# Patient Record
Sex: Female | Born: 1990
Health system: Southern US, Community
[De-identification: ages and names within clinical notes are randomized; demographics above are authoritative.]

## PROBLEM LIST (undated history)

## (undated) DIAGNOSIS — I499 Cardiac arrhythmia, unspecified: Secondary | ICD-10-CM

## (undated) DIAGNOSIS — F32A Depression, unspecified: Secondary | ICD-10-CM

## (undated) DIAGNOSIS — E282 Polycystic ovarian syndrome: Secondary | ICD-10-CM

## (undated) DIAGNOSIS — E119 Type 2 diabetes mellitus without complications: Secondary | ICD-10-CM

## (undated) DIAGNOSIS — D649 Anemia, unspecified: Secondary | ICD-10-CM

## (undated) DIAGNOSIS — E079 Disorder of thyroid, unspecified: Secondary | ICD-10-CM

## (undated) DIAGNOSIS — F419 Anxiety disorder, unspecified: Secondary | ICD-10-CM

## (undated) DIAGNOSIS — K219 Gastro-esophageal reflux disease without esophagitis: Secondary | ICD-10-CM

## (undated) DIAGNOSIS — R002 Palpitations: Secondary | ICD-10-CM

## (undated) DIAGNOSIS — E039 Hypothyroidism, unspecified: Secondary | ICD-10-CM

## (undated) DIAGNOSIS — R12 Heartburn: Secondary | ICD-10-CM

## (undated) DIAGNOSIS — S83519A Sprain of anterior cruciate ligament of unspecified knee, initial encounter: Secondary | ICD-10-CM

## (undated) DIAGNOSIS — R51 Headache: Secondary | ICD-10-CM

## (undated) HISTORY — DX: Palpitations: R00.2

## (undated) HISTORY — DX: Hypothyroidism, unspecified: E03.9

## (undated) HISTORY — DX: Depression, unspecified: F32.A

## (undated) HISTORY — DX: Type 2 diabetes mellitus without complications: E11.9

## (undated) HISTORY — DX: Disorder of thyroid, unspecified: E07.9

## (undated) HISTORY — PX: WISDOM TOOTH EXTRACTION: SHX21

## (undated) HISTORY — DX: Anxiety disorder, unspecified: F41.9

## (undated) HISTORY — DX: Heartburn: R12

---
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
## 2012-01-04 ENCOUNTER — Encounter: Payer: Self-pay | Admitting: *Deleted

## 2012-01-04 ENCOUNTER — Ambulatory Visit (INDEPENDENT_AMBULATORY_CARE_PROVIDER_SITE_OTHER): Payer: Medicaid Other | Admitting: Internal Medicine

## 2012-01-04 ENCOUNTER — Telehealth: Payer: Self-pay | Admitting: Internal Medicine

## 2012-01-04 ENCOUNTER — Encounter: Payer: Self-pay | Admitting: Internal Medicine

## 2012-01-04 DIAGNOSIS — G809 Cerebral palsy, unspecified: Secondary | ICD-10-CM

## 2012-01-04 DIAGNOSIS — Z8249 Family history of ischemic heart disease and other diseases of the circulatory system: Secondary | ICD-10-CM

## 2012-01-04 DIAGNOSIS — R079 Chest pain, unspecified: Secondary | ICD-10-CM | POA: Insufficient documentation

## 2012-01-04 DIAGNOSIS — R002 Palpitations: Secondary | ICD-10-CM

## 2012-01-04 DIAGNOSIS — I2510 Atherosclerotic heart disease of native coronary artery without angina pectoris: Secondary | ICD-10-CM

## 2012-01-04 NOTE — Assessment & Plan Note (Addendum)
Chst pain appears to be musculoskeletal.  I am not convinced of cardiac component. With mothers history I would recomm an echo to rule out familial problem.  Not related to pain Mother's name:  Dimas Chyle

## 2012-01-04 NOTE — Progress Notes (Signed)
HPI Patient is a 21 year old who was referred from the ER for evaluation of CP. The patient says over the past month she has had SSCP, breast pain.Pain is rel constant, aching.  Worse with deep breath at times.  Not positional.  Not exacerbatice by exertion ER note did document bruising across chest. Impression that pain was musculoskel in origin  Rec:  Ibuprofen. Since seen she continues to have.  No signif change. No SOB with activity. Notes some palpitations.  Isolated skips.  She has cut back on caffeine.  No dizziness. No SOB with events. She denies injury to chest.  Mother with history of cardiomyopathy.  Has ICD. No Known Allergies   Current Outpatient Prescriptions  Medication Sig Dispense Refill  . ibuprofen (ADVIL,MOTRIN) 800 MG tablet Take 1 tablet (800 mg total) by mouth 3 (three) times daily.  30 tablet  0    Past Medical History  Diagnosis Date  . Migraine     No past surgical history on file.  Family History  Problem Relation Age of Onset  . Hypertension Other   . Diabetes Other   . Cardiomyopathy Other     History   Social History  . Marital Status: Single    Spouse Name: N/A    Number of Children: N/A  . Years of Education: N/A   Occupational History  . Not on file.   Social History Main Topics  . Smoking status: Never Smoker   . Smokeless tobacco: Not on file  . Alcohol Use: No  . Drug Use: No  . Sexually Active:    Other Topics Concern  . Not on file   Social History Narrative  . No narrative on file    Review of Systems:  All systems reviewed.  They are negative to the above problem except as previously stated.  Vital Signs: BP 120/74  Pulse 60  Ht 5\' 6"  (1.676 m)  Wt 216 lb (97.977 kg)  BMI 34.86 kg/m2  LMP 12/20/2011  Physical Exam Patient in NAD  HEENT:  Normocephalic, atraumatic. EOMI, PERRLA.  Neck: JVP is normal. No thyromegaly. No bruits.  Lungs: clear to auscultation. No rales no wheezes.  Heart: Regular rate and  rhythm. Normal S1, S2. No S3.   No significant murmurs. PMI not displaced. Chest:  Old bruises on breasts.  Tender to palpation of breast and substernal area.  Abdomen:  Supple, nontender. Normal bowel sounds. No masses. No hepatomegaly.  Extremities:   Good distal pulses throughout. No lower extremity edema.  Musculoskeletal :moving all extremities.  Neuro:   alert and oriented x3.  CN II-XII grossly intact.  EKG:  SB.  57.   (12/28/11)  Assessment and Plan:

## 2012-01-04 NOTE — Patient Instructions (Addendum)
Schedule ECHO Heart Ultrasound in 1 month.  Your physician has requested that you have an echocardiogram. Echocardiography is a painless test that uses sound waves to create images of your heart. It provides your doctor with information about the size and shape of your heart and how well your heart's chambers and valves are working. This procedure takes approximately one hour. There are no restrictions for this procedure.

## 2012-01-04 NOTE — Assessment & Plan Note (Signed)
Sound like isolated skips.  Agree with avoiding stimulants.

## 2012-01-04 NOTE — Telephone Encounter (Signed)
Note faxed to her school.

## 2012-01-04 NOTE — Telephone Encounter (Signed)
New msg Patient was here today and she needs a school note faxed to her school at [phone].

## 2012-01-31 ENCOUNTER — Other Ambulatory Visit (HOSPITAL_COMMUNITY): Payer: Medicaid Other

## 2012-01-31 ENCOUNTER — Ambulatory Visit (HOSPITAL_COMMUNITY): Payer: Medicaid Other | Attending: Cardiology

## 2012-01-31 ENCOUNTER — Other Ambulatory Visit: Payer: BC Managed Care – PPO

## 2012-01-31 DIAGNOSIS — R002 Palpitations: Secondary | ICD-10-CM | POA: Insufficient documentation

## 2012-01-31 DIAGNOSIS — R079 Chest pain, unspecified: Secondary | ICD-10-CM | POA: Insufficient documentation

## 2012-01-31 DIAGNOSIS — Z8249 Family history of ischemic heart disease and other diseases of the circulatory system: Secondary | ICD-10-CM

## 2012-01-31 DIAGNOSIS — I2510 Atherosclerotic heart disease of native coronary artery without angina pectoris: Secondary | ICD-10-CM

## 2012-01-31 DIAGNOSIS — R072 Precordial pain: Secondary | ICD-10-CM

## 2014-05-12 ENCOUNTER — Emergency Department (HOSPITAL_COMMUNITY): Payer: BC Managed Care – PPO

## 2014-05-12 ENCOUNTER — Emergency Department (HOSPITAL_COMMUNITY)
Admission: EM | Admit: 2014-05-12 | Discharge: 2014-05-12 | Disposition: A | Discharge status: Home / Self Care | Payer: BC Managed Care – PPO | Attending: Emergency Medicine | Admitting: Emergency Medicine

## 2014-05-12 DIAGNOSIS — M25562 Pain in left knee: Secondary | ICD-10-CM

## 2014-05-12 DIAGNOSIS — S8990XA Unspecified injury of unspecified lower leg, initial encounter: Secondary | ICD-10-CM | POA: Insufficient documentation

## 2014-05-12 DIAGNOSIS — S83519A Sprain of anterior cruciate ligament of unspecified knee, initial encounter: Secondary | ICD-10-CM

## 2014-05-12 DIAGNOSIS — S99919A Unspecified injury of unspecified ankle, initial encounter: Secondary | ICD-10-CM | POA: Insufficient documentation

## 2014-05-12 DIAGNOSIS — S99929A Unspecified injury of unspecified foot, initial encounter: Principal | ICD-10-CM

## 2014-05-12 DIAGNOSIS — Y9344 Activity, trampolining: Secondary | ICD-10-CM | POA: Insufficient documentation

## 2014-05-12 DIAGNOSIS — Z8679 Personal history of other diseases of the circulatory system: Secondary | ICD-10-CM | POA: Insufficient documentation

## 2014-05-12 DIAGNOSIS — X500XXA Overexertion from strenuous movement or load, initial encounter: Secondary | ICD-10-CM | POA: Insufficient documentation

## 2014-05-12 DIAGNOSIS — Y92838 Other recreation area as the place of occurrence of the external cause: Secondary | ICD-10-CM

## 2014-05-12 DIAGNOSIS — Y9239 Other specified sports and athletic area as the place of occurrence of the external cause: Secondary | ICD-10-CM | POA: Insufficient documentation

## 2014-05-12 DIAGNOSIS — Z8739 Personal history of other diseases of the musculoskeletal system and connective tissue: Secondary | ICD-10-CM | POA: Insufficient documentation

## 2014-05-12 HISTORY — DX: Sprain of anterior cruciate ligament of unspecified knee, initial encounter: S83.519A

## 2014-05-12 MED ORDER — HYDROCODONE-ACETAMINOPHEN 5-325 MG PO TABS: 1.0000 | ORAL_TABLET | Freq: Four times a day (QID) | ORAL | Status: DC | PRN

## 2014-05-12 MED ORDER — ONDANSETRON HCL 4 MG PO TABS: 4.0000 mg | ORAL_TABLET | Freq: Four times a day (QID) | ORAL | Status: DC

## 2014-05-12 MED ORDER — ONDANSETRON 8 MG PO TBDP
8.0000 mg | ORAL_TABLET | Freq: Once | ORAL | Status: AC
  Administered 2014-05-12: 8 mg via ORAL
  Filled 2014-05-12: qty 1

## 2014-05-12 MED ORDER — HYDROCODONE-ACETAMINOPHEN 5-325 MG PO TABS
2.0000 | ORAL_TABLET | Freq: Once | ORAL | Status: AC
  Administered 2014-05-12: 2 via ORAL
  Filled 2014-05-12: qty 2

## 2014-05-12 NOTE — Discharge Instructions (Signed)
Knee Pain °Knee pain can be a result of an injury or other medical conditions. Treatment will depend on the cause of your pain. °HOME CARE °· Only take medicine as told by your doctor. °· Keep a healthy weight. Being overweight can make the knee hurt more. °· Stretch before exercising or playing sports. °· If there is constant knee pain, change the way you exercise. Ask your doctor for advice. °· Make sure shoes fit well. Choose the right shoe for the sport or activity. °· Protect your knees. Wear kneepads if needed. °· Rest when you are tired. °GET HELP RIGHT AWAY IF:  °· Your knee pain does not stop. °· Your knee pain does not get better. °· Your knee joint feels hot to the touch. °· You have a fever. °MAKE SURE YOU:  °· Understand these instructions. °· Will watch this condition. °· Will get help right away if you are not doing well or get worse. °Document Released: 02/09/2009 Document Revised: 02/05/2012 Document Reviewed: 02/09/2009 °[employer]® Patient Information ©2015 [employer]. This information is not intended to replace advice given to you by your health care provider. Make sure you discuss any questions you have with your health care provider. ° °

## 2014-05-12 NOTE — ED Notes (Signed)
Bed: [id number] Expected date:  Expected time:  Means of arrival:  Comments: EMS knee injury

## 2014-05-12 NOTE — ED Notes (Signed)
Patient returned from radiology. Patient remains in NAD

## 2014-05-12 NOTE — ED Notes (Signed)
Patient transported to radiology via stretcher Patient in NAD upon leaving room for testing

## 2014-05-12 NOTE — ED Provider Notes (Signed)
CSN: [id number]     Arrival date & time 05/12/14  2153 History   First MD Initiated Contact with Patient 05/12/14 2155     Chief Complaint  Patient presents with  . Knee Pain     (Consider location/radiation/quality/duration/timing/severity/associated sxs/prior Treatment) HPI Comments: The patient is an otherwise healthy 23 year old female who presents today with left knee pain. She states that the pain began just prior to arrival when she was jumping on a trampoline. She jumped up and felt pain. She states she felt a pop in her left knee. She has been unable to bend her left knee since this occurred. She has been unable to ambulate since this occurred as this worsens her pain. She does have history of tendinitis in this knee. She has never had any prior surgeries on that knee. She has some tingling in the knee. She has not tried anything for pain. No loss of consciousness or any other injuries during this accident.   The history is provided by the patient. No language interpreter was used.    Past Medical History  Diagnosis Date  . Migraine    No past surgical history on file. Family History  Problem Relation Age of Onset  . Hypertension Other   . Diabetes Other   . Cardiomyopathy Other    History  Substance Use Topics  . Smoking status: Never Smoker   . Smokeless tobacco: Not on file  . Alcohol Use: No   OB History   Grav Para Term Preterm Abortions TAB SAB Ect Mult Living                 Review of Systems  Musculoskeletal: Positive for arthralgias, gait problem, joint swelling and myalgias.  Neurological: Negative for syncope.  All other systems reviewed and are negative.     Allergies  Review of patient's allergies indicates no known allergies.  Home Medications   Prior to Admission medications   Medication Sig Start Date End Date Taking? Authorizing Provider  acetaminophen (TYLENOL) 500 MG tablet Take 1,500 mg by mouth every 6 (six) hours as needed (for  pain.).   Yes Historical Provider, MD  ibuprofen (ADVIL,MOTRIN) 200 MG tablet Take 400-600 mg by mouth every 6 (six) hours as needed (for pain.).   Yes Historical Provider, MD   BP 127/63  Pulse 90  Temp(Src) 98.4 F (36.9 C) (Oral)  Resp 18  SpO2 98% Physical Exam  Nursing note and vitals reviewed. Constitutional: She is oriented to person, place, and time. She appears well-developed and well-nourished. No distress.  HENT:  Head: Normocephalic and atraumatic.  Right Ear: External ear normal.  Left Ear: External ear normal.  Nose: Nose normal.  Mouth/Throat: Oropharynx is clear and moist.  Eyes: Conjunctivae are normal.  Neck: Normal range of motion.  Cardiovascular: Normal rate, regular rhythm, normal heart sounds, intact distal pulses and normal pulses.   Pulses:      Dorsalis pedis pulses are 2+ on the right side.       Posterior tibial pulses are 2+ on the right side.  Capillary refill less than 3 seconds in all toes.  Pulmonary/Chest: Effort normal and breath sounds normal. No stridor. No respiratory distress. She has no wheezes. She has no rales.  Abdominal: Soft. She exhibits no distension.  Musculoskeletal: Normal range of motion.  Tenderness to palpation over medial, anterior, lateral aspect of the left knee. Patient keeps the knee in flexion. Patient resists any extension. Joint stable. I do not appreciate any  joint effusion. There is no bruising or abrasions. Compartment soft. Neurovascularly intact.  Neurological: She is alert and oriented to person, place, and time. She has normal strength.  Skin: Skin is warm and dry. She is not diaphoretic. No erythema.  Psychiatric: She has a normal mood and affect. Her behavior is normal.    ED Course  Procedures (including critical care time) Labs Review Labs Reviewed - No data to display  Imaging Review Dg Knee Complete 4 Views Left  05/12/2014   CLINICAL DATA:  Trampoline injury.  EXAM: LEFT KNEE - COMPLETE 4+ VIEW   COMPARISON:  None.  FINDINGS: There is no evidence of fracture, dislocation, or joint effusion. There is no evidence of arthropathy or other focal bone abnormality. Soft tissues are unremarkable.  IMPRESSION: Negative.   Electronically Signed   By: Awilda Metroourtnay  Bloomer   On: 05/12/2014 22:20     EKG Interpretation None      MDM   Final diagnoses:  Left knee pain    Patient presents to the emergency department with left knee pain after jumping on a trampoline. There is no deformity. Joints stable, compartment soft, neurovascularly intact. X-rays negative for acute findings. At this time I do not believe there is ligamentous injury. No concern for patellar subluxation. Patient was given pain medicine and instructed to rest, ice, elevate. She was given crutches and a knee immobilizer. She was given orthopedic followup as needed. A work note was provided for the patient. Discussed reasons to return to the emergency department immediately. Vital signs stable for discharge. Patient / Family / Caregiver informed of clinical course, understand medical decision-making process, and agree with plan.     Mora BellmanHannah S Veanna Dower, PA-C 05/12/14 2329

## 2014-05-12 NOTE — ED Notes (Signed)
Per PTAR: Pt was jumping on trampoline, felt a pop in L knee, pt unable to ambulate since the injury. No deformity noted. Good pedal pulses. No bleeding or bruising.

## 2014-05-12 NOTE — ED Notes (Signed)
Ortho tech at bedside 

## 2014-05-16 NOTE — ED Provider Notes (Signed)
Medical screening examination/treatment/procedure(s) were performed by non-physician practitioner and as supervising physician I was immediately available for consultation/collaboration.   EKG Interpretation None        Rolland PorterMark Monque Haggar, MD 05/16/14 0730

## 2014-06-04 ENCOUNTER — Encounter (HOSPITAL_BASED_OUTPATIENT_CLINIC_OR_DEPARTMENT_OTHER): Payer: Self-pay | Admitting: *Deleted

## 2014-06-04 ENCOUNTER — Other Ambulatory Visit: Payer: Self-pay | Admitting: Orthopedic Surgery

## 2014-06-05 ENCOUNTER — Encounter (HOSPITAL_BASED_OUTPATIENT_CLINIC_OR_DEPARTMENT_OTHER): Payer: Self-pay | Admitting: Anesthesiology

## 2014-06-05 ENCOUNTER — Ambulatory Visit (HOSPITAL_BASED_OUTPATIENT_CLINIC_OR_DEPARTMENT_OTHER)
Admission: RE | Admit: 2014-06-05 | Discharge: 2014-06-05 | Disposition: A | Discharge status: Home / Self Care | Payer: BC Managed Care – PPO | Source: Ambulatory Visit | Attending: Orthopedic Surgery | Admitting: Orthopedic Surgery

## 2014-06-05 ENCOUNTER — Encounter (HOSPITAL_BASED_OUTPATIENT_CLINIC_OR_DEPARTMENT_OTHER): Payer: BC Managed Care – PPO | Admitting: Anesthesiology

## 2014-06-05 ENCOUNTER — Ambulatory Visit (HOSPITAL_BASED_OUTPATIENT_CLINIC_OR_DEPARTMENT_OTHER): Payer: BC Managed Care – PPO | Admitting: Anesthesiology

## 2014-06-05 ENCOUNTER — Encounter (HOSPITAL_BASED_OUTPATIENT_CLINIC_OR_DEPARTMENT_OTHER)
Admission: RE | Disposition: A | Discharge status: Home / Self Care | Payer: Self-pay | Source: Ambulatory Visit | Attending: Orthopedic Surgery

## 2014-06-05 DIAGNOSIS — X58XXXA Exposure to other specified factors, initial encounter: Secondary | ICD-10-CM

## 2014-06-05 DIAGNOSIS — R51 Headache: Secondary | ICD-10-CM | POA: Insufficient documentation

## 2014-06-05 DIAGNOSIS — Y9344 Activity, trampolining: Secondary | ICD-10-CM | POA: Insufficient documentation

## 2014-06-05 DIAGNOSIS — M2240 Chondromalacia patellae, unspecified knee: Secondary | ICD-10-CM | POA: Insufficient documentation

## 2014-06-05 DIAGNOSIS — E282 Polycystic ovarian syndrome: Secondary | ICD-10-CM | POA: Insufficient documentation

## 2014-06-05 DIAGNOSIS — S83509A Sprain of unspecified cruciate ligament of unspecified knee, initial encounter: Secondary | ICD-10-CM | POA: Insufficient documentation

## 2014-06-05 DIAGNOSIS — X503XXA Overexertion from repetitive movements, initial encounter: Secondary | ICD-10-CM | POA: Insufficient documentation

## 2014-06-05 HISTORY — DX: Anemia, unspecified: D64.9

## 2014-06-05 HISTORY — DX: Sprain of anterior cruciate ligament of unspecified knee, initial encounter: S83.519A

## 2014-06-05 HISTORY — DX: Polycystic ovarian syndrome: E28.2

## 2014-06-05 HISTORY — DX: Headache: R51

## 2014-06-05 HISTORY — PX: KNEE ARTHROSCOPY WITH ANTERIOR CRUCIATE LIGAMENT (ACL) REPAIR: SHX5644

## 2014-06-05 LAB — POCT HEMOGLOBIN-HEMACUE: Hemoglobin: 13.8 g/dL (ref 12.0–15.0)

## 2014-06-05 SURGERY — KNEE ARTHROSCOPY WITH ANTERIOR CRUCIATE LIGAMENT (ACL) REPAIR
Anesthesia: General | Site: Knee | Laterality: Left

## 2014-06-05 MED ORDER — PROPOFOL 10 MG/ML IV BOLUS
INTRAVENOUS | Status: DC | PRN
  Administered 2014-06-05: 200 mg via INTRAVENOUS

## 2014-06-05 MED ORDER — CEFAZOLIN SODIUM-DEXTROSE 2-3 GM-% IV SOLR
INTRAVENOUS | Status: AC
  Filled 2014-06-05: qty 50

## 2014-06-05 MED ORDER — MIDAZOLAM HCL 2 MG/2ML IJ SOLN
INTRAMUSCULAR | Status: AC
  Filled 2014-06-05: qty 2

## 2014-06-05 MED ORDER — GLYCOPYRROLATE 0.2 MG/ML IJ SOLN
INTRAMUSCULAR | Status: DC | PRN
  Administered 2014-06-05: 0.2 mg via INTRAVENOUS

## 2014-06-05 MED ORDER — MIDAZOLAM HCL 2 MG/2ML IJ SOLN
1.0000 mg | INTRAMUSCULAR | Status: DC | PRN
  Administered 2014-06-05: 2 mg via INTRAVENOUS

## 2014-06-05 MED ORDER — MIDAZOLAM HCL 2 MG/ML PO SYRP: 12.0000 mg | ORAL_SOLUTION | Freq: Once | ORAL | Status: DC | PRN

## 2014-06-05 MED ORDER — ONDANSETRON HCL 4 MG/2ML IJ SOLN: 4.0000 mg | Freq: Once | INTRAMUSCULAR | Status: DC | PRN

## 2014-06-05 MED ORDER — EPINEPHRINE HCL 1 MG/ML IJ SOLN
INTRAMUSCULAR | Status: DC | PRN
  Administered 2014-06-05: 2 mg

## 2014-06-05 MED ORDER — FENTANYL CITRATE 0.05 MG/ML IJ SOLN
INTRAMUSCULAR | Status: DC | PRN
  Administered 2014-06-05 (×6): 25 ug via INTRAVENOUS

## 2014-06-05 MED ORDER — OXYCODONE HCL 5 MG/5ML PO SOLN: 5.0000 mg | Freq: Once | ORAL | Status: AC | PRN

## 2014-06-05 MED ORDER — FENTANYL CITRATE 0.05 MG/ML IJ SOLN
50.0000 ug | INTRAMUSCULAR | Status: DC | PRN
  Administered 2014-06-05: 100 ug via INTRAVENOUS

## 2014-06-05 MED ORDER — SODIUM CHLORIDE 0.9 % IR SOLN
Status: DC | PRN
  Administered 2014-06-05: 15000 mL

## 2014-06-05 MED ORDER — DEXAMETHASONE SODIUM PHOSPHATE 4 MG/ML IJ SOLN
INTRAMUSCULAR | Status: DC | PRN
  Administered 2014-06-05: 10 mg via INTRAVENOUS

## 2014-06-05 MED ORDER — HYDROMORPHONE HCL PF 1 MG/ML IJ SOLN
INTRAMUSCULAR | Status: AC
  Filled 2014-06-05: qty 1

## 2014-06-05 MED ORDER — FENTANYL CITRATE 0.05 MG/ML IJ SOLN
INTRAMUSCULAR | Status: AC
  Filled 2014-06-05: qty 2

## 2014-06-05 MED ORDER — OXYCODONE HCL 5 MG PO TABS
ORAL_TABLET | ORAL | Status: AC
  Filled 2014-06-05: qty 1

## 2014-06-05 MED ORDER — BUPIVACAINE HCL (PF) 0.5 % IJ SOLN
INTRAMUSCULAR | Status: AC
  Filled 2014-06-05: qty 30

## 2014-06-05 MED ORDER — LIDOCAINE HCL (CARDIAC) 20 MG/ML IV SOLN
INTRAVENOUS | Status: DC | PRN
  Administered 2014-06-05: 80 mg via INTRAVENOUS

## 2014-06-05 MED ORDER — OXYCODONE-ACETAMINOPHEN 5-325 MG PO TABS: 1.0000 | ORAL_TABLET | Freq: Four times a day (QID) | ORAL | Status: DC | PRN

## 2014-06-05 MED ORDER — BUPIVACAINE-EPINEPHRINE (PF) 0.5% -1:200000 IJ SOLN
INTRAMUSCULAR | Status: AC
  Filled 2014-06-05: qty 30

## 2014-06-05 MED ORDER — HYDROMORPHONE HCL PF 1 MG/ML IJ SOLN
0.2500 mg | INTRAMUSCULAR | Status: DC | PRN
  Administered 2014-06-05: 0.5 mg via INTRAVENOUS

## 2014-06-05 MED ORDER — CHLORHEXIDINE GLUCONATE 4 % EX LIQD: 60.0000 mL | Freq: Once | CUTANEOUS | Status: DC

## 2014-06-05 MED ORDER — CEFAZOLIN SODIUM-DEXTROSE 2-3 GM-% IV SOLR
2.0000 g | INTRAVENOUS | Status: AC
  Administered 2014-06-05: 2 g via INTRAVENOUS

## 2014-06-05 MED ORDER — OXYCODONE HCL 5 MG PO TABS
5.0000 mg | ORAL_TABLET | Freq: Once | ORAL | Status: AC | PRN
  Administered 2014-06-05: 5 mg via ORAL

## 2014-06-05 MED ORDER — FENTANYL CITRATE 0.05 MG/ML IJ SOLN
INTRAMUSCULAR | Status: AC
  Filled 2014-06-05: qty 6

## 2014-06-05 MED ORDER — ONDANSETRON HCL 4 MG/2ML IJ SOLN
INTRAMUSCULAR | Status: DC | PRN
  Administered 2014-06-05: 4 mg via INTRAVENOUS

## 2014-06-05 MED ORDER — 0.9 % SODIUM CHLORIDE (POUR BTL) OPTIME
TOPICAL | Status: DC | PRN
  Administered 2014-06-05: 1000 mL

## 2014-06-05 MED ORDER — EPINEPHRINE HCL 1 MG/ML IJ SOLN
INTRAMUSCULAR | Status: AC
  Filled 2014-06-05: qty 1

## 2014-06-05 MED ORDER — LACTATED RINGERS IV SOLN
INTRAVENOUS | Status: DC
  Administered 2014-06-05 (×2): via INTRAVENOUS

## 2014-06-05 SURGICAL SUPPLY — 77 items
APL SKNCLS STERI-STRIP NONHPOA (GAUZE/BANDAGES/DRESSINGS) ×1
APPLICATOR COTTON TIP 6IN STRL (MISCELLANEOUS) ×1 IMPLANT
BANDAGE ESMARK 6X9 LF (GAUZE/BANDAGES/DRESSINGS) IMPLANT
BENZOIN TINCTURE PRP APPL 2/3 (GAUZE/BANDAGES/DRESSINGS) ×2 IMPLANT
BLADE 4.2CUDA (BLADE) IMPLANT
BLADE AVERAGE 25X9 (BLADE) ×2 IMPLANT
BLADE CUDA 5.5 (BLADE) IMPLANT
BLADE CUTTER GATOR 3.5 (BLADE) IMPLANT
BLADE GREAT WHITE 4.2 (BLADE) ×2 IMPLANT
BLADE OSCIL/SAGITTAL W/10 ST (BLADE) IMPLANT
BLADE SURG 15 STRL LF DISP TIS (BLADE) ×1 IMPLANT
BLADE SURG 15 STRL SS (BLADE) ×2
BNDG CMPR 9X6 STRL LF SNTH (GAUZE/BANDAGES/DRESSINGS)
BNDG ESMARK 6X9 LF (GAUZE/BANDAGES/DRESSINGS)
BUR OVAL 4.0 (BURR) ×2 IMPLANT
CANISTER SUCT 3000ML (MISCELLANEOUS) IMPLANT
COVER TABLE BACK 60X90 (DRAPES) ×2 IMPLANT
DRAPE ARTHROSCOPY W/POUCH 114 (DRAPES) ×2 IMPLANT
DRSG EMULSION OIL 3X3 NADH (GAUZE/BANDAGES/DRESSINGS) ×2 IMPLANT
DURAPREP 26ML APPLICATOR (WOUND CARE) ×2 IMPLANT
ELECT MENISCUS 165MM 90D (ELECTRODE) IMPLANT
ELECT REM PT RETURN 9FT ADLT (ELECTROSURGICAL)
ELECTRODE REM PT RTRN 9FT ADLT (ELECTROSURGICAL) IMPLANT
GAUZE SPONGE 4X4 12PLY STRL (GAUZE/BANDAGES/DRESSINGS) ×2 IMPLANT
GLOVE BIO SURGEON STRL SZ7.5 (GLOVE) ×1 IMPLANT
GLOVE BIOGEL PI IND STRL 7.0 (GLOVE) IMPLANT
GLOVE BIOGEL PI IND STRL 8 (GLOVE) ×2 IMPLANT
GLOVE BIOGEL PI INDICATOR 7.0 (GLOVE) ×1
GLOVE BIOGEL PI INDICATOR 8 (GLOVE) ×2
GLOVE ECLIPSE 6.5 STRL STRAW (GLOVE) ×1 IMPLANT
GLOVE ECLIPSE 7.5 STRL STRAW (GLOVE) ×4 IMPLANT
GLOVE EXAM NITRILE PF MED BLUE (GLOVE) ×1 IMPLANT
GLOVE SURG SS PI 7.0 STRL IVOR (GLOVE) ×1 IMPLANT
GOWN STRL REUS W/ TWL LRG LVL3 (GOWN DISPOSABLE) ×1 IMPLANT
GOWN STRL REUS W/ TWL XL LVL3 (GOWN DISPOSABLE) ×1 IMPLANT
GOWN STRL REUS W/TWL LRG LVL3 (GOWN DISPOSABLE) ×2
GOWN STRL REUS W/TWL XL LVL3 (GOWN DISPOSABLE) ×5 IMPLANT
HOLDER KNEE FOAM BLUE (MISCELLANEOUS) ×2 IMPLANT
IMMOBILIZER KNEE 22 UNIV (SOFTGOODS) ×1 IMPLANT
IMMOBILIZER KNEE 24 THIGH 36 (MISCELLANEOUS) IMPLANT
IMMOBILIZER KNEE 24 UNIV (MISCELLANEOUS)
IV NS IRRIG 3000ML ARTHROMATIC (IV SOLUTION) ×2 IMPLANT
KIT TRANSTIBIAL (DISPOSABLE) ×2 IMPLANT
KNEE WRAP E Z 3 GEL PACK (MISCELLANEOUS) ×2 IMPLANT
KNIFE GRAFT ACL 10MM 5952 (MISCELLANEOUS) IMPLANT
KNIFE GRAFT ACL 11MM (MISCELLANEOUS) ×1 IMPLANT
MANIFOLD NEPTUNE II (INSTRUMENTS) ×2 IMPLANT
NDL SAFETY ECLIPSE 18X1.5 (NEEDLE) ×2 IMPLANT
NEEDLE HYPO 18GX1.5 SHARP (NEEDLE) ×4
PACK ARTHROSCOPY DSU (CUSTOM PROCEDURE TRAY) ×2 IMPLANT
PACK BASIN DAY SURGERY FS (CUSTOM PROCEDURE TRAY) ×2 IMPLANT
PAD CAST 4YDX4 CTTN HI CHSV (CAST SUPPLIES) ×2 IMPLANT
PADDING CAST COTTON 4X4 STRL (CAST SUPPLIES) ×4
PASSER SUT SWANSON 36MM LOOP (INSTRUMENTS) IMPLANT
PENCIL BUTTON HOLSTER BLD 10FT (ELECTRODE) IMPLANT
SCREW INTERFERENCE 7X20MM (Screw) ×1 IMPLANT
SCREW SHEATHED INTERF 7X25 (Screw) ×1 IMPLANT
SET ARTHROSCOPY TUBING (MISCELLANEOUS) ×2
SET ARTHROSCOPY TUBING LN (MISCELLANEOUS) ×1 IMPLANT
SHEET MEDIUM DRAPE 40X70 STRL (DRAPES) ×2 IMPLANT
SPONGE LAP 4X18 X RAY DECT (DISPOSABLE) ×2 IMPLANT
STRIP CLOSURE SKIN 1/2X4 (GAUZE/BANDAGES/DRESSINGS) ×2 IMPLANT
SUCTION FRAZIER TIP 10 FR DISP (SUCTIONS) ×2 IMPLANT
SUT ETHILON 4 0 PS 2 18 (SUTURE) IMPLANT
SUT MNCRL AB 3-0 PS2 18 (SUTURE) ×2 IMPLANT
SUT PDS AB 1 CT  36 (SUTURE) ×2
SUT PDS AB 1 CT 36 (SUTURE) ×2 IMPLANT
SUT STEEL 5 (SUTURE) ×2 IMPLANT
SUT TICRON 1 T 12 (SUTURE) ×1 IMPLANT
SUT VIC AB 0 CT1 27 (SUTURE) ×2
SUT VIC AB 0 CT1 27XBRD ANBCTR (SUTURE) IMPLANT
SUT VIC AB 2-0 SH 27 (SUTURE) ×2
SUT VIC AB 2-0 SH 27XBRD (SUTURE) IMPLANT
SYRINGE 6CC (MISCELLANEOUS) ×2 IMPLANT
TOWEL OR 17X24 6PK STRL BLUE (TOWEL DISPOSABLE) ×6 IMPLANT
TOWEL OR NON WOVEN STRL DISP B (DISPOSABLE) ×2 IMPLANT
WATER STERILE IRR 1000ML POUR (IV SOLUTION) ×3 IMPLANT

## 2014-06-05 NOTE — Discharge Instructions (Signed)
POST-OP ACL RECONSTRUCTION  1. Come to office Monday at 9 am for follow up appt  2. Leave the steri-strips in place over your incisions when performing dressing changes and showering. Remove your dressings tomorrow to perform first dressings change. Then change dressing daily or as needed. You may shower and get incision wet on day 5 from surgery. Do not submerge incision in tub or under water.  3. Wear your knee immobilizer or hinged knee brace to sleep and at all times while up. You may put full weight on operative leg with brace on. Crutches are for comfort. Remove brace to perform attached exercises.  4. It is strongly recommended to ice and elevate your leg on a regular basis and at a minimum of 4 times a day for 30 minute sessions. You should ice after your exercise sessions and more if you are having a lot of pain or increase in swelling.  5. The thigh high Ted hose (white stockings) help to decrease swelling and prevent blood clots. It is recommended that you wear them on both legs for the first 3 days. Then you should wear on the operative extremity when upright but can remove to sleep.  6. If you had a block pre-operatively to provide post-op pain relief you may want to go ahead and begin utilizing your pain meds as your arm begins to wake up. Blocks can sometimes last up to 16-18 hours. If you are still pain-free prior to going to bed you may want to strongly consider taking a pain medication to avoid being awakened in the night with the onset of pain. A muscle relaxant is also provided for you should you experience muscle spasms. It is recommended that if you are experiencing pain that your pain medication alone is not controlling, add the muscle relaxant along with the pain medication which can give additional pain relief. The first one to two days is generally the most severe of your pain and then should gradually decrease. As your pain lessens it is recommended that you decrease  your use of the pain medications to an "as needed basis" only and to always comply with the recommended dosages of the pain medications.  7. Pain medications can produce constipation along with their use. If you experience this, the use of an over the counter stool softener or laxative daily is recommended.   8. For additional questions or concerns, please do not hesitate to call the office. If after hours there is an answering service to forward your concerns to the physician on call.   POST-OP EXERCISES  Repeat each exercise 10 times per set. Do 3 sets per session. Do 3 sessions per day.  Ankle/Foot Range of Motion  With leg relaxed, gently flex and extend ankle. Move through full range of motion.     Knee Extension Mobilization: Towel Prop  With a small towel rolled under your ankle, relax your leg to feel a comfortable stretch along the backside of your knee/leg. Hold for 3-5 minutes.     Hip/Knee Strengthening: Quadriceps Set  Tighten your quadriceps (top of thigh) by pulling your patella (kneecap) toward your hip. Keep your buttocks relaxed. Hold for 5-10 seconds.     Hip/Knee Strengthening: Straight Leg Raise  With your brace on, tighten your quadriceps and lift your leg 12-18 inches from surface.     Hip/Knee Stretching: Calf - Towel  Sit with knee straight and towel looped around your foot. Gently pull on towel until  stretch is felt in calf. Hold for 20-30 seconds.     Hip/Knee: Knee Flexion  With a towel around your heel, gently pull knee up with towel until stretch is felt. Hold 20-30 seconds.     Regional Anesthesia Blocks  1. Numbness or the inability to move the "blocked" extremity may last from 3-48 hours after placement. The length of time depends on the medication injected and your individual response to the medication. If the numbness is not going away after 48 hours, call your surgeon.  2. The extremity that is blocked will need to be  protected until the numbness is gone and the  Strength has returned. Because you cannot feel it, you will need to take extra care to avoid injury. Because it may be weak, you may have difficulty moving it or using it. You may not know what position it is in without looking at it while the block is in effect.  3. For blocks in the legs and feet, returning to weight bearing and walking needs to be done carefully. You will need to wait until the numbness is entirely gone and the strength has returned. You should be able to move your leg and foot normally before you try and bear weight or walk. You will need someone to be with you when you first try to ensure you do not fall and possibly risk injury.  4. Bruising and tenderness at the needle site are common side effects and will resolve in a few days.  5. Persistent numbness or new problems with movement should be communicated to the surgeon or the [facility] [phone])/ [facility]  [phone]).  Post Anesthesia Home Care Instructions  Activity: Get plenty of rest for the remainder of the day. A responsible adult should stay with you for 24 hours following the procedure.  For the next 24 hours, DO NOT: -Drive a car -Advertising copywriterperate machinery -Drink alcoholic beverages -Take any medication unless instructed by your physician -Make any legal decisions or sign important papers.  Meals: Start with liquid foods such as gelatin or soup. Progress to regular foods as tolerated. Avoid greasy, spicy, heavy foods. If nausea and/or vomiting occur, drink only clear liquids until the nausea and/or vomiting subsides. Call your physician if vomiting continues.  Special Instructions/Symptoms: Your throat may feel dry or sore from the anesthesia or the breathing tube placed in your throat during surgery. If this causes discomfort, gargle with warm salt water. The discomfort should disappear within 24 hours.

## 2014-06-05 NOTE — Progress Notes (Signed)
Assisted Dr. Crews with left, ultrasound guided, femoral block. Side rails up, monitors on throughout procedure. See vital signs in flow sheet. Tolerated Procedure well. 

## 2014-06-05 NOTE — Brief Op Note (Signed)
06/05/2014  4:41 PM  PATIENT:  Whitney Aguirre  22 y.o. female  PRE-OPERATIVE DIAGNOSIS:  ACL TEAR -LEFT KNEE  POST-OPERATIVE DIAGNOSIS:  ACL TEAR -LEFT KNEE  PROCEDURE:  Procedure(s): LEFT KNEE ARTHROSCOPY WITH ANTERIOR CRUCIATE LIGAMENT (ACL) REPAIR (Left)  SURGEON:  Surgeon(s) and Role:    * Harvie JuniorJohn L Richar Dunklee, MD - Primary  PHYSICIAN ASSISTANT:   ASSISTANTS: bethune   ANESTHESIA:   general  EBL:  Total I/O In: 1000 [I.V.:1000] Out: -   BLOOD ADMINISTERED:none  DRAINS: none   LOCAL MEDICATIONS USED:  NONE  SPECIMEN:  No Specimen  DISPOSITION OF SPECIMEN:  N/A  COUNTS:  YES  TOURNIQUET:   Total Tourniquet Time Documented: Thigh (Left) - 61 minutes Total: Thigh (Left) - 61 minutes   DICTATION: .Other Dictation: Dictation Number [id number]  PLAN OF CARE: Discharge to home after PACU  PATIENT DISPOSITION:  PACU - hemodynamically stable.   Delay start of Pharmacological VTE agent (>24hrs) due to surgical blood loss or risk of bleeding: no

## 2014-06-05 NOTE — Anesthesia Preprocedure Evaluation (Signed)
Anesthesia Evaluation  Patient identified by MRN, date of birth, ID band Patient awake    Reviewed: Allergy & Precautions, H&P , NPO status , Patient's Chart, lab work & pertinent test results  Airway Mallampati: I TM Distance: >3 FB Neck ROM: Full    Dental  (+) Teeth Intact, Dental Advisory Given   Pulmonary  breath sounds clear to auscultation        Cardiovascular Rhythm:Regular Rate:Normal     Neuro/Psych    GI/Hepatic   Endo/Other  Morbid obesity  Renal/GU      Musculoskeletal   Abdominal   Peds  Hematology   Anesthesia Other Findings   Reproductive/Obstetrics                           Anesthesia Physical Anesthesia Plan  ASA: I  Anesthesia Plan: General   Post-op Pain Management:    Induction: Intravenous  Airway Management Planned: LMA  Additional Equipment:   Intra-op Plan:   Post-operative Plan: Extubation in OR  Informed Consent: I have reviewed the patients History and Physical, chart, labs and discussed the procedure including the risks, benefits and alternatives for the proposed anesthesia with the patient or authorized representative who has indicated his/her understanding and acceptance.   Dental advisory given  Plan Discussed with: CRNA, Anesthesiologist and Surgeon  Anesthesia Plan Comments:         Anesthesia Quick Evaluation

## 2014-06-05 NOTE — Transfer of Care (Signed)
Immediate Anesthesia Transfer of Care Note  Patient: Whitney Aguirre  Procedure(s) Performed: Procedure(s): LEFT KNEE ARTHROSCOPY WITH ANTERIOR CRUCIATE LIGAMENT (ACL) REPAIR (Left)  Patient Location: PACU  Anesthesia Type:GA combined with regional for post-op pain  Level of Consciousness: awake, sedated and patient cooperative  Airway & Oxygen Therapy: Patient Spontanous Breathing and Patient connected to face mask oxygen  Post-op Assessment: Report given to PACU RN and Post -op Vital signs reviewed and stable  Post vital signs: Reviewed and stable  Complications: No apparent anesthesia complications

## 2014-06-05 NOTE — H&P (Signed)
PREOPERATIVE H&P  Chief Complaint: l knee instability  HPI: Martie LeeCassie M Lewis is a 23 y.o. female who presents for evaluation of l knee instability. It has been present for 2 weeks and has been worsening. She has failed conservative measures. Pain is rated as moderate.  Past Medical History  Diagnosis Date  . Headache(784.0)     stress  . ACL tear 05/12/2014    injured on trampoline  . Anemia     no current med.  . Polycystic ovarian syndrome     no current med.   Past Surgical History  Procedure Laterality Date  . Wisdom tooth extraction     History   Social History  . Marital Status: Single    Spouse Name: N/A    Number of Children: N/A  . Years of Education: N/A   Social History Main Topics  . Smoking status: Never Smoker   . Smokeless tobacco: Never Used  . Alcohol Use: No  . Drug Use: No  . Sexual Activity: None   Other Topics Concern  . None   Social History Narrative  . None   History reviewed. No pertinent family history. No Known Allergies Prior to Admission medications   Medication Sig Start Date End Date Taking? Authorizing Provider  ibuprofen (ADVIL,MOTRIN) 200 MG tablet Take 400-600 mg by mouth every 6 (six) hours as needed (for pain.).   Yes Historical Provider, MD     Positive ROS: none  All other systems have been reviewed and were otherwise negative with the exception of those mentioned in the HPI and as above.  Physical Exam: Filed Vitals:   06/05/14 1350  BP:   Pulse: 50  Temp:   Resp:     General: Alert, no acute distress Cardiovascular: No pedal edema Respiratory: No cyanosis, no use of accessory musculature GI: No organomegaly, abdomen is soft and non-tender Skin: No lesions in the area of chief complaint Neurologic: Sensation intact distally Psychiatric: Patient is competent for consent with normal mood and affect Lymphatic: No axillary or cervical lymphadenopathy  MUSCULOSKELETAL: l knee: +pivot shift// + lochman  XRAY:   MRI +ACL tear and meniscus ok by bMRI Assessment/Plan: ACL TEAR Plan for Procedure(s): LEFT KNEE ARTHROSCOPY WITH ANTERIOR CRUCIATE LIGAMENT (ACL) REPAIR  The risks benefits and alternatives were discussed with the patient including but not limited to the risks of nonoperative treatment, versus surgical intervention including infection, bleeding, nerve injury, malunion, nonunion, hardware prominence, hardware failure, need for hardware removal, blood clots, cardiopulmonary complications, morbidity, mortality, among others, and they were willing to proceed.  Predicted outcome is good, although there will be at least a six to nine month expected recovery.  Harvie JuniorGRAVES,Deone Leifheit L, MD 06/05/2014 2:18 PM

## 2014-06-05 NOTE — Anesthesia Procedure Notes (Addendum)
Anesthesia Regional Block:  Femoral nerve block  Pre-Anesthetic Checklist: ,, timeout performed, Correct Patient, Correct Site, Correct Laterality, Correct Procedure, Correct Position, site marked, Risks and benefits discussed,  Surgical consent,  Pre-op evaluation,  At surgeon's request and post-op pain management  Laterality: Left and Lower  Prep: chloraprep       Needles:  Injection technique: Single-shot  Needle Type: Echogenic Needle     Needle Length: 9cm 9 cm Needle Gauge: 21 and 21 G    Additional Needles:  Procedures: ultrasound guided (picture in chart) Femoral nerve block Narrative:  Start time: 06/05/2014 1:32 PM End time: 06/05/2014 1:36 PM Injection made incrementally with aspirations every 5 mL.  Performed by: Personally  Anesthesiologist: Sheldon Silvanavid Crews, MD   Anesthesia Regional Block:  Adductor canal block  Pre-Anesthetic Checklist: ,, timeout performed, Correct Patient, Correct Site, Correct Laterality, Correct Procedure, Correct Position, site marked, Risks and benefits discussed,  Surgical consent,  Pre-op evaluation,  At surgeon's request and post-op pain management  Laterality: Left and Lower  Prep: chloraprep       Needles:  Injection technique: Single-shot  Needle Type: Echogenic Needle     Needle Length: 9cm 9 cm Needle Gauge: 21 and 21 G    Additional Needles:  Procedures: ultrasound guided (picture in chart) Adductor canal block Narrative:  Start time: 06/05/2014 1:37 PM End time: 06/05/2014 1:40 PM Injection made incrementally with aspirations every 5 mL.  Performed by: Personally  Anesthesiologist: Sheldon Silvanavid Crews, MD   Procedure Name: LMA Insertion Date/Time: 06/05/2014 2:32 PM Performed by: Gar GibbonKEETON, Ania Levay S Pre-anesthesia Checklist: Patient identified, Emergency Drugs available, Suction available and Patient being monitored Patient Re-evaluated:Patient Re-evaluated prior to inductionOxygen Delivery Method: Circle System  Utilized Preoxygenation: Pre-oxygenation with 100% oxygen Intubation Type: IV induction Ventilation: Mask ventilation without difficulty LMA: LMA inserted LMA Size: 3.0 Number of attempts: 1 Airway Equipment and Method: bite block Placement Confirmation: positive ETCO2 Tube secured with: Tape Dental Injury: Teeth and Oropharynx as per pre-operative assessment

## 2014-06-05 NOTE — Anesthesia Postprocedure Evaluation (Signed)
Anesthesia Post Note  Patient: Whitney Aguirre  Procedure(s) Performed: Procedure(s) (LRB): LEFT KNEE ARTHROSCOPY WITH ANTERIOR CRUCIATE LIGAMENT (ACL) REPAIR (Left)  Anesthesia type: general  Patient location: PACU  Post pain: Pain level controlled  Post assessment: Patient's Cardiovascular Status Stable  Last Vitals:  Filed Vitals:   06/05/14 1715  BP: 119/68  Pulse: 43  Temp:   Resp: 18    Post vital signs: Reviewed and stable  Level of consciousness: sedated  Complications: No apparent anesthesia complications

## 2014-06-08 ENCOUNTER — Encounter (HOSPITAL_BASED_OUTPATIENT_CLINIC_OR_DEPARTMENT_OTHER): Payer: Self-pay | Admitting: Orthopedic Surgery

## 2014-06-08 NOTE — Op Note (Signed)
NAMEARAIYA, TILMON NO.:  [id number]  MEDICAL RECORD NO.:  [id number]  LOCATION:                               FACILITY:  [facility]  PHYSICIAN:  Harvie Junior, M.D.   DATE OF BIRTH:  [birth year]  DATE OF PROCEDURE:  06/05/2014 DATE OF DISCHARGE:  06/05/2014                              OPERATIVE REPORT   PREOPERATIVE DIAGNOSIS:  Anterior cruciate ligament tear, left.  POSTOPERATIVE DIAGNOSES: 1. Anterior cruciate ligament tear, left. 2. Chondromalacia of the medial femoral condyle.  PRINCIPAL PROCEDURES: 1. Anterior cruciate ligament reconstruction, left with central one-     third patellar tendon autograft. 2. Chondroplasty of the medial femoral condyle of a 15 x 15-mm     articular cartilage defect with flaps from injury.  SURGEON:  Harvie Junior, M.D.  ASSISTANT:  Marshia Ly, P.A.  ANESTHESIA:  General.  BRIEF HISTORY:  Ms. Melvyn Neth is a 23 year old female with a history of jumping on a trampoline.  She unfortunately suffered a left knee injury, which by examination was an ACL tear, MRI confirmed.  There was no other meniscal injury by MRI and after discussion of treatment option, she was brought to the operating room for ACL reconstruction.  We talked about graft choices preoperatively.  We felt given her young age and active lifestyle at [employer] would be appropriate, and this was the choice made preoperatively.  DESCRIPTION OF PROCEDURE:  The patient was taken to the operating room, and after adequate anesthesia was obtained with general anesthetic, the patient was placed supine on the operating table.  Left leg was then prepped and draped in usual sterile fashion.  Following this, routine arthroscopic evaluation of the knee revealed that there was unfortunately medial femoral condyle condylar defect about 15 x 15 mm. There were flaps of cartilage, which were raised probably 1.5 mm in depth, this was debrided with a suction shaver carefully not  to get too deep into the area.  Once that was done, attention was then turned to the notch where the ACL was torn off of the femoral wall and the ACL was debrided.  Attention was turned to the lateral with lateral meniscus and lateral femoral condyle were pristine.  The patellofemoral joint was pristine.  At this point, the old ACL stump was debrided.  A notchplasty was performed, and following this, the leg was exsanguinated.  Blood pressure tourniquet was inflated to 300 mmHg and a midline incision was made in the subcutaneous tissue down to the level of the peritenon.  The peritenon was opened and the patellar tendon was then identified, it was only 32 mm.  At that point, we felt that 11 mm would be appropriate size.  So, 11-mm graft was chosen and the central one-third of the tendon with 11 mm was taken, and 10-mm bone plug off the tibia, 9 off the patella were raised and once this was done, this was taken to the back table and fashioned and placed by Marshia Ly, graftologist. Once this was done, attention was turned towards the medial portal where the ACL guide was placed and a bullet guide was used on the medial proximal femur and a  guidewire was advanced into the position of the tibial guide.  It was overreamed with a 10-mm reamer and over-the-top guide was then used in the anatomic position of the ACL and a guidewire was advanced out the distal lateral femur.  The guidewire was then over- reamed with a 9-mm reamer and a notch was made.  Again, we initially made a footprint to make sure we had a back wall.  At this point, the knee was copiously and thoroughly lavaged, and then the graft was advanced into place, locked inside on the femoral side with a 7 x 25 screw with a sheath to protect the graft.  The knee was then cycled 20 times, and once this was completed, the tibial side was locked in with a 7 x 20 screw with no sheath.  Once this was done, the knee was now locked with  pivot negative and the final check was made for loosening of fragment.  Pieces of bone or cartilage none were seen.  The knee was again irrigated, suctioned dry, and once this was done, the wires and sutures were removed from the knee and the defect of the patellar tendon was closed with three interrupted Ethibond sutures.  The peritenon was closed with #1 Vicryl running, the skin was closed with #0 and #2-0 Vicryl and #3-0 Monocryl subcuticular.  Benzoin and Steri-Strips were applied.  Sterile compressive dressing was applied.  The patient was taken to the recovery room and she was noted to be in satisfactory condition.  Estimated tourniquet time was approximately an hour.  The final tourniquet time could be gotten from anesthetic record.  Estimated blood loss for the procedure was none.     Harvie JuniorJohn L. Lillyann Ahart, M.D.     Ranae PlumberJLG/MEDQ  D:  06/05/2014  T:  06/06/2014  Job:  [id number]

## 2014-11-04 ENCOUNTER — Encounter: Payer: Self-pay | Admitting: Diagnostic Neuroimaging

## 2014-11-04 ENCOUNTER — Ambulatory Visit (INDEPENDENT_AMBULATORY_CARE_PROVIDER_SITE_OTHER): Payer: Commercial Managed Care - PPO | Admitting: Diagnostic Neuroimaging

## 2014-11-04 VITALS — BP 111/66 | HR 54 | Temp 97.3°F | Ht 67.0 in | Wt 223.5 lb

## 2014-11-04 DIAGNOSIS — G43109 Migraine with aura, not intractable, without status migrainosus: Secondary | ICD-10-CM

## 2014-11-04 DIAGNOSIS — G44209 Tension-type headache, unspecified, not intractable: Secondary | ICD-10-CM

## 2014-11-04 MED ORDER — TOPIRAMATE 50 MG PO TABS: 50.0000 mg | ORAL_TABLET | Freq: Two times a day (BID) | ORAL | Status: DC

## 2014-11-04 NOTE — Progress Notes (Signed)
[facility]  PATIENT: Whitney Aguirre DOB: 27-Oct-1991  REFERRING CLINICIAN: Bernstorf HISTORY FROM: patient  REASON FOR VISIT: new consult    HISTORICAL  CHIEF COMPLAINT:  Chief Complaint  Patient presents with  . Neurologic Problem    HISTORY OF PRESENT ILLNESS:   23 year old right-handed female here for evaluation of possible pseudotumor cerebri. For past 10 years patient has had monthly migraines, one per month, seeing zigzag lines, spots, throbbing headache with photophobia and phonophobia.  09/14/2014 patient had change in headache type with severe frontal pressure sensation, eye pain, dizziness, blurring, ringing in ears, photophobia. She denies any transient visual obscuration. Patient having daily headaches, more than once per day. Headaches can last hours at a time.  No specific triggering factors. Patient's weight has been ranging between 215 and 230 pounds over past 5 years. Patient currently 223 pounds. Patient has been diagnosed with polycystic ovarian disease and is on birth control tablets.  Patient was evaluated by eye doctor Dr. Abelina BachelorFlaherty, then Dr. Hanley SeamenBernstorf, who was concerned for "small crowded nerves with possibly a small area of disc edema" and a change in the type of headaches.   REVIEW OF SYSTEMS: Full 14 system review of systems performed and notable only for insomnia headache difficulty swallowing dizziness feeling hot feeling cold skin sensitivity ringing in ears spinning sensation chest pain loss of vision eye pain.  ALLERGIES: No Known Allergies  HOME MEDICATIONS: Outpatient Prescriptions Prior to Visit  Medication Sig Dispense Refill  . ibuprofen (ADVIL,MOTRIN) 200 MG tablet Take 400-600 mg by mouth every 6 (six) hours as needed (for pain.).    Marland Kitchen. oxyCODONE-acetaminophen (PERCOCET/ROXICET) 5-325 MG per tablet Take 1-2 tablets by mouth every 6 (six) hours as needed for severe pain. 30 tablet 0   No facility-administered  medications prior to visit.    PAST MEDICAL HISTORY: Past Medical History  Diagnosis Date  . Headache(784.0)     stress  . ACL tear 05/12/2014    injured on trampoline  . Anemia     no current med.  . Polycystic ovarian syndrome     no current med.    PAST SURGICAL HISTORY: Past Surgical History  Procedure Laterality Date  . Wisdom tooth extraction    . Knee arthroscopy with anterior cruciate ligament (acl) repair Left 06/05/2014    Procedure: LEFT KNEE ARTHROSCOPY WITH ANTERIOR CRUCIATE LIGAMENT (ACL) REPAIR;  Surgeon: Harvie JuniorJohn L Graves, MD;  Location: [facility];  Service: Orthopedics;  Laterality: Left;    FAMILY HISTORY: Family History  Problem Relation Age of Onset  . Cardiomyopathy Mother   . High blood pressure Mother   . Emphysema Mother   . Ulcers Father   . Cancer Paternal Grandfather     SOCIAL HISTORY:  History   Social History  . Marital Status: Single    Spouse Name: N/A    Number of Children: 0  . Years of Education: College/RN   Occupational History  .  [place]    ED-RN   Social History Main Topics  . Smoking status: Current Every Day Smoker    Types: Cigarettes  . Smokeless tobacco: Never Used  . Alcohol Use: No  . Drug Use: No  . Sexual Activity: Not on file   Other Topics Concern  . Not on file   Social History Narrative   Patient lives at home with family.   Caffeine Use:1-2 cups     PHYSICAL EXAM  Filed Vitals:   11/04/14 [id number]  BP: 111/66  Pulse: 54  Temp: 97.3 F (36.3 C)  TempSrc: Oral  Height: 5\' 7"  (1.702 m)  Weight: 223 lb 8 oz (101.379 kg)    Body mass index is 35 kg/(m^2).   Visual Acuity Screening   Right eye Left eye Both eyes  Without correction: 20/100 20/70   With correction:       No flowsheet data found.  GENERAL EXAM: Patient is in no distress; well developed, nourished and groomed; neck is supple  CARDIOVASCULAR: Regular rate and rhythm, no murmurs, no carotid  bruits  NEUROLOGIC: MENTAL STATUS: awake, alert, oriented to person, place and time, recent and remote memory intact, normal attention and concentration, language fluent, comprehension intact, naming intact, fund of knowledge appropriate CRANIAL NERVE: no papilledema on fundoscopic exam, pupils equal and reactive to light, visual fields full to confrontation, extraocular muscles intact, no nystagmus, facial sensation and strength symmetric, hearing intact, palate elevates symmetrically, uvula midline, shoulder shrug symmetric, tongue midline. MOTOR: normal bulk and tone, full strength in the BUE, BLE SENSORY: normal and symmetric to light touch, pinprick, temperature, vibration COORDINATION: finger-nose-finger, fine finger movements normal REFLEXES: BUE 2, KNEES 2, ANKLES 1 GAIT/STATION: narrow based gait; able to walk on toes, heels and tandem; romberg is negative    DIAGNOSTIC DATA (LABS, IMAGING, TESTING) - I reviewed patient records, labs, notes, testing and imaging myself where available.  Lab Results  Component Value Date   WBC 6.3 12/29/2011   HGB 13.8 06/05/2014   HCT 34.5* 12/29/2011   MCV 70.8* 12/29/2011   PLT 268 12/29/2011      Component Value Date/Time   NA 136 12/29/2011 0021   K 3.9 12/29/2011 0021   CL 103 12/29/2011 0021   CO2 27 12/29/2011 0021   GLUCOSE 103* 12/29/2011 0021   BUN 12 12/29/2011 0021   CREATININE 0.78 12/29/2011 0021   CALCIUM 9.4 12/29/2011 0021   GFRNONAA >90 12/29/2011 0021   GFRAA >90 12/29/2011 0021   No results found for: CHOL, HDL, LDLCALC, LDLDIRECT, TRIG, CHOLHDL No results found for: ONGE9BHGBA1C No results found for: VITAMINB12 No results found for: TSH    ASSESSMENT AND PLAN  23 y.o. year old female here with history of migraine type headaches for past 10 years, now with change in headaches since October 2015. Also with blurred vision, borderline funduscopic abnormality, ringing in ears, dizziness.   Dx: I agree findings are  suspicious for idiopathic increased intracranial pressure. Will need to rule out other etiologies with MRI and LP.   PLAN:  - I will check MRI of the brain and if structural lesions and hydrocephalus are ruled out, will proceed with lumbar puncture. I have started patient on topiramate empirically until further testing is complete. Patient's questions and concerns were addressed. Side effects of topiramate reviewed.  Orders Placed This Encounter  Procedures  . MR Brain W Wo Contrast   Meds ordered this encounter  Medications  . topiramate (TOPAMAX) 50 MG tablet    Sig: Take 1 tablet (50 mg total) by mouth 2 (two) times daily.    Dispense:  60 tablet    Refill:  12    Start 1 tab at bedtime x 1 week, then take twice a day.   Return in about 1 month (around 12/05/2014).    Suanne MarkerVIKRAM R. Valree Feild, MD 11/04/2014, 10:37 AM Certified in Neurology, Neurophysiology and Neuroimaging  [facility] [address], [address] [place], [place] [address] [phone]

## 2014-11-11 ENCOUNTER — Ambulatory Visit (INDEPENDENT_AMBULATORY_CARE_PROVIDER_SITE_OTHER): Payer: BC Managed Care – PPO

## 2014-11-11 DIAGNOSIS — G43109 Migraine with aura, not intractable, without status migrainosus: Secondary | ICD-10-CM

## 2014-11-11 DIAGNOSIS — G44209 Tension-type headache, unspecified, not intractable: Secondary | ICD-10-CM

## 2014-11-12 MED ORDER — GADOPENTETATE DIMEGLUMINE 469.01 MG/ML IV SOLN: 20.0000 mL | Freq: Once | INTRAVENOUS | Status: AC | PRN

## 2014-11-23 ENCOUNTER — Telehealth: Payer: Self-pay | Admitting: Diagnostic Neuroimaging

## 2014-11-23 DIAGNOSIS — H4710 Unspecified papilledema: Secondary | ICD-10-CM

## 2014-11-23 NOTE — Telephone Encounter (Signed)
Returned patient's call on number provided x2. No answer.

## 2014-11-23 NOTE — Telephone Encounter (Signed)
Spoke to patient. Gave results. Patient would like to proceed with LP. She states her HA's are more frequent and have worsen also.

## 2014-11-23 NOTE — Telephone Encounter (Signed)
Patient requesting MRI results.  Please call and advise. °

## 2014-11-23 NOTE — Telephone Encounter (Signed)
Called patient to give normal MRI results. No answer.

## 2014-11-23 NOTE — Telephone Encounter (Signed)
Patient is returning a call regarding MRI results. Do not leave voice message per patient.

## 2014-11-30 NOTE — Telephone Encounter (Signed)
Spoke to patient and informed.

## 2014-11-30 NOTE — Telephone Encounter (Signed)
LP ordered. Let patient know. -VRP

## 2014-12-03 ENCOUNTER — Ambulatory Visit
Admission: RE | Admit: 2014-12-03 | Discharge: 2014-12-03 | Disposition: A | Discharge status: Home / Self Care | Payer: 59 | Source: Ambulatory Visit | Attending: Diagnostic Neuroimaging | Admitting: Diagnostic Neuroimaging

## 2014-12-03 ENCOUNTER — Other Ambulatory Visit: Payer: Self-pay | Admitting: Diagnostic Neuroimaging

## 2014-12-03 DIAGNOSIS — H4710 Unspecified papilledema: Secondary | ICD-10-CM

## 2014-12-03 LAB — CSF CELL COUNT WITH DIFFERENTIAL
RBC Count, CSF: 0 cu mm
Tube #: 3
WBC, CSF: 0 cu mm (ref 0–5)

## 2014-12-03 LAB — GRAM STAIN: Gram Stain: NONE SEEN

## 2014-12-03 LAB — PROTEIN, CSF: Total Protein, CSF: 28 mg/dL (ref 15–45)

## 2014-12-03 LAB — GLUCOSE, CSF: Glucose, CSF: 52 mg/dL (ref 43–76)

## 2014-12-03 MED ORDER — DIAZEPAM 5 MG PO TABS
5.0000 mg | ORAL_TABLET | Freq: Once | ORAL | Status: AC
  Administered 2014-12-03: 5 mg via ORAL

## 2014-12-03 NOTE — Discharge Instructions (Signed)

## 2014-12-07 ENCOUNTER — Encounter (HOSPITAL_COMMUNITY): Payer: Self-pay | Admitting: Emergency Medicine

## 2014-12-07 ENCOUNTER — Emergency Department (HOSPITAL_COMMUNITY)
Admission: EM | Admit: 2014-12-07 | Discharge: 2014-12-07 | Disposition: A | Discharge status: Home / Self Care | Payer: 59 | Attending: Emergency Medicine | Admitting: Emergency Medicine

## 2014-12-07 ENCOUNTER — Telehealth: Payer: Self-pay | Admitting: Diagnostic Neuroimaging

## 2014-12-07 DIAGNOSIS — G971 Other reaction to spinal and lumbar puncture: Secondary | ICD-10-CM | POA: Diagnosis not present

## 2014-12-07 DIAGNOSIS — R112 Nausea with vomiting, unspecified: Secondary | ICD-10-CM | POA: Diagnosis present

## 2014-12-07 DIAGNOSIS — Z8639 Personal history of other endocrine, nutritional and metabolic disease: Secondary | ICD-10-CM | POA: Diagnosis not present

## 2014-12-07 DIAGNOSIS — Z3202 Encounter for pregnancy test, result negative: Secondary | ICD-10-CM | POA: Diagnosis not present

## 2014-12-07 DIAGNOSIS — Z862 Personal history of diseases of the blood and blood-forming organs and certain disorders involving the immune mechanism: Secondary | ICD-10-CM | POA: Insufficient documentation

## 2014-12-07 DIAGNOSIS — Z720 Tobacco use: Secondary | ICD-10-CM | POA: Diagnosis not present

## 2014-12-07 LAB — CBC WITH DIFFERENTIAL/PLATELET
Basophils Absolute: 0 10*3/uL (ref 0.0–0.1)
Basophils Relative: 0 % (ref 0–1)
Eosinophils Absolute: 0.1 10*3/uL (ref 0.0–0.7)
Eosinophils Relative: 1 % (ref 0–5)
HCT: 42.3 % (ref 36.0–46.0)
Hemoglobin: 14.6 g/dL (ref 12.0–15.0)
Lymphocytes Relative: 15 % (ref 12–46)
Lymphs Abs: 1.3 10*3/uL (ref 0.7–4.0)
MCH: 28.8 pg (ref 26.0–34.0)
MCHC: 34.5 g/dL (ref 30.0–36.0)
MCV: 83.4 fL (ref 78.0–100.0)
Monocytes Absolute: 0.6 10*3/uL (ref 0.1–1.0)
Monocytes Relative: 7 % (ref 3–12)
Neutro Abs: 6.9 10*3/uL (ref 1.7–7.7)
Neutrophils Relative %: 77 % (ref 43–77)
Platelets: 228 10*3/uL (ref 150–400)
RBC: 5.07 MIL/uL (ref 3.87–5.11)
RDW: 13.1 % (ref 11.5–15.5)
WBC: 8.9 10*3/uL (ref 4.0–10.5)

## 2014-12-07 LAB — BASIC METABOLIC PANEL
Anion gap: 9 (ref 5–15)
BUN: 11 mg/dL (ref 6–23)
CO2: 23 mmol/L (ref 19–32)
Calcium: 9.1 mg/dL (ref 8.4–10.5)
Chloride: 106 mEq/L (ref 96–112)
Creatinine, Ser: 0.9 mg/dL (ref 0.50–1.10)
GFR calc Af Amer: >90 mL/min (ref >90)
GFR calc non Af Amer: >90 mL/min (ref >90)
Glucose, Bld: 106 mg/dL — ABNORMAL HIGH (ref 70–99)
Potassium: 4.1 mmol/L (ref 3.5–5.1)
Sodium: 138 mmol/L (ref 135–145)

## 2014-12-07 LAB — POC URINE PREG, ED: Preg Test, Ur: NEGATIVE

## 2014-12-07 MED ORDER — CAFFEINE-SODIUM BENZOATE 125-125 MG/ML IJ SOLN
500.0000 mg | Freq: Once | INTRAMUSCULAR | Status: AC
  Administered 2014-12-07: 500 mg via INTRAVENOUS
  Filled 2014-12-07: qty 2

## 2014-12-07 MED ORDER — ONDANSETRON HCL 8 MG PO TABS: 8.0000 mg | ORAL_TABLET | ORAL | Status: DC | PRN

## 2014-12-07 MED ORDER — METOCLOPRAMIDE HCL 5 MG/ML IJ SOLN
10.0000 mg | Freq: Once | INTRAMUSCULAR | Status: AC
  Administered 2014-12-07: 10 mg via INTRAVENOUS
  Filled 2014-12-07: qty 2

## 2014-12-07 MED ORDER — SODIUM CHLORIDE 0.9 % IV BOLUS (SEPSIS)
1000.0000 mL | Freq: Once | INTRAVENOUS | Status: AC
  Administered 2014-12-07: 1000 mL via INTRAVENOUS

## 2014-12-07 MED ORDER — DIPHENHYDRAMINE HCL 50 MG/ML IJ SOLN
25.0000 mg | Freq: Once | INTRAMUSCULAR | Status: AC
  Administered 2014-12-07: 25 mg via INTRAVENOUS
  Filled 2014-12-07: qty 1

## 2014-12-07 NOTE — Telephone Encounter (Signed)
Tried to call patient no answer. 

## 2014-12-07 NOTE — Telephone Encounter (Signed)
Patient calling for Lumbar Puncture results.  Please call and advise. °

## 2014-12-07 NOTE — ED Notes (Signed)
Pt from work for eval of emesis x2 and HA since Saturday. Pt reports Lumbar puncture for intermittent HA on Thursday and has had a HA since. Denies any diarrhea, syncope, or fevers at this time. NAD. Axox4.

## 2014-12-07 NOTE — ED Notes (Signed)
MD at bedside. 

## 2014-12-07 NOTE — ED Provider Notes (Signed)
CSN: [id number]     Arrival date & time 12/07/14  1656 History   First MD Initiated Contact with Patient 12/07/14 1704     Chief Complaint  Patient presents with  . Headache  . Emesis     (Consider location/radiation/quality/duration/timing/severity/associated sxs/prior Treatment) HPI  24 year old female presents with nausea and vomiting since today. She's been having a headache after getting a lumbar puncture 4 days ago. She received this lumbar puncture due to undifferentiated daily headaches since October. She's not had any fevers or chills. The headache is better when laying flat, worse when sitting up. Has been trying ibuprofen and Tylenol without relief. Has also taken Excedrin Migraine. Patient felt lightheaded today. Has not had any diarrhea. Patient has not had any neck stiffness, photophobia, blurry vision, chest pain, shortness of breath, or abdominal pain. No urinary symptoms.  Past Medical History  Diagnosis Date  . Headache(784.0)     stress  . ACL tear 05/12/2014    injured on trampoline  . Anemia     no current med.  . Polycystic ovarian syndrome     no current med.   Past Surgical History  Procedure Laterality Date  . Wisdom tooth extraction    . Knee arthroscopy with anterior cruciate ligament (acl) repair Left 06/05/2014    Procedure: LEFT KNEE ARTHROSCOPY WITH ANTERIOR CRUCIATE LIGAMENT (ACL) REPAIR;  Surgeon: Harvie JuniorJohn L Graves, MD;  [facility];  Service: Orthopedics;  Laterality: Left;   Family History  Problem Relation Age of Onset  . Cardiomyopathy Mother   . High blood pressure Mother   . Emphysema Mother   . Ulcers Father   . Cancer Paternal Grandfather    History  Substance Use Topics  . Smoking status: Current Every Day Smoker    Types: Cigarettes  . Smokeless tobacco: Never Used  . Alcohol Use: No   OB History    No data available     Review of Systems  Constitutional: Negative for fever.  Eyes: Negative for  photophobia and visual disturbance.  Respiratory: Negative for cough and shortness of breath.   Gastrointestinal: Positive for nausea and vomiting. Negative for abdominal pain.  Genitourinary: Negative for dysuria.  Neurological: Positive for headaches. Negative for weakness.  All other systems reviewed and are negative.     Allergies  Review of patient's allergies indicates no known allergies.  Home Medications   Prior to Admission medications   Medication Sig Start Date End Date Taking? Authorizing Provider  levonorgestrel-ethinyl estradiol (AVIANE,ALESSE,LESSINA) 0.1-20 MG-MCG tablet Take 1 tablet by mouth daily. 11/03/14   Historical Provider, MD  LEVOTHYROXINE SODIUM PO Take 1 tablet by mouth daily.    Historical Provider, MD  topiramate (TOPAMAX) 50 MG tablet Take 1 tablet (50 mg total) by mouth 2 (two) times daily. 11/04/14   Vikram R Penumalli, MD   BP 132/61 mmHg  Pulse 60  Temp(Src) 97.5 F (36.4 C) (Oral)  Resp 16  SpO2 100%  LMP 12/03/2014 Physical Exam  Constitutional: She is oriented to person, place, and time. She appears well-developed and well-nourished.  HENT:  Head: Normocephalic and atraumatic.  Right Ear: External ear normal.  Left Ear: External ear normal.  Nose: Nose normal.  Eyes: Right eye exhibits no discharge. Left eye exhibits no discharge.  Neck: Normal range of motion. Neck supple.  No neck stiffness, full ROM  Cardiovascular: Normal rate, regular rhythm and normal heart sounds.   Pulmonary/Chest: Effort normal and breath sounds normal. She has no  wheezes. She has no rales.  Abdominal: Soft. She exhibits no distension. There is no tenderness.  Musculoskeletal:  Small mark on lumbar spine c/w prior LP. No surrounding swelling/hematoma or erythema or drainage  Neurological: She is alert and oriented to person, place, and time.  CN 2-12 grossly intact. 5/5 strength in all 4 extremities  Skin: Skin is warm and dry. She is not diaphoretic.  Nursing  note and vitals reviewed.   ED Course  Procedures (including critical care time) Labs Review Labs Reviewed  BASIC METABOLIC PANEL - Abnormal; Notable for the following:    Glucose, Bld 106 (*)    All other components within normal limits  CBC WITH DIFFERENTIAL  POC URINE PREG, ED    Imaging Review No results found.   EKG Interpretation None      MDM   Final diagnoses:  Nausea and vomiting in adult  Post lumbar puncture headache    Patient's headache is consistent with a post lumbar puncture headache. Is likely where the nausea and vomiting is coming from. No sudden, severe onset of headache to suggest a subarachnoid hemorrhage or other more severe headache. No neck rigidity, normal neurologic exam. She feels better with fluids, Reglan, and IV caffeine. Headache is gone at this time. No vomiting while in the ER. I believe she is stable for discharge, will treat with Zofran at home for nausea and vomiting as needed, and will follow-up with neurologist if headache continues.    Audree Camel, MD 12/07/14 [phone]

## 2014-12-09 NOTE — Telephone Encounter (Signed)
I called patient. LP results reviewed --> normal. Patient stopped birth control, and headaches are improving.  Continue topiramate and follow up in 1 month.  -VRP

## 2014-12-10 NOTE — Telephone Encounter (Signed)
Called patient to schedule 1 month f/u. No answer.

## 2014-12-21 NOTE — Telephone Encounter (Signed)
Spoke to patient. Scheduled 1 month f/u per patient's work schedule.

## 2015-01-13 ENCOUNTER — Ambulatory Visit: Payer: Self-pay | Admitting: Diagnostic Neuroimaging

## 2015-01-14 ENCOUNTER — Encounter: Payer: Self-pay | Admitting: Diagnostic Neuroimaging

## 2015-08-24 ENCOUNTER — Telehealth: Payer: 59 | Admitting: Nurse Practitioner

## 2015-08-24 DIAGNOSIS — R05 Cough: Secondary | ICD-10-CM | POA: Diagnosis not present

## 2015-08-24 DIAGNOSIS — R059 Cough, unspecified: Secondary | ICD-10-CM

## 2015-08-24 MED ORDER — AZITHROMYCIN 250 MG PO TABS: ORAL_TABLET | ORAL | Status: DC

## 2015-08-24 NOTE — Progress Notes (Signed)

## 2015-12-01 DIAGNOSIS — E063 Autoimmune thyroiditis: Secondary | ICD-10-CM | POA: Diagnosis not present

## 2015-12-01 DIAGNOSIS — Z6836 Body mass index (BMI) 36.0-36.9, adult: Secondary | ICD-10-CM | POA: Diagnosis not present

## 2016-01-25 ENCOUNTER — Telehealth: Payer: 59 | Admitting: Family

## 2016-01-25 DIAGNOSIS — J069 Acute upper respiratory infection, unspecified: Secondary | ICD-10-CM | POA: Diagnosis not present

## 2016-01-25 MED ORDER — BENZONATATE 100 MG PO CAPS: 100.0000 mg | ORAL_CAPSULE | Freq: Three times a day (TID) | ORAL | Status: DC | PRN

## 2016-01-25 NOTE — Progress Notes (Signed)
We are sorry that you are not feeling well.  Here is how we plan to help!  Based on what you have shared with me it looks like you have upper respiratory tract inflammation that has resulted in a significant cough.  Inflammation and infection in the upper respiratory tract is commonly called bronchitis and has four common causes:  Allergies, Viral Infections, Acid Reflux and Bacterial Infections.  Allergies, viruses and acid reflux are treated by controlling symptoms or eliminating the cause. An example might be a cough caused by taking certain blood pressure medications. You stop the cough by changing the medication. Another example might be a cough caused by acid reflux. Controlling the reflux helps control the cough.  Based on your presentation I believe you most likely have A cough due to a virus.  This is called viral bronchitis and is best treated by rest, plenty of fluids and control of the cough.  You may use Ibuprofen or Tylenol as directed to help your symptoms.    In addition you may use A non-prescription cough medication called Mucinex DM: take 2 tablets every 12 hours. and A prescription cough medication called Tessalon Perles . You may take 1-2 capsules every 8 hours as needed for your cough.    Based on your symptoms, it is unlikely that you have the flu virus.   HOME CARE . Only take medications as instructed by your medical team. . Complete the entire course of an antibiotic. . Drink plenty of fluids and get plenty of rest. . Avoid close contacts especially the very young and the elderly . Cover your mouth if you cough or cough into your sleeve. . Always remember to wash your hands . A steam or ultrasonic humidifier can help congestion.    GET HELP RIGHT AWAY IF: . You develop worsening fever. . You become short of breath . You cough up blood. . Your symptoms persist after you have completed your treatment plan MAKE SURE YOU   Understand these instructions.  Will  watch your condition.  Will get help right away if you are not doing well or get worse.  Your e-visit answers were reviewed by a board certified advanced clinical practitioner to complete your personal care plan.  Depending on the condition, your plan could have included both over the counter or prescription medications. If there is a problem please reply  once you have received a response from your provider. Your safety is important to [place].  If you have drug allergies check your prescription carefully.    You can use MyChart to ask questions about today's visit, request a non-urgent call back, or ask for a work or school excuse for 24 hours related to this e-Visit. If it has been greater than 24 hours you will need to follow up with your provider, or enter a new e-Visit to address those concerns. You will get an e-mail in the next two days asking about your experience.  I hope that your e-visit has been valuable and will speed your recovery. Thank you for using e-visits.

## 2016-01-29 DIAGNOSIS — M25562 Pain in left knee: Secondary | ICD-10-CM | POA: Diagnosis not present

## 2016-03-13 DIAGNOSIS — R591 Generalized enlarged lymph nodes: Secondary | ICD-10-CM | POA: Diagnosis not present

## 2016-03-13 DIAGNOSIS — J019 Acute sinusitis, unspecified: Secondary | ICD-10-CM | POA: Diagnosis not present

## 2016-03-13 DIAGNOSIS — Z6837 Body mass index (BMI) 37.0-37.9, adult: Secondary | ICD-10-CM | POA: Diagnosis not present

## 2016-04-03 DIAGNOSIS — E669 Obesity, unspecified: Secondary | ICD-10-CM | POA: Diagnosis not present

## 2016-04-03 DIAGNOSIS — N912 Amenorrhea, unspecified: Secondary | ICD-10-CM | POA: Diagnosis not present

## 2016-04-03 DIAGNOSIS — E282 Polycystic ovarian syndrome: Secondary | ICD-10-CM | POA: Diagnosis not present

## 2016-04-03 DIAGNOSIS — E039 Hypothyroidism, unspecified: Secondary | ICD-10-CM | POA: Diagnosis not present

## 2016-04-10 DIAGNOSIS — M25562 Pain in left knee: Secondary | ICD-10-CM | POA: Diagnosis not present

## 2016-04-21 DIAGNOSIS — M25562 Pain in left knee: Secondary | ICD-10-CM | POA: Diagnosis not present

## 2016-04-25 DIAGNOSIS — M25562 Pain in left knee: Secondary | ICD-10-CM | POA: Diagnosis not present

## 2016-05-09 DIAGNOSIS — M25562 Pain in left knee: Secondary | ICD-10-CM | POA: Diagnosis not present

## 2016-05-23 DIAGNOSIS — E669 Obesity, unspecified: Secondary | ICD-10-CM | POA: Diagnosis not present

## 2016-05-23 DIAGNOSIS — E282 Polycystic ovarian syndrome: Secondary | ICD-10-CM | POA: Diagnosis not present

## 2016-05-23 DIAGNOSIS — E039 Hypothyroidism, unspecified: Secondary | ICD-10-CM | POA: Diagnosis not present

## 2016-05-23 DIAGNOSIS — N912 Amenorrhea, unspecified: Secondary | ICD-10-CM | POA: Diagnosis not present

## 2016-07-05 DIAGNOSIS — E039 Hypothyroidism, unspecified: Secondary | ICD-10-CM | POA: Diagnosis not present

## 2016-08-09 ENCOUNTER — Telehealth: Payer: 59 | Admitting: Family

## 2016-08-09 DIAGNOSIS — R6889 Other general symptoms and signs: Secondary | ICD-10-CM

## 2016-08-09 NOTE — Progress Notes (Signed)

## 2016-08-20 ENCOUNTER — Telehealth: Payer: 59 | Admitting: Family

## 2016-08-20 DIAGNOSIS — J069 Acute upper respiratory infection, unspecified: Secondary | ICD-10-CM

## 2016-08-20 DIAGNOSIS — B9689 Other specified bacterial agents as the cause of diseases classified elsewhere: Secondary | ICD-10-CM

## 2016-08-20 MED ORDER — PREDNISONE 5 MG PO TABS: 5.0000 mg | ORAL_TABLET | ORAL | 0 refills | Status: DC

## 2016-08-20 MED ORDER — BENZONATATE 100 MG PO CAPS: 100.0000 mg | ORAL_CAPSULE | Freq: Three times a day (TID) | ORAL | 0 refills | Status: DC | PRN

## 2016-08-20 MED ORDER — ALBUTEROL SULFATE HFA 108 (90 BASE) MCG/ACT IN AERS: 1.0000 | INHALATION_SPRAY | RESPIRATORY_TRACT | 2 refills | Status: DC | PRN

## 2016-08-20 MED ORDER — AZITHROMYCIN 250 MG PO TABS: ORAL_TABLET | ORAL | 0 refills | Status: DC

## 2016-08-20 NOTE — Progress Notes (Signed)

## 2016-08-29 ENCOUNTER — Encounter: Payer: Self-pay | Admitting: Family Medicine

## 2016-08-29 ENCOUNTER — Ambulatory Visit (INDEPENDENT_AMBULATORY_CARE_PROVIDER_SITE_OTHER): Payer: 59 | Admitting: Family Medicine

## 2016-08-29 ENCOUNTER — Ambulatory Visit (INDEPENDENT_AMBULATORY_CARE_PROVIDER_SITE_OTHER)
Admission: RE | Admit: 2016-08-29 | Discharge: 2016-08-29 | Disposition: A | Discharge status: Home / Self Care | Payer: 59 | Source: Ambulatory Visit | Attending: Family Medicine | Admitting: Family Medicine

## 2016-08-29 VITALS — BP 120/70 | HR 97 | Temp 98.3°F | Resp 12 | Ht 67.0 in | Wt 246.1 lb

## 2016-08-29 DIAGNOSIS — J309 Allergic rhinitis, unspecified: Secondary | ICD-10-CM | POA: Insufficient documentation

## 2016-08-29 DIAGNOSIS — J069 Acute upper respiratory infection, unspecified: Secondary | ICD-10-CM

## 2016-08-29 DIAGNOSIS — B9789 Other viral agents as the cause of diseases classified elsewhere: Secondary | ICD-10-CM | POA: Diagnosis not present

## 2016-08-29 DIAGNOSIS — R079 Chest pain, unspecified: Secondary | ICD-10-CM | POA: Diagnosis not present

## 2016-08-29 DIAGNOSIS — R059 Cough, unspecified: Secondary | ICD-10-CM

## 2016-08-29 DIAGNOSIS — R05 Cough: Secondary | ICD-10-CM

## 2016-08-29 MED ORDER — FLUTICASONE PROPIONATE 50 MCG/ACT NA SUSP: 1.0000 | Freq: Two times a day (BID) | NASAL | 3 refills | Status: DC

## 2016-08-29 NOTE — Patient Instructions (Addendum)
A few things to remember from today's visit:   Viral upper respiratory tract infection  Cough - Plan: DG Chest 2 View  Chronic allergic rhinitis, unspecified seasonality, unspecified trigger - Plan: fluticasone (FLONASE) 50 MCG/ACT nasal spray  Most of respiratory infections are viral.  Viral infections are self-limited and we treat each symptom depending of severity.   Over the counter medications as decongestants and cold medications usually help, they need to be taken with caution if there is a history of high blood pressure or palpitations. Tylenol and/or Ibuprofen also helps with most symptoms (headache, muscle aching, fever,etc) Plenty of fluids. Honey helps with cough. Steam inhalations helps with runny nose, nasal congestion, and may prevent sinus infections. Cough and nasal congestion could last a few days and sometimes weeks. Please follow in not any better in 1-2 weeks or if symptoms get worse.   Over-the-counter Zyrtec 10 mg or Allegra 180 mg daily, nasal saline rinse nose at night, and Flonase nasal spray 1 twice a day.  Please be sure medication list is accurate.

## 2016-08-29 NOTE — Progress Notes (Signed)
HPI:   [name] is a 25 y.o. female, who is here today to establish care with me.  Former PCP: N/A Last preventive routine visit: she follows with gyn regularly, pap smear at age 25. Hx of PCOS and hypothyroidism , follows with endocrinologists. She is currently on Metformin, OCP's, and Levothyroxine.  Next appt with endocrinologists in 2 weeks.   Concerns today: persistent respiratory symptoms, about 2 weeks, a couple days after she received flu vaccine.  08/09/16 and 08/20/16 E-Visit: Cough,wheezing, and SOB.  Reporting Hx of pneumonia in R lung twice in the past year, last one this year. She denies Hx of asthma. + Smoker.  + Productive cough, no coughing up sputum.  Denies fever, chill, or myalgias. + Nasal congestion, rhinorrhea, sneezing ("a lot"), nose itching, epiphora, and post nasal drainage. Sore throat resolved. SOB and wheezing improved.  She has not identified exacerbating or alleviating factors.  No Hx of recent travel. 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 No known insect bite. + Hx of allergies.  OTC medications for this problem: none. She completed Azithromycin, Prednisone, Albuterol inh, and Benzonatate treatment.  Symptoms otherwise improving.  No Hx of GERD.   Review of Systems  Constitutional: Negative for activity change, appetite change, fatigue, fever and unexpected weight change.  HENT: Positive for congestion, postnasal drip and sneezing. Negative for ear pain, facial swelling, mouth sores, nosebleeds, sinus pressure, sore throat, trouble swallowing and voice change.   Eyes: Positive for discharge. Negative for pain, redness and itching.  Respiratory: Positive for cough and shortness of breath. Negative for wheezing and stridor.   Cardiovascular: Negative for chest pain and leg swelling.  Gastrointestinal: Negative for abdominal pain, diarrhea, nausea and vomiting.  Musculoskeletal: Negative for myalgias  and neck pain.  Skin: Negative for color change and rash.  Allergic/Immunologic: Positive for environmental allergies.  Neurological: Negative for syncope, weakness, numbness and headaches.  Hematological: Negative for adenopathy. Does not bruise/bleed easily.      Current Outpatient Prescriptions on File Prior to Visit  Medication Sig Dispense Refill  . albuterol (PROVENTIL HFA;VENTOLIN HFA) 108 (90 Base) MCG/ACT inhaler Inhale 1-2 puffs into the lungs every 4 (four) hours as needed for wheezing or shortness of breath. 1 Inhaler 2  . LEVOTHYROXINE SODIUM PO Take 1 tablet by mouth daily.    . predniSONE (DELTASONE) 5 MG tablet Take 1 tablet (5 mg total) by mouth as directed. 21 tab dose pack taper per pharmacy 21 tablet 0   No current facility-administered medications on file prior to visit.      Past Medical History:  Diagnosis Date  . ACL tear 05/12/2014   injured on trampoline  . Anemia    no current med.  Marland Kitchen. Headache(784.0)    stress  . Polycystic ovarian syndrome    no current med.   No Known Allergies  Family History  Problem Relation Age of Onset  . Cardiomyopathy Mother   . High blood pressure Mother   . Emphysema Mother   . Ulcers Father   . Cancer Paternal Grandfather     Social History   Social History  . Marital status: Single    Spouse name: N/A  . Number of children: 0  . Years of education: College/RN   Occupational History  .  [employer]    ED-RN   Social History Main Topics  . Smoking status: Current Every Day Smoker    Types: Cigarettes  .  Smokeless tobacco: Never Used  . Alcohol use No  . Drug use: No  . Sexual activity: Not Asked   Other Topics Concern  . None   Social History Narrative   Patient lives at home with family.   Caffeine Use:1-2 cups    Vitals:   08/29/16 1252  BP: 120/70  Pulse: 97  Resp: 12  Temp: 98.3 F (36.8 C)   O2 at RA 97%  Body mass index is 38.55 kg/m.    Physical Exam  Nursing note and  vitals reviewed. Constitutional: She is oriented to person, place, and time. She appears well-developed. She does not appear ill. No distress.  HENT:  Head: Atraumatic.  Right Ear: Tympanic membrane, external ear and ear canal normal.  Left Ear: Tympanic membrane, external ear and ear canal normal.  Nose: Rhinorrhea present. Right sinus exhibits no maxillary sinus tenderness and no frontal sinus tenderness. Left sinus exhibits no maxillary sinus tenderness and no frontal sinus tenderness.  Mouth/Throat: Oropharynx is clear and moist and mucous membranes are normal.  Post nasal drainage.  Eyes: Conjunctivae and EOM are normal.  Cardiovascular: Normal rate and regular rhythm.   No murmur heard. Respiratory: Effort normal and breath sounds normal. No stridor. No respiratory distress.  GI: Soft. She exhibits no mass. There is no hepatomegaly. There is no tenderness.  Musculoskeletal: She exhibits no edema or tenderness.  Lymphadenopathy:       Head (right side): No submandibular adenopathy present.       Head (left side): No submandibular adenopathy present.    She has no cervical adenopathy.  Neurological: She is alert and oriented to person, place, and time. She has normal strength.  Skin: Skin is warm. No rash noted. No erythema.  Psychiatric: She has a normal mood and affect. Her speech is normal.  Well groomed, good eye contact.      ASSESSMENT AND PLAN:   Whitney Aguirre was seen today for new patient (initial visit).  Diagnoses and all orders for this visit:    Viral upper respiratory tract infection  Symptoms and Hx provided today suggests she may have a viral URI and now she seems to be having residual symptom. Instructed to monitor for signs of complications, including new onset of fever among some, clearly instructed about warning signs. I also explained that cough and nasal congestion can last a few days and sometimes weeks. F/U as needed.   Cough  She is concerned about  pneumonia, auscultation today does not suggest bacterial infection. CXR will be arranged and for the recommendations would be given accordingly. Plain Mucinex may help.  -     DG Chest 2 View; Future    Chronic allergic rhinitis, unspecified seasonality, unspecified trigger  She seems to have some symptoms related with allergies or rhinitis, which could aggravate nasal congestion and cough. Recommend OTC antihistaminic: Zyrtec 10 mg or Allegra 180 mg. Flonase intranasal spray and nasal saline. We could consider Singulair if symptoms do not improved. F/U as needed.   -     fluticasone (FLONASE) 50 MCG/ACT nasal spray; Place 1 spray into both nostrils 2 (two) times daily.        Masaichi Kracht G. [place], MD  [facility]. [place] office.

## 2016-08-30 ENCOUNTER — Encounter: Payer: Self-pay | Admitting: Family Medicine

## 2016-09-07 ENCOUNTER — Encounter: Payer: Self-pay | Admitting: Family Medicine

## 2016-09-07 ENCOUNTER — Ambulatory Visit (INDEPENDENT_AMBULATORY_CARE_PROVIDER_SITE_OTHER): Payer: 59 | Admitting: Family Medicine

## 2016-09-07 VITALS — BP 122/70 | HR 74 | Resp 12 | Ht 67.0 in | Wt 249.5 lb

## 2016-09-07 DIAGNOSIS — G4700 Insomnia, unspecified: Secondary | ICD-10-CM | POA: Diagnosis not present

## 2016-09-07 MED ORDER — TRAZODONE HCL 50 MG PO TABS: 25.0000 mg | ORAL_TABLET | Freq: Every evening | ORAL | 1 refills | Status: DC | PRN

## 2016-09-07 NOTE — Patient Instructions (Addendum)
A few things to remember from today's visit:   Insomnia, unspecified type - Plan: traZODone (DESYREL) 50 MG tablet  Good sleep hygiene. Titrate dose of medication to 100 mg, take medication 45-30 minutes before bedtime.  Please be sure medication list is accurate. If a new problem present, please set up appointment sooner than planned today.

## 2016-09-07 NOTE — Progress Notes (Signed)
HPI:  ACUTE VISIT:  Chief Complaint  Patient presents with  . trouble sleeping    going on for a few months, works at least 120 hrs every 2 weeks    [name] is a 25 y.o. female, who is here today complaining of difficulty sleeping.   She is reporting years history of insomnia, she has trouble falling asleep and staying asleep. She feels like problem is getting worse, for the past few days she has not slept more than 4 hours.  She has decreased caffeine intake, drinks 2 cups of coffee in the morning. She has history of "terrible" anxiety and some depression, she denies any history of bipolar disorder. She states that she has been able to manage her symptoms and doesn't feel like she needs medication.  In the past she was on Zoloft, which didn't help with insomnia. She is currently taking OTC melatonin but it is not helping. She wonders if Clonazepam or Ambien will help.  + Fatigue.  She has history of hypothyroidism, currently she is following with endocrinologist. She tells me she has an appointment to repeat thyroid function early in November.    Review of Systems  Constitutional: Positive for fatigue. Negative for appetite change, diaphoresis and fever.  HENT: Negative for congestion, mouth sores, nosebleeds, trouble swallowing and voice change.   Respiratory: Negative for cough, shortness of breath and wheezing.   Cardiovascular: Negative for chest pain, palpitations and leg swelling.  Gastrointestinal: Negative for abdominal pain, nausea and vomiting.       No changes in bowel habits.  Endocrine: Negative for cold intolerance and heat intolerance.  Neurological: Negative for dizziness, tremors, seizures, syncope, weakness and headaches.  Psychiatric/Behavioral: Positive for sleep disturbance. Negative for hallucinations and suicidal ideas. The patient is nervous/anxious.       Current Outpatient Prescriptions on File Prior to Visit  Medication Sig  Dispense Refill  . albuterol (PROVENTIL HFA;VENTOLIN HFA) 108 (90 Base) MCG/ACT inhaler Inhale 1-2 puffs into the lungs every 4 (four) hours as needed for wheezing or shortness of breath. 1 Inhaler 2  . fluticasone (FLONASE) 50 MCG/ACT nasal spray Place 1 spray into both nostrils 2 (two) times daily. 16 g 3  . LEVOTHYROXINE SODIUM PO Take 1 tablet by mouth daily.    . metFORMIN (GLUCOPHAGE) 500 MG tablet Take 500 mg by mouth daily with breakfast.     No current facility-administered medications on file prior to visit.      Past Medical History:  Diagnosis Date  . ACL tear 05/12/2014   injured on trampoline  . Anemia    no current med.  . Anxiety   . Depression   . Headache(784.0)    stress  . Polycystic ovarian syndrome    no current med.   No Known Allergies  Social History   Social History  . Marital status: Single    Spouse name: N/A  . Number of children: 0  . Years of education: College/RN   Occupational History  .  [employer]    ED-RN   Social History Main Topics  . Smoking status: Current Every Day Smoker    Types: Cigarettes  . Smokeless tobacco: Never Used  . Alcohol use No  . Drug use: No  . Sexual activity: Not Asked   Other Topics Concern  . None   Social History Narrative   Patient lives at home with family.   Caffeine Use:1-2 cups    Vitals:  09/07/16 1437  BP: 122/70  Pulse: 74  Resp: 12   O2 at RA 97%  Body mass index is 39.08 kg/m.    Physical Exam  Nursing note and vitals reviewed. Constitutional: She is oriented to person, place, and time. She appears well-developed. No distress.  HENT:  Mouth/Throat: Oropharynx is clear and moist and mucous membranes are normal.  Eyes: Conjunctivae and EOM are normal. Pupils are equal, round, and reactive to light.  Cardiovascular: Normal rate and regular rhythm.   No murmur heard. Respiratory: Effort normal and breath sounds normal. No respiratory distress.  Neurological: She is alert  and oriented to person, place, and time. She has normal strength. Coordination and gait normal.  Skin: Skin is warm. No erythema.  Psychiatric: Her speech is normal. Her mood appears anxious. Cognition and memory are normal.  Well groomed, good eye contact.      ASSESSMENT AND PLAN:     Arlina was seen today for trouble sleeping.  Diagnoses and all orders for this visit:  Insomnia, unspecified type -     traZODone (DESYREL) 50 MG tablet; Take 0.5-1 tablets (25-50 mg total) by mouth at bedtime as needed for sleep.   We discussed possible etiologies, including psychiatric disorders and caffeine intake. We also discussed a few treatment options, she agrees with trying Trazodone, she can restart 25 mg and titrate up every 3 days by 25 mg up to 100 mg as tolerated. We discussed some side effects. Good sleep hygiene. I will see her back in 6 weeks, before if needed.      Return in about 6 weeks (around 10/19/2016) for insomnia/anxiety.     -[name] was advised to return or notify a doctor immediately if symptoms worsen or new concerns arise.       Lexine Jaspers G. [place], MD  [facility]. [place] office.

## 2016-09-11 DIAGNOSIS — E039 Hypothyroidism, unspecified: Secondary | ICD-10-CM | POA: Diagnosis not present

## 2016-09-29 ENCOUNTER — Encounter: Payer: Self-pay | Admitting: Family Medicine

## 2016-09-30 ENCOUNTER — Encounter: Payer: Self-pay | Admitting: Family Medicine

## 2016-10-04 ENCOUNTER — Ambulatory Visit (INDEPENDENT_AMBULATORY_CARE_PROVIDER_SITE_OTHER): Payer: 59 | Admitting: Family Medicine

## 2016-10-04 ENCOUNTER — Encounter: Payer: Self-pay | Admitting: Family Medicine

## 2016-10-04 VITALS — BP 124/80 | HR 78 | Resp 12 | Ht 67.0 in | Wt 243.1 lb

## 2016-10-04 DIAGNOSIS — E039 Hypothyroidism, unspecified: Secondary | ICD-10-CM | POA: Diagnosis not present

## 2016-10-04 DIAGNOSIS — R197 Diarrhea, unspecified: Secondary | ICD-10-CM

## 2016-10-04 LAB — CBC WITH DIFFERENTIAL/PLATELET
Basophils Absolute: 0 10*3/uL (ref 0.0–0.1)
Basophils Relative: 0.5 % (ref 0.0–3.0)
Eosinophils Absolute: 0.2 10*3/uL (ref 0.0–0.7)
Eosinophils Relative: 3.1 % (ref 0.0–5.0)
HCT: 42.7 % (ref 36.0–46.0)
Hemoglobin: 14.3 g/dL (ref 12.0–15.0)
Lymphocytes Relative: 40.3 % (ref 12.0–46.0)
Lymphs Abs: 2 10*3/uL (ref 0.7–4.0)
MCHC: 33.6 g/dL (ref 30.0–36.0)
MCV: 84.9 fl (ref 78.0–100.0)
Monocytes Absolute: 0.5 10*3/uL (ref 0.1–1.0)
Monocytes Relative: 9.3 % (ref 3.0–12.0)
Neutro Abs: 2.3 10*3/uL (ref 1.4–7.7)
Neutrophils Relative %: 46.8 % (ref 43.0–77.0)
Platelets: 262 10*3/uL (ref 150.0–400.0)
RBC: 5.02 Mil/uL (ref 3.87–5.11)
RDW: 13.9 % (ref 11.5–15.5)
WBC: 4.9 10*3/uL (ref 4.0–10.5)

## 2016-10-04 LAB — COMPREHENSIVE METABOLIC PANEL
ALT: 23 U/L (ref 0–35)
AST: 18 U/L (ref 0–37)
Albumin: 4.2 g/dL (ref 3.5–5.2)
Alkaline Phosphatase: 49 U/L (ref 39–117)
BUN: 9 mg/dL (ref 6–23)
CO2: 29 mEq/L (ref 19–32)
Calcium: 9.5 mg/dL (ref 8.4–10.5)
Chloride: 106 mEq/L (ref 96–112)
Creatinine, Ser: 0.7 mg/dL (ref 0.40–1.20)
GFR: 110.14 mL/min (ref >60.00)
Glucose, Bld: 93 mg/dL (ref 70–99)
Potassium: 3.9 mEq/L (ref 3.5–5.1)
Sodium: 139 mEq/L (ref 135–145)
Total Bilirubin: 0.6 mg/dL (ref 0.2–1.2)
Total Protein: 6.9 g/dL (ref 6.0–8.3)

## 2016-10-04 LAB — TSH: TSH: 4.29 u[IU]/mL (ref 0.35–4.50)

## 2016-10-04 NOTE — Patient Instructions (Signed)
A few things to remember from today's visit:   Acute diarrhea - Plan: C difficile Toxins A+B W/Rflx, CBC w/Diff, Comprehensive metabolic panel, Giardia Antigen (Stool), Stool Culture, TSH    Symptoms are most likely viral and self-limited. Good hand hygiene is important, decrease risk of transmission. Small and frequent sips of clear fluids, Pedialyte or Gatorade age good options.  BRAT diet: bananas, rice, applesauce, and toast.  Imodium over the counter 1 tab witheach stool no more than 3 tabs daily.    Notified immediately or seek medical attention if you cannot keep fluids down, decreased urine output, severe abdominal pain, red bright blood per rectum, or changes in mental status.  Follow if not full recovery in 2-3 weeks.       Please be sure medication list is accurate. If a new problem present, please set up appointment sooner than planned today.

## 2016-10-04 NOTE — Progress Notes (Signed)
HPI:  ACUTE VISIT:  Chief Complaint  Patient presents with  . stomach issues    diarrhea x few days, no otc products     Whitney Aguirre is a 25 y.o. female, who is here today with her husband complaining of about a week of persistent diarrhea.  "Afew days" ago she started with diarrhea, she states that she has about 10-15 loose stool daily, today so far she has had 2. She denies nausea,vomiting, abdominal pain, or urinary symptoms. Today she noted blood on tissue after wiping, burning sensation but no dyschezia.    No recent travel or swimming in rivers or lakes/pools. No sick contact. No suspicious food intake. No insect bite or swimming in rivers/lakes.   No new medications or recent abx treatment. Denies use of dietary supplements or cleanses.She mentions that she has tried to eat healthier and stop drinking regular sodas.  No associated fever, chills, myalgias or arthralgias,decreased urine output,dysuria,or gross hematuria. No blood or mucus in stools.  She states that she does "not feel bad." She has not tried OTC. Symptoms are otherwise stable.   She has Hx of anxiety and insomnia, recently she was started on Trazodone, which she has taken for about 3 weeks. Also she takes Metformin for PCOS. Hx of hypothyroidism, Currently she is on Levothyroxine. She is reporting follow-up visit with her endocrinologist in October 2017, reporting normal labs.  FHx negative for IBD or colon cancer. MGM with IBS. She denies prior history of IBS.   Review of Systems  Constitutional: Negative for appetite change, chills, fatigue, fever and unexpected weight change.  HENT: Negative for mouth sores, nosebleeds, sore throat and trouble swallowing.   Respiratory: Negative for cough, shortness of breath and wheezing.   Cardiovascular: Negative for chest pain, palpitations and leg swelling.  Gastrointestinal: Positive for diarrhea. Negative for abdominal distention,  abdominal pain, nausea and vomiting.  Endocrine: Negative for cold intolerance and heat intolerance.  Genitourinary: Negative for decreased urine volume, difficulty urinating and hematuria.  Musculoskeletal: Negative for gait problem and myalgias.  Skin: Negative for color change and rash.  Allergic/Immunologic: Negative for food allergies.  Neurological: Negative for syncope, weakness and headaches.  Hematological: Negative for adenopathy. Does not bruise/bleed easily.  Psychiatric/Behavioral: Negative for confusion. The patient is nervous/anxious.       Current Outpatient Prescriptions on File Prior to Visit  Medication Sig Dispense Refill  . albuterol (PROVENTIL HFA;VENTOLIN HFA) 108 (90 Base) MCG/ACT inhaler Inhale 1-2 puffs into the lungs every 4 (four) hours as needed for wheezing or shortness of breath. 1 Inhaler 2  . fluticasone (FLONASE) 50 MCG/ACT nasal spray Place 1 spray into both nostrils 2 (two) times daily. 16 g 3  . LEVOTHYROXINE SODIUM PO Take 1 tablet by mouth daily.    . metFORMIN (GLUCOPHAGE) 500 MG tablet Take 500 mg by mouth daily with breakfast.    . traZODone (DESYREL) 50 MG tablet Take 0.5-1 tablets (25-50 mg total) by mouth at bedtime as needed for sleep. 30 tablet 1   No current facility-administered medications on file prior to visit.      Past Medical History:  Diagnosis Date  . ACL tear 05/12/2014   injured on trampoline  . Anemia    no current med.  . Anxiety   . Depression   . Headache(784.0)    stress  . Polycystic ovarian syndrome    no current med.   No Known Allergies  Social History   Social History  .  Marital status: Single    Spouse name: N/A  . Number of children: 0  . Years of education: College/RN   Occupational History  .  [employer]    ED-RN   Social History Main Topics  . Smoking status: Current Every Day Smoker    Types: Cigarettes  . Smokeless tobacco: Never Used  . Alcohol use No  . Drug use: No  . Sexual  activity: Not Asked   Other Topics Concern  . None   Social History Narrative   Patient lives at home with family.   Caffeine Use:1-2 cups    Vitals:   10/04/16 0931  BP: 124/80  Pulse: 78  Resp: 12   Body mass index is 38.08 kg/m.    Physical Exam  Nursing note and vitals reviewed. Constitutional: She is oriented to person, place, and time. She appears well-developed. She does not appear ill. No distress.  HENT:  Head: Atraumatic.  Mouth/Throat: Oropharynx is clear and moist and mucous membranes are normal.  Eyes: Conjunctivae are normal. No scleral icterus.  Neck: No thyroid mass and no thyromegaly present.  Cardiovascular: Normal rate and regular rhythm.   No murmur heard. Respiratory: Effort normal and breath sounds normal. No respiratory distress.  GI: Soft. Bowel sounds are normal. She exhibits no distension and no mass. There is no hepatomegaly. There is no tenderness.  Musculoskeletal: She exhibits no edema.  Lymphadenopathy:    She has no cervical adenopathy.       Right: No supraclavicular adenopathy present.       Left: No supraclavicular adenopathy present.  Neurological: She is alert and oriented to person, place, and time. She has normal strength. Coordination and gait normal.  Skin: Skin is warm. No rash noted. No erythema.  Psychiatric: She has a normal mood and affect.  Well groomed, good eye contact.      ASSESSMENT AND PLAN:     Whitney Aguirre was seen today for stomach issues.  Diagnoses and all orders for this visit:  Acute diarrhea -     C difficile Toxins A+B W/Rflx -     CBC w/Diff -     Comprehensive metabolic panel -     Giardia Antigen (Stool); Future -     Stool Culture; Future -     TSH; Future -     Giardia Antigen (Stool) -     Stool Culture  Hypothyroidism, unspecified type -     TSH    We discussed possible causes: Infectious, inflammatory illness, anxiety related, medications among some.   For now no changes in current  medications except Metformin, recommended considering holding it for a few days. Adequate hydration. OTC Imodium might help, some side effects discussed. Explained that this is usually self-limited but if persistent she may need GI evaluation. States that she does not need note for work. Some lab work done today, further recommendations will be given accordingly. Instructed about warning signs. Follow-up as needed.    Return if symptoms worsen or fail to improve.     -Whitney Aguirre advised to return or notify a doctor immediately if symptoms worsen or persist or new concerns arise.       Brick Ketcher G. [place], MD  [facility]. [place] office.

## 2016-10-04 NOTE — Progress Notes (Signed)
Pre visit review using our clinic review tool, if applicable. No additional management support is needed unless otherwise documented below in the visit note. 

## 2016-11-27 DIAGNOSIS — E063 Autoimmune thyroiditis: Secondary | ICD-10-CM

## 2016-11-27 HISTORY — DX: Autoimmune thyroiditis: E06.3

## 2016-12-11 DIAGNOSIS — M79671 Pain in right foot: Secondary | ICD-10-CM | POA: Diagnosis not present

## 2016-12-21 DIAGNOSIS — E282 Polycystic ovarian syndrome: Secondary | ICD-10-CM | POA: Diagnosis not present

## 2016-12-21 DIAGNOSIS — E039 Hypothyroidism, unspecified: Secondary | ICD-10-CM | POA: Diagnosis not present

## 2016-12-28 DIAGNOSIS — E039 Hypothyroidism, unspecified: Secondary | ICD-10-CM | POA: Diagnosis not present

## 2016-12-28 DIAGNOSIS — N912 Amenorrhea, unspecified: Secondary | ICD-10-CM | POA: Diagnosis not present

## 2016-12-28 DIAGNOSIS — E282 Polycystic ovarian syndrome: Secondary | ICD-10-CM | POA: Diagnosis not present

## 2016-12-28 DIAGNOSIS — E669 Obesity, unspecified: Secondary | ICD-10-CM | POA: Diagnosis not present

## 2017-01-04 DIAGNOSIS — M79671 Pain in right foot: Secondary | ICD-10-CM | POA: Diagnosis not present

## 2017-01-10 ENCOUNTER — Other Ambulatory Visit: Payer: Self-pay | Admitting: Family Medicine

## 2017-01-10 DIAGNOSIS — G4700 Insomnia, unspecified: Secondary | ICD-10-CM

## 2017-01-25 DIAGNOSIS — E039 Hypothyroidism, unspecified: Secondary | ICD-10-CM | POA: Diagnosis not present

## 2017-01-25 DIAGNOSIS — E282 Polycystic ovarian syndrome: Secondary | ICD-10-CM | POA: Diagnosis not present

## 2017-01-25 DIAGNOSIS — N912 Amenorrhea, unspecified: Secondary | ICD-10-CM | POA: Diagnosis not present

## 2017-01-25 DIAGNOSIS — E049 Nontoxic goiter, unspecified: Secondary | ICD-10-CM | POA: Diagnosis not present

## 2017-01-25 DIAGNOSIS — E669 Obesity, unspecified: Secondary | ICD-10-CM | POA: Diagnosis not present

## 2017-01-29 ENCOUNTER — Other Ambulatory Visit: Payer: Self-pay | Admitting: Endocrinology

## 2017-01-29 ENCOUNTER — Other Ambulatory Visit (HOSPITAL_COMMUNITY): Payer: Self-pay | Admitting: Endocrinology

## 2017-01-29 DIAGNOSIS — E049 Nontoxic goiter, unspecified: Secondary | ICD-10-CM

## 2017-02-02 ENCOUNTER — Telehealth: Payer: 59 | Admitting: Nurse Practitioner

## 2017-02-02 ENCOUNTER — Ambulatory Visit (HOSPITAL_COMMUNITY)
Admission: RE | Admit: 2017-02-02 | Discharge: 2017-02-02 | Disposition: A | Discharge status: Home / Self Care | Payer: 59 | Source: Ambulatory Visit | Attending: Endocrinology | Admitting: Endocrinology

## 2017-02-02 DIAGNOSIS — R05 Cough: Secondary | ICD-10-CM | POA: Diagnosis not present

## 2017-02-02 DIAGNOSIS — E049 Nontoxic goiter, unspecified: Secondary | ICD-10-CM | POA: Diagnosis not present

## 2017-02-02 DIAGNOSIS — R059 Cough, unspecified: Secondary | ICD-10-CM

## 2017-02-02 DIAGNOSIS — J069 Acute upper respiratory infection, unspecified: Secondary | ICD-10-CM | POA: Diagnosis not present

## 2017-02-02 DIAGNOSIS — B9689 Other specified bacterial agents as the cause of diseases classified elsewhere: Secondary | ICD-10-CM

## 2017-02-02 DIAGNOSIS — J0100 Acute maxillary sinusitis, unspecified: Secondary | ICD-10-CM

## 2017-02-02 DIAGNOSIS — E079 Disorder of thyroid, unspecified: Secondary | ICD-10-CM | POA: Diagnosis not present

## 2017-02-02 MED ORDER — ALBUTEROL SULFATE HFA 108 (90 BASE) MCG/ACT IN AERS: 1.0000 | INHALATION_SPRAY | RESPIRATORY_TRACT | 2 refills | Status: DC | PRN

## 2017-02-02 MED ORDER — PREDNISONE 10 MG (21) PO TBPK: ORAL_TABLET | ORAL | 0 refills | Status: DC

## 2017-02-02 MED ORDER — AZITHROMYCIN 250 MG PO TABS: ORAL_TABLET | ORAL | 0 refills | Status: DC

## 2017-02-02 NOTE — Addendum Note (Signed)
Addended by: Bennie PieriniMARTIN, MARY-MARGARET on: 02/02/2017 07:05 PM   Modules accepted: Orders

## 2017-02-02 NOTE — Progress Notes (Signed)
We are sorry that you are not feeling well.  Here is how we plan to help!  Based on what you have shared with me it looks like you have upper respiratory tract inflammation that has resulted in a significant cough.  Inflammation and infection in the upper respiratory tract is commonly called bronchitis and has four common causes:  Allergies, Viral Infections, Acid Reflux and Bacterial Infections.  Allergies, viruses and acid reflux are treated by controlling symptoms or eliminating the cause. An example might be a cough caused by taking certain blood pressure medications. You stop the cough by changing the medication. Another example might be a cough caused by acid reflux. Controlling the reflux helps control the cough.  Based on your presentation I believe you most likely have A cough due to bacteria.  When patients have a fever and a productive cough with a change in color or increased sputum production, we are concerned about bacterial bronchitis.  If left untreated it can progress to pneumonia.  If your symptoms do not improve with your treatment plan it is important that you contact your provider.   I have prescribed Azithromyin 250 mg: two tables now and then one tablet daily for 4 additonal days    In addition you may use A non-prescription cough medication called Mucinex DM: take 2 tablets every 12 hours.  Sterapred 10 mg dosepak  USE OF BRONCHODILATOR ("RESCUE") INHALERS: There is a risk from using your bronchodilator too frequently.  The risk is that over-reliance on a medication which only relaxes the muscles surrounding the breathing tubes can reduce the effectiveness of medications prescribed to reduce swelling and congestion of the tubes themselves.  Although you feel brief relief from the bronchodilator inhaler, your asthma may actually be worsening with the tubes becoming more swollen and filled with mucus.  This can delay other crucial treatments, such as oral steroid medications. If you  need to use a bronchodilator inhaler daily, several times per day, you should discuss this with your provider.  There are probably better treatments that could be used to keep your asthma under control.     HOME CARE . Only take medications as instructed by your medical team. . Complete the entire course of an antibiotic. . Drink plenty of fluids and get plenty of rest. . Avoid close contacts especially the very young and the elderly . Cover your mouth if you cough or cough into your sleeve. . Always remember to wash your hands . A steam or ultrasonic humidifier can help congestion.   GET HELP RIGHT AWAY IF: . You develop worsening fever. . You become short of breath . You cough up blood. . Your symptoms persist after you have completed your treatment plan MAKE SURE YOU   Understand these instructions.  Will watch your condition.  Will get help right away if you are not doing well or get worse.  Your e-visit answers were reviewed by a board certified advanced clinical practitioner to complete your personal care plan.  Depending on the condition, your plan could have included both over the counter or prescription medications. If there is a problem please reply  once you have received a response from your provider. Your safety is important to us.  If you have drug allergies check your prescription carefully.    You can use MyChart to ask questions about today's visit, request a non-urgent call back, or ask for a work or school excuse for 24 hours related to this e-Visit. If   it has been greater than 24 hours you will need to follow up with your provider, or enter a new e-Visit to address those concerns. You will get an e-mail in the next two days asking about your experience.  I hope that your e-visit has been valuable and will speed your recovery. Thank you for using e-visits.  

## 2017-02-26 DIAGNOSIS — E039 Hypothyroidism, unspecified: Secondary | ICD-10-CM | POA: Diagnosis not present

## 2017-03-07 DIAGNOSIS — Z6837 Body mass index (BMI) 37.0-37.9, adult: Secondary | ICD-10-CM | POA: Diagnosis not present

## 2017-03-07 DIAGNOSIS — Z113 Encounter for screening for infections with a predominantly sexual mode of transmission: Secondary | ICD-10-CM | POA: Diagnosis not present

## 2017-03-07 DIAGNOSIS — Z01419 Encounter for gynecological examination (general) (routine) without abnormal findings: Secondary | ICD-10-CM | POA: Diagnosis not present

## 2017-03-16 ENCOUNTER — Encounter: Payer: Self-pay | Admitting: Family Medicine

## 2017-06-06 DIAGNOSIS — Z3201 Encounter for pregnancy test, result positive: Secondary | ICD-10-CM | POA: Diagnosis not present

## 2017-06-07 ENCOUNTER — Other Ambulatory Visit: Payer: Self-pay | Admitting: Family Medicine

## 2017-06-07 DIAGNOSIS — G4700 Insomnia, unspecified: Secondary | ICD-10-CM

## 2017-06-28 DIAGNOSIS — E282 Polycystic ovarian syndrome: Secondary | ICD-10-CM | POA: Diagnosis not present

## 2017-06-28 DIAGNOSIS — E039 Hypothyroidism, unspecified: Secondary | ICD-10-CM | POA: Diagnosis not present

## 2017-07-05 DIAGNOSIS — E039 Hypothyroidism, unspecified: Secondary | ICD-10-CM | POA: Diagnosis not present

## 2017-07-05 DIAGNOSIS — E282 Polycystic ovarian syndrome: Secondary | ICD-10-CM | POA: Diagnosis not present

## 2017-07-05 DIAGNOSIS — E669 Obesity, unspecified: Secondary | ICD-10-CM | POA: Diagnosis not present

## 2017-07-10 ENCOUNTER — Encounter: Payer: Self-pay | Admitting: Family Medicine

## 2017-08-09 ENCOUNTER — Ambulatory Visit (INDEPENDENT_AMBULATORY_CARE_PROVIDER_SITE_OTHER): Payer: 59 | Admitting: Family Medicine

## 2017-08-09 ENCOUNTER — Encounter: Payer: Self-pay | Admitting: Family Medicine

## 2017-08-09 VITALS — BP 120/60 | HR 75 | Temp 98.2°F | Ht 67.0 in | Wt 249.6 lb

## 2017-08-09 DIAGNOSIS — N3000 Acute cystitis without hematuria: Secondary | ICD-10-CM | POA: Diagnosis not present

## 2017-08-09 DIAGNOSIS — R300 Dysuria: Secondary | ICD-10-CM | POA: Diagnosis not present

## 2017-08-09 LAB — POCT URINALYSIS DIPSTICK
Bilirubin, UA: NEGATIVE
Glucose, UA: NEGATIVE
Ketones, UA: NEGATIVE
Nitrite, UA: POSITIVE
Protein, UA: 100
Spec Grav, UA: 1.02 (ref 1.010–1.025)
Urobilinogen, UA: 0.2 E.U./dL
pH, UA: 6 (ref 5.0–8.0)

## 2017-08-09 MED ORDER — NITROFURANTOIN MONOHYD MACRO 100 MG PO CAPS: 100.0000 mg | ORAL_CAPSULE | Freq: Two times a day (BID) | ORAL | 0 refills | Status: DC

## 2017-08-09 NOTE — Progress Notes (Signed)
HPI:  Acute visit for dysuria: -x1 days -symptoms include frequency, urgency, dysuria -denies: fevers, malaise, flank pain, hematuria, vaginal symptoms, pregnancy -FDLMP about 3 weeks ago  ROS: See pertinent positives and negatives per HPI.  Past Medical History:  Diagnosis Date  . ACL tear 05/12/2014   injured on trampoline  . Anemia    no current med.  . Anxiety   . Depression   . Headache(784.0)    stress  . Polycystic ovarian syndrome    no current med.    Past Surgical History:  Procedure Laterality Date  . KNEE ARTHROSCOPY WITH ANTERIOR CRUCIATE LIGAMENT (ACL) REPAIR Left 06/05/2014   Procedure: LEFT KNEE ARTHROSCOPY WITH ANTERIOR CRUCIATE LIGAMENT (ACL) REPAIR;  Surgeon: Harvie JuniorJohn L Graves, MD;  Location: [facility];  Service: Orthopedics;  Laterality: Left;  . WISDOM TOOTH EXTRACTION      Family History  Problem Relation Age of Onset  . Cardiomyopathy Mother   . High blood pressure Mother   . Emphysema Mother   . Ulcers Father   . Cancer Paternal Grandfather     Social History   Social History  . Marital status: Married    Spouse name: N/A  . Number of children: 0  . Years of education: College/RN   Occupational History  .  [employer]    ED-RN   Social History Main Topics  . Smoking status: Current Every Day Smoker    Types: Cigarettes  . Smokeless tobacco: Never Used  . Alcohol use No  . Drug use: No  . Sexual activity: Not Asked   Other Topics Concern  . None   Social History Narrative   Patient lives at home with family.   Caffeine Use:1-2 cups     Current Outpatient Prescriptions:  .  albuterol (PROVENTIL HFA;VENTOLIN HFA) 108 (90 Base) MCG/ACT inhaler, Inhale 1-2 puffs into the lungs every 4 (four) hours as needed for wheezing or shortness of breath., Disp: 1 Inhaler, Rfl: 2 .  LEVOTHYROXINE SODIUM PO, Take 1 tablet by mouth daily., Disp: , Rfl:  .  metFORMIN (GLUCOPHAGE) 500 MG tablet, Take 500 mg by mouth daily with  breakfast., Disp: , Rfl:  .  traZODone (DESYREL) 50 MG tablet, TAKE 1/2-1 TABLET BY MOUTH AT BEDTIME AS NEEDED FOR SLEEP., Disp: 30 tablet, Rfl: 2 .  nitrofurantoin, macrocrystal-monohydrate, (MACROBID) 100 MG capsule, Take 1 capsule (100 mg total) by mouth 2 (two) times daily., Disp: 14 capsule, Rfl: 0  EXAM:  Vitals:   08/09/17 1642  BP: 120/60  Pulse: 75  Temp: 98.2 F (36.8 C)    Body mass index is 39.09 kg/m.  GENERAL: vitals reviewed and listed above, alert, oriented, appears well hydrated and in no acute distress  HEENT: atraumatic, conjunttiva clear, no obvious abnormalities on inspection of external nose and ears  NECK: no obvious masses on inspection  LUNGS: clear to auscultation bilaterally, no wheezes, rales or rhonchi, good air movement  ABD: BS+, soft, NTTP, no CVA TTP  CV: HRRR, no peripheral edema  MS: moves all extremities without noticeable abnormality  PSYCH: pleasant and cooperative, no obvious depression or anxiety  ASSESSMENT AND PLAN:  Discussed the following assessment and plan:  Dysuria - Plan: POC Urinalysis Dipstick  Acute cystitis without hematuria  -udip + symptoms c/w UTI -tx with abx and azo after discussion risks/benefits/return precuations -Patient advised to return or notify a doctor immediately if symptoms worsen or persist or new concerns arise.  Patient Instructions  Start the antibiotic  and take as instructed.  Azo for symptoms if needed.  I hope you are feeling better soon! Seek care immediately if worsening, new concerns or you are not improving with treatment.   Urinary Tract Infection, Adult A urinary tract infection (UTI) is an infection of any part of the urinary tract. The urinary tract includes the:  Kidneys.  Ureters.  Bladder.  Urethra.  These organs make, store, and get rid of pee (urine) in the body. Follow these instructions at home:  Take over-the-counter and prescription medicines only as told by  your doctor.  If you were prescribed an antibiotic medicine, take it as told by your doctor. Do not stop taking the antibiotic even if you start to feel better.  Avoid the following drinks: ? Alcohol. ? Caffeine. ? Tea. ? Carbonated drinks.  Drink enough fluid to keep your pee clear or pale yellow.  Keep all follow-up visits as told by your doctor. This is important.  Make sure to: ? Empty your bladder often and completely. Do not to hold pee for long periods of time. ? Empty your bladder before and after sex. ? Wipe from front to back after a bowel movement if you are female. Use each tissue one time when you wipe. Contact a doctor if:  You have back pain.  You have a fever.  You feel sick to your stomach (nauseous).  You throw up (vomit).  Your symptoms do not get better after 3 days.  Your symptoms go away and then come back. Get help right away if:  You have very bad back pain.  You have very bad lower belly (abdominal) pain.  You are throwing up and cannot keep down any medicines or water. This information is not intended to replace advice given to you by your health care provider. Make sure you discuss any questions you have with your health care provider. Document Released: 05/01/2008 Document Revised: 04/20/2016 Document Reviewed: 10/04/2015 Elsevier Interactive Patient Education  [address]     [place], Dahlia Client R., DO

## 2017-08-09 NOTE — Patient Instructions (Signed)
Start the antibiotic and take as instructed.  Azo for symptoms if needed.  I hope you are feeling better soon! Seek care immediately if worsening, new concerns or you are not improving with treatment.   Urinary Tract Infection, Adult A urinary tract infection (UTI) is an infection of any part of the urinary tract. The urinary tract includes the:  Kidneys.  Ureters.  Bladder.  Urethra.  These organs make, store, and get rid of pee (urine) in the body. Follow these instructions at home:  Take over-the-counter and prescription medicines only as told by your doctor.  If you were prescribed an antibiotic medicine, take it as told by your doctor. Do not stop taking the antibiotic even if you start to feel better.  Avoid the following drinks: ? Alcohol. ? Caffeine. ? Tea. ? Carbonated drinks.  Drink enough fluid to keep your pee clear or pale yellow.  Keep all follow-up visits as told by your doctor. This is important.  Make sure to: ? Empty your bladder often and completely. Do not to hold pee for long periods of time. ? Empty your bladder before and after sex. ? Wipe from front to back after a bowel movement if you are female. Use each tissue one time when you wipe. Contact a doctor if:  You have back pain.  You have a fever.  You feel sick to your stomach (nauseous).  You throw up (vomit).  Your symptoms do not get better after 3 days.  Your symptoms go away and then come back. Get help right away if:  You have very bad back pain.  You have very bad lower belly (abdominal) pain.  You are throwing up and cannot keep down any medicines or water. This information is not intended to replace advice given to you by your health care provider. Make sure you discuss any questions you have with your health care provider. Document Released: 05/01/2008 Document Revised: 04/20/2016 Document Reviewed: 10/04/2015 Elsevier Interactive Patient Education  [employer]2018 Elsevier  Inc.

## 2017-08-10 ENCOUNTER — Ambulatory Visit: Payer: 59 | Admitting: Family Medicine

## 2017-08-16 ENCOUNTER — Encounter: Payer: Self-pay | Admitting: Family Medicine

## 2017-08-21 ENCOUNTER — Encounter: Payer: Self-pay | Admitting: Family Medicine

## 2017-08-22 ENCOUNTER — Encounter: Payer: Self-pay | Admitting: Family Medicine

## 2017-08-27 ENCOUNTER — Ambulatory Visit (INDEPENDENT_AMBULATORY_CARE_PROVIDER_SITE_OTHER): Payer: 59 | Admitting: Family Medicine

## 2017-08-27 ENCOUNTER — Encounter: Payer: Self-pay | Admitting: Family Medicine

## 2017-08-27 VITALS — BP 124/80 | HR 100 | Resp 12 | Ht 67.0 in | Wt 243.0 lb

## 2017-08-27 DIAGNOSIS — E039 Hypothyroidism, unspecified: Secondary | ICD-10-CM | POA: Diagnosis not present

## 2017-08-27 DIAGNOSIS — R002 Palpitations: Secondary | ICD-10-CM | POA: Diagnosis not present

## 2017-08-27 DIAGNOSIS — G4700 Insomnia, unspecified: Secondary | ICD-10-CM

## 2017-08-27 LAB — CBC
HCT: 39.9 % (ref 36.0–46.0)
Hemoglobin: 13.4 g/dL (ref 12.0–15.0)
MCHC: 33.7 g/dL (ref 30.0–36.0)
MCV: 84.2 fl (ref 78.0–100.0)
Platelets: 243 10*3/uL (ref 150.0–400.0)
RBC: 4.74 Mil/uL (ref 3.87–5.11)
RDW: 14.4 % (ref 11.5–15.5)
WBC: 7 10*3/uL (ref 4.0–10.5)

## 2017-08-27 LAB — BASIC METABOLIC PANEL
BUN: 14 mg/dL (ref 6–23)
CO2: 28 mEq/L (ref 19–32)
Calcium: 9.2 mg/dL (ref 8.4–10.5)
Chloride: 104 mEq/L (ref 96–112)
Creatinine, Ser: 0.66 mg/dL (ref 0.40–1.20)
GFR: 116.97 mL/min (ref >60.00)
Glucose, Bld: 90 mg/dL (ref 70–99)
Potassium: 4 mEq/L (ref 3.5–5.1)
Sodium: 138 mEq/L (ref 135–145)

## 2017-08-27 MED ORDER — TRAZODONE HCL 50 MG PO TABS: ORAL_TABLET | ORAL | 4 refills | Status: DC

## 2017-08-27 NOTE — Patient Instructions (Addendum)
A few things to remember from today's visit:   Heart palpitations - Plan: EKG 12-Lead, Basic metabolic panel, CBC, Ambulatory referral to Cardiology  Hypothyroidism, unspecified type   Palpitations A palpitation is the feeling that your heart:  Has an uneven (irregular) heartbeat.  Is beating faster than normal.  Is fluttering.  Is skipping a beat.  This is usually not a serious problem. In some cases, you may need more medical tests. Follow these instructions at home:  Avoid: ? Caffeine in coffee, tea, soft drinks, diet pills, and energy drinks. ? Chocolate. ? Alcohol.  Do not use any tobacco products. These include cigarettes, chewing tobacco, and e-cigarettes. If you need help quitting, ask your doctor.  Try to reduce your stress. These things may help: ? Yoga. ? Meditation. ? Physical activity. Swimming, jogging, and walking are good choices. ? A method that helps you use your mind to control things in your body, like heartbeats (biofeedback).  Get plenty of rest and sleep.  Take over-the-counter and prescription medicines only as told by your doctor.  Keep all follow-up visits as told by your doctor. This is important. Contact a doctor if:  Your heartbeat is still fast or uneven after 24 hours.  Your palpitations occur more often. Get help right away if:  You have chest pain.  You feel short of breath.  You have a very bad headache.  You feel dizzy.  You pass out (faint). This information is not intended to replace advice given to you by your health care provider. Make sure you discuss any questions you have with your health care provider. Document Released: 08/22/2008 Document Revised: 04/20/2016 Document Reviewed: 07/29/2015 Elsevier Interactive Patient Education  2018 [employer].  Please be sure medication list is accurate. If a new problem present, please set up appointment sooner than planned today.

## 2017-08-27 NOTE — Progress Notes (Signed)
ACUTE VISIT   HPI:  Chief Complaint  Patient presents with  . Palpitations    Ms.Whitney Whitney Aguirre is a 26 y.o. female, who is here today complaining of worsening palpitations.  According to patient, she has had intermittent palpitations for a couple years but about a week ago she had an episode that was worse than those she has had in the past.  She is not sure about exacerbating or alleviating factors. She feels like it is getting worse.  Usually associated with dizziness, denies associated chest pain or diaphoresis. She is consuming the same amount of caffeinated drinks she usually does.  She denies any new stress or new medications, including OTC cold medications or herbs remedies. She has not tried any OTC medication.   Palpitations   This is a recurrent problem. The current episode started in the past 7 days. The problem occurs intermittently. The problem has been gradually worsening. Nothing aggravates the symptoms. Associated symptoms include anxiety and dizziness. Pertinent negatives include no chest pain, coughing, fever, nausea, shortness of breath, vomiting or weakness. She has tried nothing for the symptoms. Risk factors include family history and obesity. Her past medical history is significant for anxiety.   She has history of hypothyroidism, she follows with Dr. Talmage Aguirre. Last TSH about a month ago within normal limits. Currently she is on Levothyroxine 200 g daily for 6 days and 100 g on Sundays.   He is concerned because her mother suffers from a "inherited" cardiomyopathy and had a pacemaker placed a 10-15 years ago.  Insomnia:  She is currently on Trazodone 50 mg at bedtime, which is helping with her sleep. She takes it daily and denies side effects. Last follow-up 09/15/2016.    Review of Systems  Constitutional: Positive for fatigue. Negative for activity change, appetite change, fever and unexpected weight change.  HENT: Negative for mouth sores,  nosebleeds and trouble swallowing.   Eyes: Negative for redness and visual disturbance.  Respiratory: Negative for cough, shortness of breath and wheezing.   Cardiovascular: Positive for palpitations. Negative for chest pain and leg swelling.  Gastrointestinal: Negative for abdominal pain, nausea and vomiting.       Negative for changes in bowel habits.  Endocrine: Negative for cold intolerance and heat intolerance.  Genitourinary: Negative for decreased urine volume and hematuria.  Musculoskeletal: Negative for gait problem and myalgias.  Skin: Negative for pallor and rash.  Neurological: Positive for dizziness. Negative for syncope, weakness and headaches.  Psychiatric/Behavioral: Negative for confusion. The patient is nervous/anxious.       Current Outpatient Prescriptions on File Prior to Visit  Medication Sig Dispense Refill  . albuterol (PROVENTIL HFA;VENTOLIN HFA) 108 (90 Base) MCG/ACT inhaler Inhale 1-2 puffs into the lungs every 4 (four) hours as needed for wheezing or shortness of breath. 1 Inhaler 2  . LEVOTHYROXINE SODIUM PO Take 1 tablet by mouth daily.    . metFORMIN (GLUCOPHAGE) 500 MG tablet Take 500 mg by mouth daily with breakfast.     No current facility-administered medications on file prior to visit.      Past Medical History:  Diagnosis Date  . ACL tear 05/12/2014   injured on trampoline  . Anemia    no current med.  . Anxiety   . Depression   . Headache(784.0)    stress  . Polycystic ovarian syndrome    no current med.   No Known Allergies  Social History   Social History  . Marital  status: Married    Spouse name: N/A  . Number of children: 0  . Years of education: College/RN   Occupational History  .  [employer]    ED-RN   Social History Main Topics  . Smoking status: Current Every Day Smoker    Types: Cigarettes  . Smokeless tobacco: Never Used  . Alcohol use No  . Drug use: No  . Sexual activity: Not Asked   Other Topics Concern    . None   Social History Narrative   Patient lives at home with family.   Caffeine Use:1-2 cups    Vitals:   08/27/17 1419 08/27/17 1503  BP: 124/80   Pulse: (!) 106 100  Resp: 12   SpO2: 99%    Body mass index is 38.06 kg/m.   Physical Exam  Nursing note and vitals reviewed. Constitutional: She is oriented to person, place, and time. She appears well-developed. No distress.  HENT:  Head: Normocephalic and atraumatic.  Mouth/Throat: Oropharynx is clear and moist and mucous membranes are normal.  Eyes: Pupils are equal, round, and reactive to light. Conjunctivae are normal.  Cardiovascular: Normal rate and regular rhythm.   No murmur heard. Pulses:      Dorsalis pedis pulses are 2+ on the right side, and 2+ on the left side.  Respiratory: Effort normal and breath sounds normal. No respiratory distress.  GI: Soft. She exhibits no mass. There is no hepatomegaly. There is no tenderness.  Musculoskeletal: She exhibits no edema.  Lymphadenopathy:    She has no cervical adenopathy.  Neurological: She is alert and oriented to person, place, and time. She has normal strength. Coordination and gait normal.  Skin: Skin is warm. No erythema.  Psychiatric: Her Whitney Aguirre appears anxious.  Well groomed, good eye contact.    ASSESSMENT AND PLAN:   Ms. Whitney Whitney Aguirre was seen today for palpitations.  Diagnoses and all orders for this visit:  Lab Results  Component Value Date   WBC 7.0 08/27/2017   HGB 13.4 08/27/2017   HCT 39.9 08/27/2017   MCV 84.2 08/27/2017   PLT 243.0 08/27/2017   Lab Results  Component Value Date   CREATININE 0.66 08/27/2017   BUN 14 08/27/2017   NA 138 08/27/2017   K 4.0 08/27/2017   CL 104 08/27/2017   CO2 28 08/27/2017    Heart palpitations  We discussed possible etiologies. Recommended decreasing caffeine intake. Adequate hydration. EKG today:Sinus arrhythmia , normal axis and intervals, ? IVCD. Compared with EKG 12/2011 PAC's not present at this  time. Cardiology referral placed. Instructed about warning signs.  -     EKG 12-Lead -     Basic metabolic panel -     CBC -     Ambulatory referral to Cardiology  Hypothyroidism, unspecified type  According to patient, she recently had thyroid function evaluated by her endocrinologist and in normal range. No changes in current management. Continue following with Dr. Talmage Aguirre.  Insomnia, unspecified type  Was controlled with current management. No changes in Trazodone Dose. Good sleep hygiene also recommended. Follow-Up in 6 Months.  -     traZODone (DESYREL) 50 MG tablet; TAKE 1/2-1 TABLET BY MOUTH AT BEDTIME AS NEEDED FOR SLEEP.     Return in about 6 months (around 02/25/2018), or if symptoms worsen or fail to improve, for Insomnia.   -Ms.Whitney Whitney Aguirre was advised to seek immediate medical attention if sudden worsening symptoms.      Evangelyn Crouse G. [place], MD    [facility]. [place] office.

## 2017-08-27 NOTE — Progress Notes (Signed)
Cardiology Office Note    Date:  08/29/2017   ID:  Whitney Aguirre, DOB Nov 07, 1991, MRN [id number]  PCP:  [place], Betty G, MD  Cardiologist: New to Dr. Delton See  Chief Complaint: Palpitaitons  History of Present Illness:   Whitney Aguirre is a 26 y.o. female with past medical history of hypothyroidism (follow by Dr. Talmage Nap) deferred by PCP Dr. [place] for evaluation of palpitation.  Mother has hx of "inherited" cardiomyopathy requiring pacemaker in her early 23s.  Seen by Dr. Tenny Craw once February 2013 for chest pain that felt atypical likely musculoskeletal in nature. Follow-up echocardiogram showed normal LV function without structure or wall motion abnormality.  Here today for evaluation of palpitation. Patient has intermittent palpitations for the past few months. Occurs for 2-5 seconds. Associated with shortness of breath. No chest pain or dizziness. She drinks one cup of coffee and occasional caffeinated soda. No improvement despite cutback. Patient denies orthopnea, PND, syncope, lower extremity edema, melena, headache. Not associated with food intake or menstrual cycle. Recently adjusted levothyroxine for abnormal thyroid level approximately 4-6 weeks ago.   Past Medical History:  Diagnosis Date  . ACL tear 05/12/2014   injured on trampoline  . Anemia    no current med.  . Anxiety   . Depression   . Headache(784.0)    stress  . Polycystic ovarian syndrome    no current med.    Past Surgical History:  Procedure Laterality Date  . KNEE ARTHROSCOPY WITH ANTERIOR CRUCIATE LIGAMENT (ACL) REPAIR Left 06/05/2014   Procedure: LEFT KNEE ARTHROSCOPY WITH ANTERIOR CRUCIATE LIGAMENT (ACL) REPAIR;  Surgeon: Harvie Junior, MD;  Location: [facility];  Service: Orthopedics;  Laterality: Left;  . WISDOM TOOTH EXTRACTION      Current Medications: Prior to Admission medications   Medication Sig Start Date End Date Taking? Authorizing Provider  albuterol (PROVENTIL  HFA;VENTOLIN HFA) 108 (90 Base) MCG/ACT inhaler Inhale 1-2 puffs into the lungs every 4 (four) hours as needed for wheezing or shortness of breath. 02/02/17   Daphine Deutscher, Mary-Margaret, FNP  LEVOTHYROXINE SODIUM PO Take 1 tablet by mouth daily.    [provider]  metFORMIN (GLUCOPHAGE) 500 MG tablet Take 500 mg by mouth daily with breakfast.    [provider]  traZODone (DESYREL) 50 MG tablet TAKE 1/2-1 TABLET BY MOUTH AT BEDTIME AS NEEDED FOR SLEEP. 08/27/17   [place], Betty G, MD    Allergies:   Patient has no known allergies.   Social History   Social History  . Marital status: Married    Spouse name: N/A  . Number of children: 0  . Years of education: College/RN   Occupational History  .  [employer]    ED-RN   Social History Main Topics  . Smoking status: Current Every Day Smoker    Types: Cigarettes  . Smokeless tobacco: Never Used  . Alcohol use No  . Drug use: No  . Sexual activity: Not Asked   Other Topics Concern  . None   Social History Narrative   Patient lives at home with family.   Caffeine Use:1-2 cups     Family History:  The patient's family history includes Cancer in her paternal grandfather; Cardiomyopathy in her mother; Emphysema in her mother; High blood pressure in her mother; Ulcers in her father.   ROS:   Please see the history of present illness.    ROS All other systems reviewed and are negative.   PHYSICAL EXAM:  VS:  BP 118/60   Pulse 64   Ht  (1.702 m)   Wt 247 lb (112 kg)   SpO2 98%   BMI 38.69 kg/m    GEN: Well nourished, well developed, in no acute distress  HEENT: normal  Neck: no JVD, carotid bruits, or masses Cardiac: RRR; no murmurs, rubs, or gallops,no edema  Respiratory:  clear to auscultation bilaterally, normal work of breathing GI: soft, nontender, nondistended, + BS MS: no deformity or atrophy  Skin: warm and dry, no rash Neuro:  Alert and Oriented x 3, Strength and sensation are intact Psych:  euthymic mood, full affect  Wt Readings from Last 3 Encounters:  08/29/17 247 lb (112 kg)  08/27/17 243 lb (110.2 kg)  08/09/17 249 lb 9.6 oz (113.2 kg)      Studies/Labs Reviewed:   EKG:  EKG is not  ordered today.   EKG 08/27/17: Sinus rhythm at controlled ventricular rate.   Recent Labs: 10/04/2016: ALT 23; TSH 4.29 08/27/2017: BUN 14; Creatinine, Ser 0.66; Hemoglobin 13.4; Platelets 243.0; Potassium 4.0; Sodium 138   Lipid Panel No results found for: CHOL, TRIG, HDL, CHOLHDL, VLDL, LDLCALC, LDLDIRECT  Additional studies/ records that were reviewed today include:   Echocardiogram: 01/2012  - Left ventricle: The cavity size was normal. Wall thickness was normal. Systolic function was normal. The estimated ejection fraction was in the range of 55% to 60%. Wall motion was normal; there were no regional wall motion abnormalities. Left ventricular diastolic function parameters were normal. - Aortic valve: Trivial regurgitation.    ASSESSMENT & PLAN:    1. Palpitation - No improvement despite cutback on caffeinated products. Her symptoms last for less than 10 seconds. Recently adjusted supplemental thyroid. Will check labs. Advise complete cessation of caffeine. Get a 48-hour Holter monitor and echocardiogram. Prior Echo in 2013 was normal.  2. Hypothyroidism - Check labs. Continue current dose. If abnormal, will send lab to Dr. Talmage Nap.   Reviewed with DOD Dr. Delton See.   Medication Adjustments/Labs and Tests Ordered: Current medicines are reviewed at length with the patient today.  Concerns regarding medicines are outlined above.  Medication changes, Labs and Tests ordered today are listed in the Patient Instructions below. Patient Instructions  Medication Instructions: Your physician recommends that you continue on your current medications as directed. Please refer to the Current Medication list given to you today.  Labwork: Your physician recommends that you  have lab work today: Free T3, Free T4, and TSH  Procedures/Testing: Your physician has requested that you have an echocardiogram. Echocardiography is a painless test that uses sound waves to create images of your heart. It provides your doctor with information about the size and shape of your heart and how well your heart's chambers and valves are working. This procedure takes approximately one hour. There are no restrictions for this procedure.  Your physician has recommended that you wear a 48 hr holter monitor. Holter monitors are medical devices that record the heart's electrical activity. Doctors most often use these monitors to diagnose arrhythmias. Arrhythmias are problems with the speed or rhythm of the heartbeat. The monitor is a small, portable device. You can wear one while you do your normal daily activities. This is usually used to diagnose what is causing palpitations/syncope (passing out).  Follow-Up: Your physician recommends that you schedule a follow-up appointment based off your testing results   If you need a refill on your cardiac medications before your next appointment, please call your pharmacy.  Lorelei Pont, [place]  08/29/2017 1:33 PM    [facility] [address] [place], [place], [place]  [address] Phone: [phone]; Fax: [phone]

## 2017-08-29 ENCOUNTER — Encounter: Payer: Self-pay | Admitting: Physician Assistant

## 2017-08-29 ENCOUNTER — Ambulatory Visit (INDEPENDENT_AMBULATORY_CARE_PROVIDER_SITE_OTHER): Payer: 59 | Admitting: Physician Assistant

## 2017-08-29 VITALS — BP 118/60 | HR 64 | Ht 67.0 in | Wt 247.0 lb

## 2017-08-29 DIAGNOSIS — E039 Hypothyroidism, unspecified: Secondary | ICD-10-CM

## 2017-08-29 DIAGNOSIS — R002 Palpitations: Secondary | ICD-10-CM

## 2017-08-29 NOTE — Patient Instructions (Addendum)
Medication Instructions: Your physician recommends that you continue on your current medications as directed. Please refer to the Current Medication list given to you today.  Labwork: Your physician recommends that you have lab work today: Free T3, Free T4, and TSH  Procedures/Testing: Your physician has requested that you have an echocardiogram. Echocardiography is a painless test that uses sound waves to create images of your heart. It provides your doctor with information about the size and shape of your heart and how well your heart's chambers and valves are working. This procedure takes approximately one hour. There are no restrictions for this procedure.  Your physician has recommended that you wear a 48 hr holter monitor. Holter monitors are medical devices that record the heart's electrical activity. Doctors most often use these monitors to diagnose arrhythmias. Arrhythmias are problems with the speed or rhythm of the heartbeat. The monitor is a small, portable device. You can wear one while you do your normal daily activities. This is usually used to diagnose what is causing palpitations/syncope (passing out).  Follow-Up: Your physician recommends that you schedule a follow-up appointment based off your testing results   If you need a refill on your cardiac medications before your next appointment, please call your pharmacy.

## 2017-08-30 LAB — TSH: TSH: 1.67 u[IU]/mL (ref 0.450–4.500)

## 2017-08-30 LAB — T3, FREE: T3, Free: 3.1 pg/mL (ref 2.0–4.4)

## 2017-08-30 LAB — T4, FREE: Free T4: 1.44 ng/dL (ref 0.82–1.77)

## 2017-09-04 ENCOUNTER — Ambulatory Visit (HOSPITAL_COMMUNITY): Payer: 59 | Attending: Internal Medicine

## 2017-09-04 ENCOUNTER — Ambulatory Visit (INDEPENDENT_AMBULATORY_CARE_PROVIDER_SITE_OTHER): Payer: 59

## 2017-09-04 ENCOUNTER — Other Ambulatory Visit: Payer: Self-pay

## 2017-09-04 DIAGNOSIS — R002 Palpitations: Secondary | ICD-10-CM | POA: Diagnosis not present

## 2017-09-04 DIAGNOSIS — Z720 Tobacco use: Secondary | ICD-10-CM | POA: Insufficient documentation

## 2017-09-05 ENCOUNTER — Encounter: Payer: Self-pay | Admitting: Physician Assistant

## 2017-09-13 DIAGNOSIS — G5602 Carpal tunnel syndrome, left upper limb: Secondary | ICD-10-CM | POA: Diagnosis not present

## 2017-10-03 DIAGNOSIS — Z30431 Encounter for routine checking of intrauterine contraceptive device: Secondary | ICD-10-CM | POA: Diagnosis not present

## 2017-10-09 DIAGNOSIS — E039 Hypothyroidism, unspecified: Secondary | ICD-10-CM | POA: Diagnosis not present

## 2017-10-29 DIAGNOSIS — N930 Postcoital and contact bleeding: Secondary | ICD-10-CM | POA: Diagnosis not present

## 2017-10-29 DIAGNOSIS — Z30431 Encounter for routine checking of intrauterine contraceptive device: Secondary | ICD-10-CM | POA: Diagnosis not present

## 2017-11-09 ENCOUNTER — Encounter: Payer: Self-pay | Admitting: Family Medicine

## 2017-11-09 ENCOUNTER — Ambulatory Visit: Payer: Commercial Managed Care - PPO | Admitting: Emergency Medicine

## 2017-11-10 ENCOUNTER — Other Ambulatory Visit: Payer: Self-pay

## 2017-11-10 ENCOUNTER — Encounter: Payer: Self-pay | Admitting: Family Medicine

## 2017-11-10 ENCOUNTER — Ambulatory Visit (INDEPENDENT_AMBULATORY_CARE_PROVIDER_SITE_OTHER): Payer: 59 | Admitting: Family Medicine

## 2017-11-10 VITALS — BP 112/72 | HR 70 | Temp 98.0°F | Ht 66.93 in | Wt 248.0 lb

## 2017-11-10 DIAGNOSIS — R300 Dysuria: Secondary | ICD-10-CM | POA: Diagnosis not present

## 2017-11-10 DIAGNOSIS — R1030 Lower abdominal pain, unspecified: Secondary | ICD-10-CM

## 2017-11-10 LAB — POC MICROSCOPIC URINALYSIS (UMFC): Mucus: ABSENT

## 2017-11-10 LAB — POCT URINALYSIS DIP (MANUAL ENTRY)
Bilirubin, UA: NEGATIVE
Glucose, UA: NEGATIVE mg/dL
Ketones, POC UA: NEGATIVE mg/dL
Leukocytes, UA: NEGATIVE
Nitrite, UA: NEGATIVE
Protein Ur, POC: NEGATIVE mg/dL
Spec Grav, UA: 1.025 (ref 1.010–1.025)
Urobilinogen, UA: 0.2 E.U./dL
pH, UA: 6.5 (ref 5.0–8.0)

## 2017-11-10 LAB — POCT WET + KOH PREP
Trich by wet prep: ABSENT
Yeast by KOH: ABSENT
Yeast by wet prep: ABSENT

## 2017-11-10 MED ORDER — NITROFURANTOIN MONOHYD MACRO 100 MG PO CAPS: 100.0000 mg | ORAL_CAPSULE | Freq: Two times a day (BID) | ORAL | 0 refills | Status: DC

## 2017-11-10 NOTE — Patient Instructions (Signed)
     IF you received an x-ray today, you will receive an invoice from [facility]. Please contact [facility] at [phone] with questions or concerns regarding your invoice.   IF you received labwork today, you will receive an invoice from [facility]. Please contact [facility] at [phone] with questions or concerns regarding your invoice.   Our billing staff will not be able to assist you with questions regarding bills from these companies.  You will be contacted with the lab results as soon as they are available. The fastest way to get your results is to activate your My Chart account. Instructions are located on the last page of this paperwork. If you have not heard from us regarding the results in 2 weeks, please contact this office.     

## 2017-11-10 NOTE — Progress Notes (Signed)
12/15/201810:15 AM  Whitney Aguirre [birth year], 26 y.o. female [id number]  Chief Complaint  Patient presents with  . Urinary Tract Infection    Some cramping. Has IUD since Nov 2018    HPI:   Patient is a 26 y.o. female who presents today for 5 days of worsening lower abd cramping, dysruia and urinary frequency. Denies any changes in her vaginal discharge, pelvic pain, nausea, flank pain, hematuria, fever, chills.   Had a UTI in Sept but does not get them frequently. She is currently spotting.  Depression screen PHQ 2/9 11/10/2017  Decreased Interest 0  Down, Depressed, Hopeless 0  PHQ - 2 Score 0    No Known Allergies  Prior to Admission medications   Medication Sig Start Date End Date Taking? Authorizing Provider  LEVOTHYROXINE SODIUM PO Take 1 tablet by mouth daily.   Yes [provider]  metFORMIN (GLUCOPHAGE) 500 MG tablet Take 500 mg by mouth daily with breakfast.   Yes [provider]  traZODone (DESYREL) 50 MG tablet TAKE 1/2-1 TABLET BY MOUTH AT BEDTIME AS NEEDED FOR SLEEP. 08/27/17  Yes SwazilandJordan, Betty G, MD  Mirena  Past Medical History:  Diagnosis Date  . ACL tear 05/12/2014   injured on trampoline  . Headache(784.0)    stress  . Polycystic ovarian syndrome    no current med.    Past Surgical History:  Procedure Laterality Date  . KNEE ARTHROSCOPY WITH ANTERIOR CRUCIATE LIGAMENT (ACL) REPAIR Left 06/05/2014   Procedure: LEFT KNEE ARTHROSCOPY WITH ANTERIOR CRUCIATE LIGAMENT (ACL) REPAIR;  Surgeon: Harvie JuniorJohn L Graves, MD;  Location: [facility];  Service: Orthopedics;  Laterality: Left;  . WISDOM TOOTH EXTRACTION      Social History   Tobacco Use  . Smoking status: Current Every Day Smoker    Types: Cigarettes  . Smokeless tobacco: Never Used  Substance Use Topics  . Alcohol use: No    Alcohol/week: 0.0 oz    Family History  Problem Relation Age of Onset  . Cardiomyopathy Mother   . High blood pressure Mother   .  Emphysema Mother   . Ulcers Father   . Cancer Paternal Grandfather     ROS Per hpi  OBJECTIVE:  Blood pressure 112/72, pulse 70, temperature 98 F (36.7 C), temperature source Oral, height 5' 6.93" (1.7 m), weight 248 lb (112.5 kg), SpO2 99 %.  Physical Exam  Constitutional: She is oriented to person, place, and time and well-developed, well-nourished, and in no distress.  HENT:  Head: Normocephalic and atraumatic.  Mouth/Throat: Oropharynx is clear and moist. No oropharyngeal exudate.  Eyes: EOM are normal. Pupils are equal, round, and reactive to light. No scleral icterus.  Neck: Neck supple.  Cardiovascular: Normal rate, regular rhythm and normal heart sounds. Exam reveals no gallop and no friction rub.  No murmur heard. Pulmonary/Chest: Effort normal and breath sounds normal. She has no wheezes. She has no rales.  Abdominal: There is no CVA tenderness.  Musculoskeletal: She exhibits no edema.  Neurological: She is alert and oriented to person, place, and time. Gait normal.  Skin: Skin is warm and dry.    Results for orders placed or performed in visit on 11/10/17 (from the past 24 hour(s))  POCT urinalysis dipstick     Status: Abnormal   Collection Time: 11/10/17 10:14 AM  Result Value Ref Range   Color, UA yellow yellow   Clarity, UA cloudy (A) clear   Glucose, UA negative negative mg/dL  Bilirubin, UA negative negative   Ketones, POC UA negative negative mg/dL   Spec Grav, UA [id number] - 1.025   Blood, UA moderate (A) negative   pH, UA 6.5 5.0 - 8.0   Protein Ur, POC negative negative mg/dL   Urobilinogen, UA 0.2 0.2 or 1.0 E.U./dL   Nitrite, UA Negative Negative   Leukocytes, UA Negative Negative  POCT Microscopic Urinalysis ([facility])     Status: Abnormal   Collection Time: 11/10/17 10:21 AM  Result Value Ref Range   WBC,UR,HPF,POC None None WBC/hpf   RBC,UR,HPF,POC Few (A) None RBC/hpf   Bacteria Few (A) None, Too numerous to count   Mucus Absent Absent    Epithelial Cells, UR Per Microscopy Few (A) None, Too numerous to count cells/hpf  POCT Wet + KOH Prep     Status: None   Collection Time: 11/10/17 10:40 AM  Result Value Ref Range   Yeast by KOH Absent Absent   Yeast by wet prep Absent Absent   WBC by wet prep Few Few   Clue Cells Wet Prep HPF POC None None   Trich by wet prep Absent Absent   Bacteria Wet Prep HPF POC Few Few   Epithelial Cells By Principal FinancialWet Pref ([facility]) Few None, Few, Too numerous to count   RBC,UR,HPF,POC None None RBC/hpf    ASSESSMENT and PLAN 1. Lower abdominal pain Lab tests in clinic unremarkable, but given symptoms and mild bacteuria will treat empirically, sending urine for culture. Also checking for gc/ch.  - POCT urinalysis dipstick - POCT Microscopic Urinalysis ([facility]) - POCT Wet + KOH Prep - GC/Chlamydia Probe Amp  2. Dysuria - Urine Culture  Other orders - nitrofurantoin, macrocrystal-monohydrate, (MACROBID) 100 MG capsule; Take 1 capsule (100 mg total) by mouth 2 (two) times daily.   Return if symptoms worsen or fail to improve.    Myles LippsIrma M Santiago, MD [facility] [address] [place], [place] [address] Ph.  [phone] Fax [phone]

## 2017-11-13 ENCOUNTER — Other Ambulatory Visit: Payer: Self-pay | Admitting: Family Medicine

## 2017-11-13 ENCOUNTER — Encounter: Payer: Self-pay | Admitting: Family Medicine

## 2017-11-13 LAB — URINE CULTURE

## 2017-11-13 LAB — GC/CHLAMYDIA PROBE AMP
Chlamydia trachomatis, NAA: NEGATIVE
Neisseria gonorrhoeae by PCR: NEGATIVE

## 2017-11-13 MED ORDER — CEPHALEXIN 500 MG PO CAPS: 500.0000 mg | ORAL_CAPSULE | Freq: Two times a day (BID) | ORAL | 0 refills | Status: DC

## 2017-11-13 NOTE — Telephone Encounter (Signed)
Phone call to [facility], spoke with Lupita Leashonna. Called in cephalexin 500mg  prescribed by Dr. Leretha PolSantiago. Replied to patient via MyChart.

## 2017-11-30 IMAGING — US US THYROID
1 series · 13 of 25 positions shown · non-contrast
Comparison: 10/15/2015

CLINICAL DATA: Palpable abnormality. Palpable enlargement of the
left lobe of the thyroid.

EXAM:
THYROID ULTRASOUND
TECHNIQUE: Ultrasound examination of the thyroid gland and adjacent soft
tissues was performed.

[Series 1: us thyroid · 0.06mm/px · 58 acquisitions, 13 frames shown]
[im 1/58]
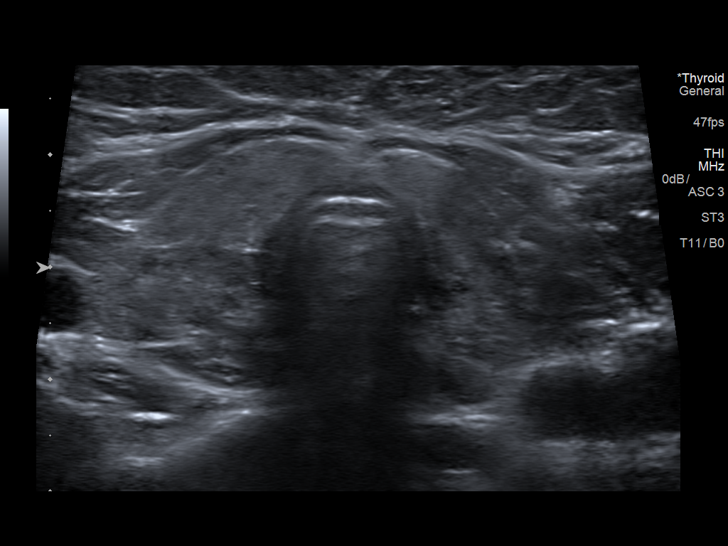
[im 5/58]
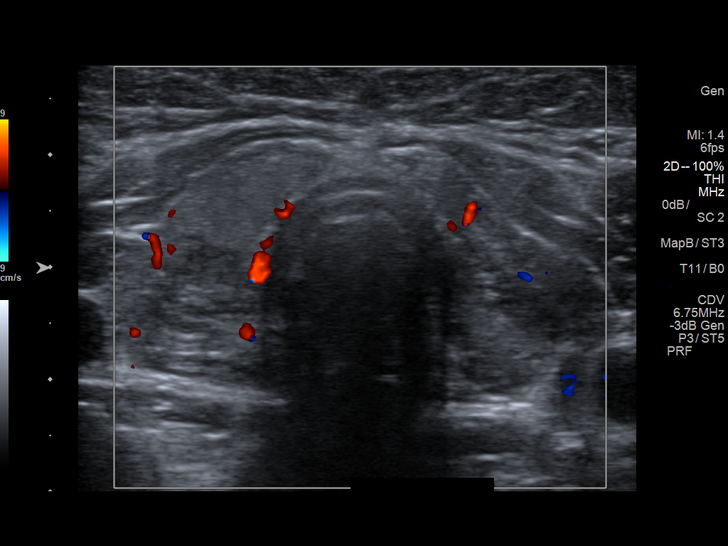
[im 10/58]
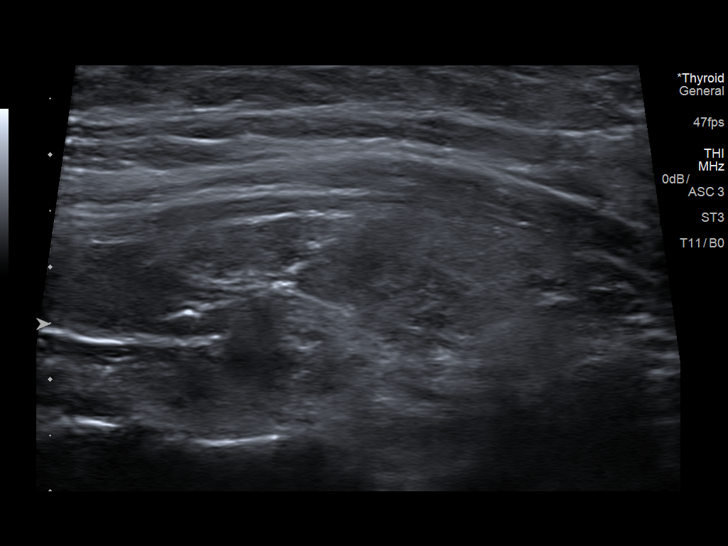
[im 15/58]
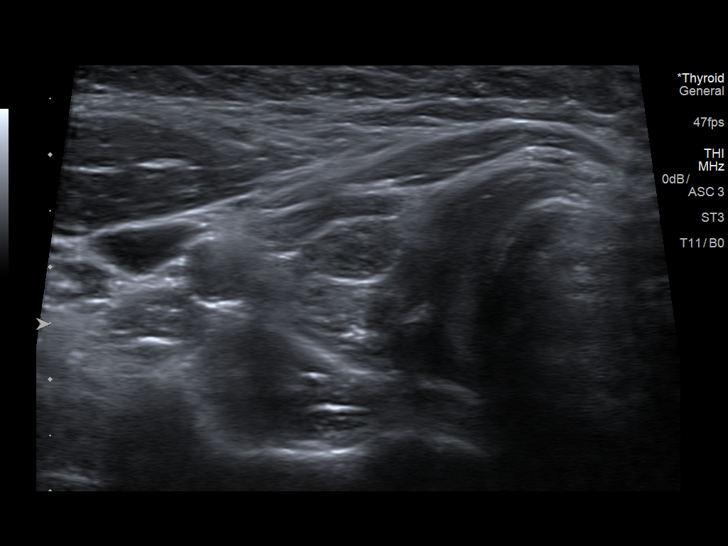
[im 20/58]
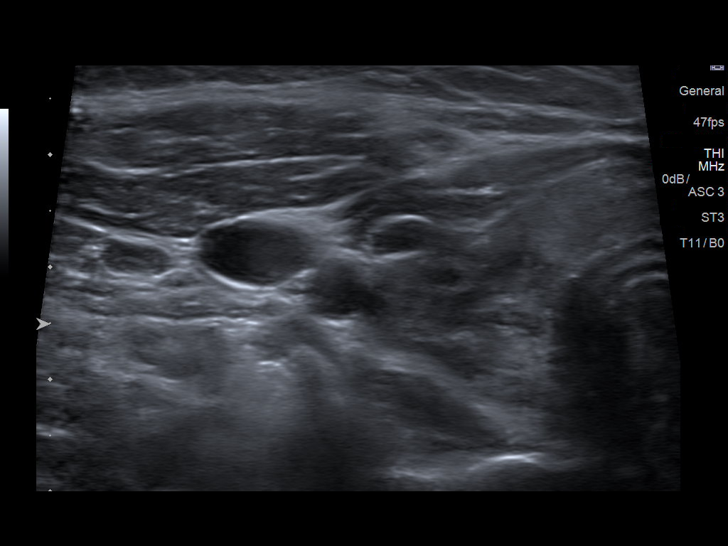
[im 24/58]
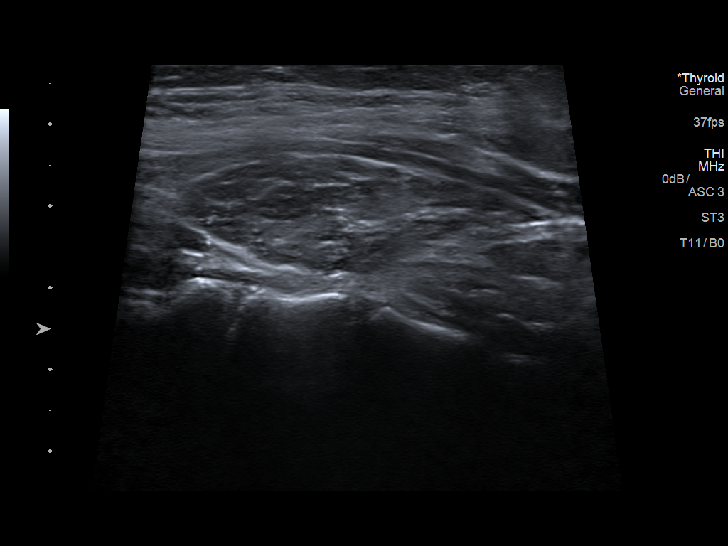
[im 29/58]
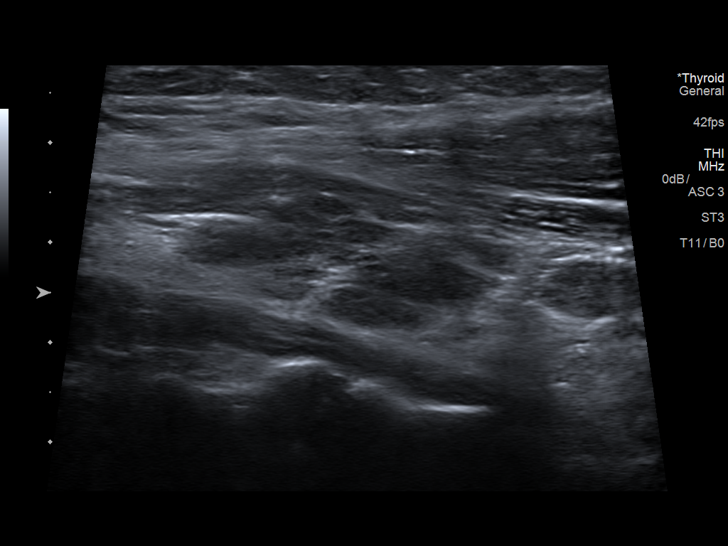
[im 34/58]
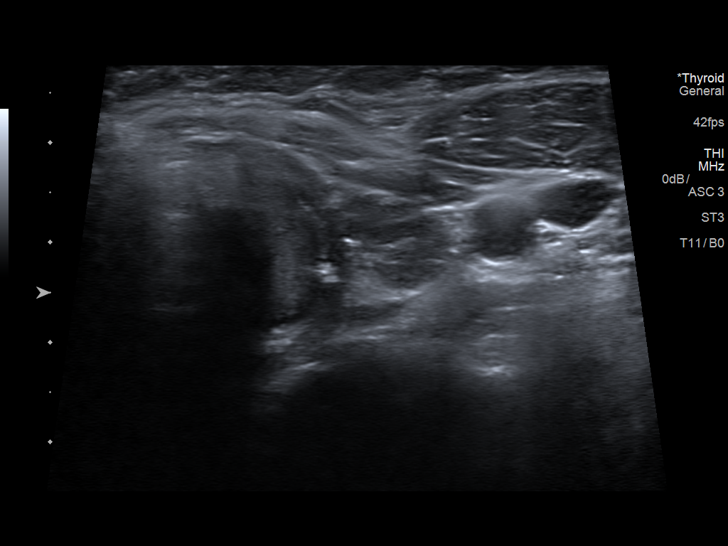
[im 39/58]
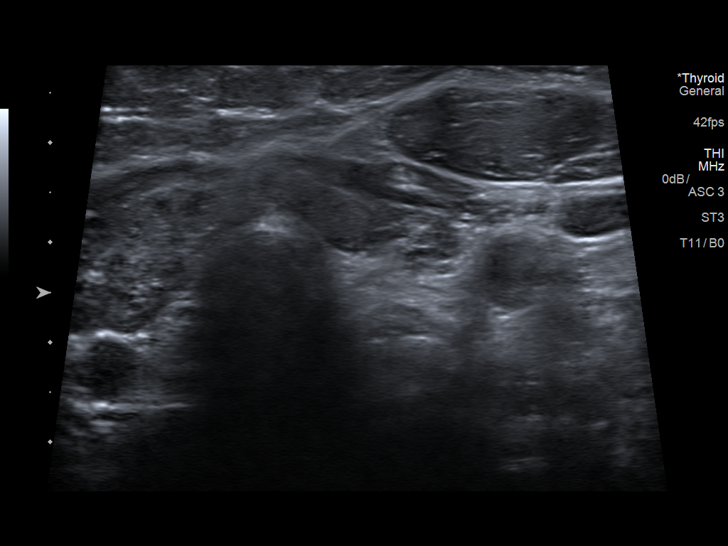
[im 43/58]
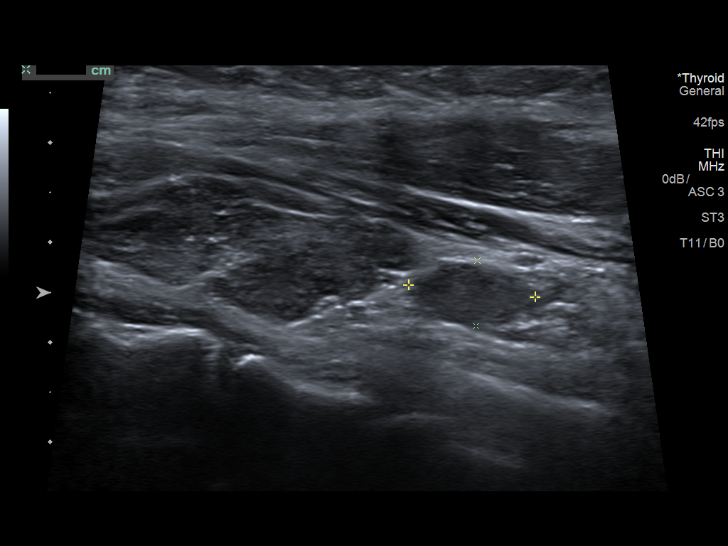
[im 48/58]
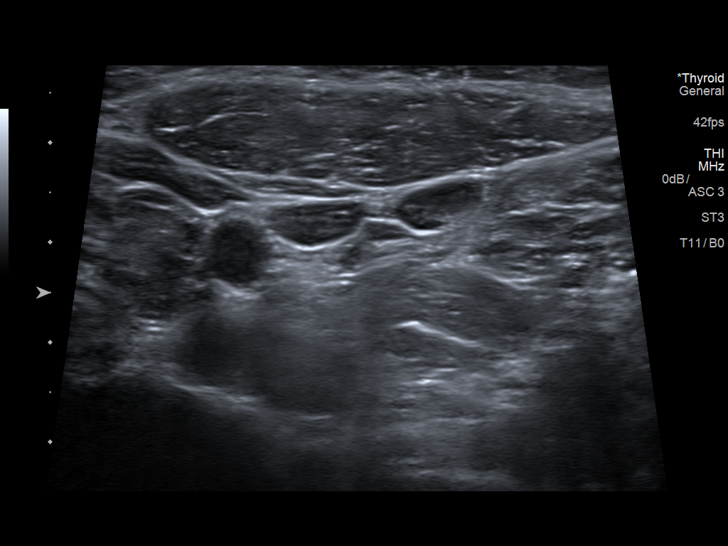
[im 53/58]
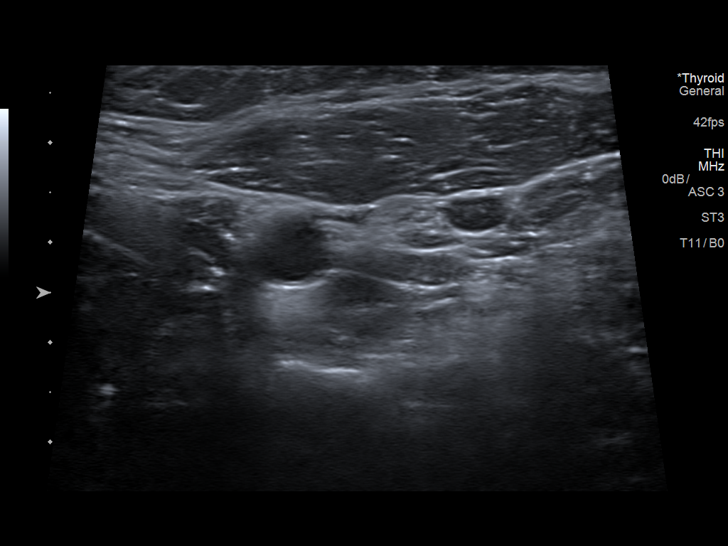
[im 58/58]
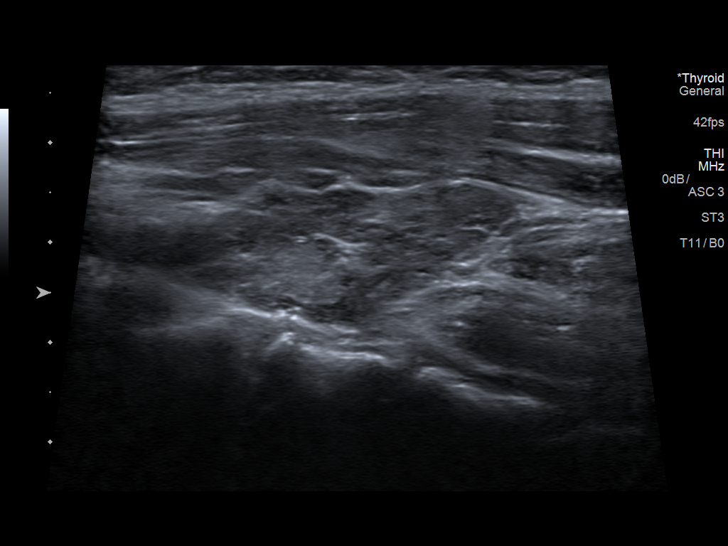

[13 of 25 positions shown; findings below may reference images not displayed]

FINDINGS: Parenchymal Echotexture: Markedly heterogenous

Isthmus: Normal in size measuring 0.4 cm in diameter, unchanged,
previously, 0.6 cm

Right lobe: Normal in size measuring 5.0 x 1.6 x 1.8 cm, unchanged,
previously, 4.3 x 1.3 x 2.0 cm

Left lobe: Normal in size measuring 4.4 x 1 4 x 1.6 cm, unchanged,
previously, 4.4 x 1.8 x 1.7 cm

_________________________________________________________

Estimated total number of nodules >/= 1 cm: 0

Number of spongiform nodules >/=  2 cm not described below (TR1): 0

Number of mixed cystic and solid nodules >/= 1.5 cm not described
below (TR2): 0

_________________________________________________________

Adjacent to the inferior pole the left lobe of the thyroid is a
well-defined approximately 1.3 x 0.6 x 0.7 cm nodule which may
represent either a nonenlarged cervical lymph node or (less likely)
a parathyroid adenoma. Several additional non pathologically
enlarged cervical lymph nodes are seen.

No additional sonographic correlate for patient's palpable area of
concern (representative images 54 through 59)
IMPRESSION: 1. Similar markedly heterogeneous appearing thyroid gland,
nonspecific though could be seen in the setting of thyroiditis.
2. Well-defined approximately 1.3 cm nodule adjacent to the inferior
pole the left lobe of the thyroid may represent a cervical lymph
node (favored) versus a parathyroid adenoma (less likely).

## 2018-01-22 DIAGNOSIS — E282 Polycystic ovarian syndrome: Secondary | ICD-10-CM | POA: Diagnosis not present

## 2018-01-22 DIAGNOSIS — E039 Hypothyroidism, unspecified: Secondary | ICD-10-CM | POA: Diagnosis not present

## 2018-01-29 DIAGNOSIS — E669 Obesity, unspecified: Secondary | ICD-10-CM | POA: Diagnosis not present

## 2018-01-29 DIAGNOSIS — E039 Hypothyroidism, unspecified: Secondary | ICD-10-CM | POA: Diagnosis not present

## 2018-01-29 DIAGNOSIS — R7301 Impaired fasting glucose: Secondary | ICD-10-CM | POA: Diagnosis not present

## 2018-01-29 DIAGNOSIS — E282 Polycystic ovarian syndrome: Secondary | ICD-10-CM | POA: Diagnosis not present

## 2018-02-27 IMAGING — DX DG CHEST 2V
2 series · 2 of 2 positions shown · non-contrast
Comparison: Chest x-ray of 12/29/2011

CLINICAL DATA: Cough for 2 weeks, substernal chest pain

EXAM:
CHEST  2 VIEW

[chest pa]
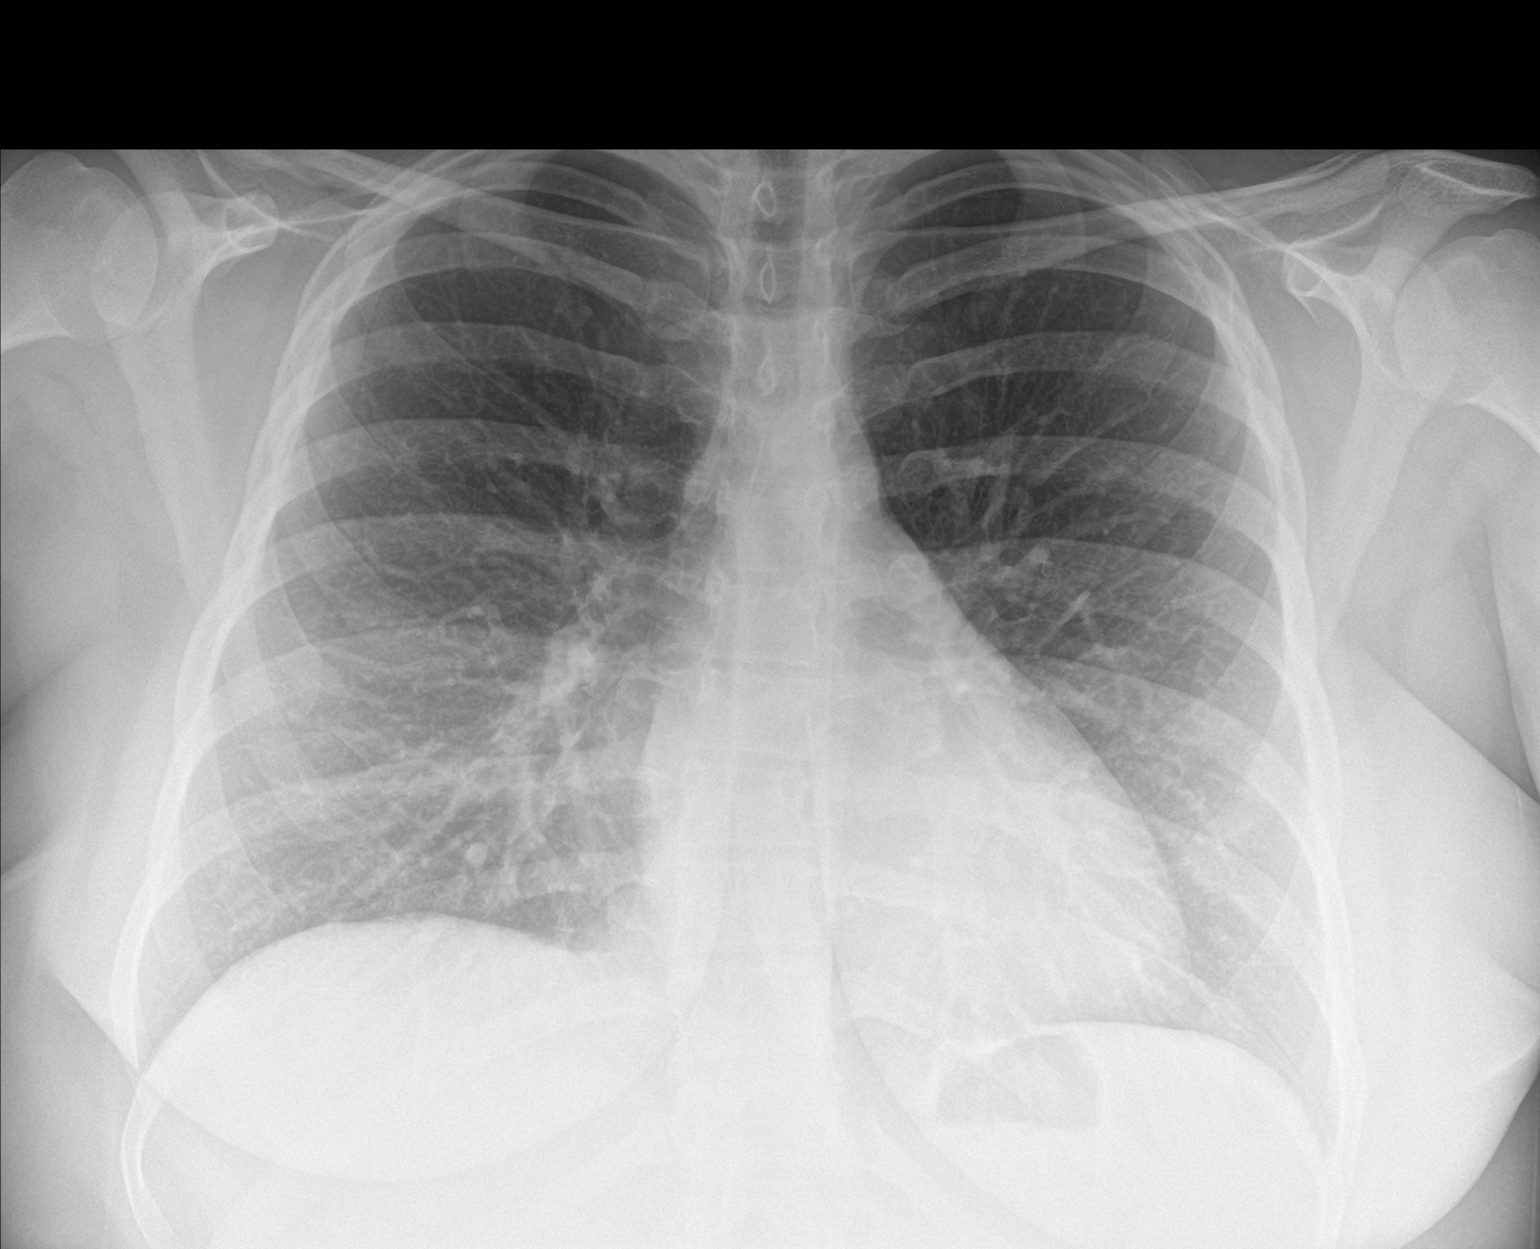

[chest lat]
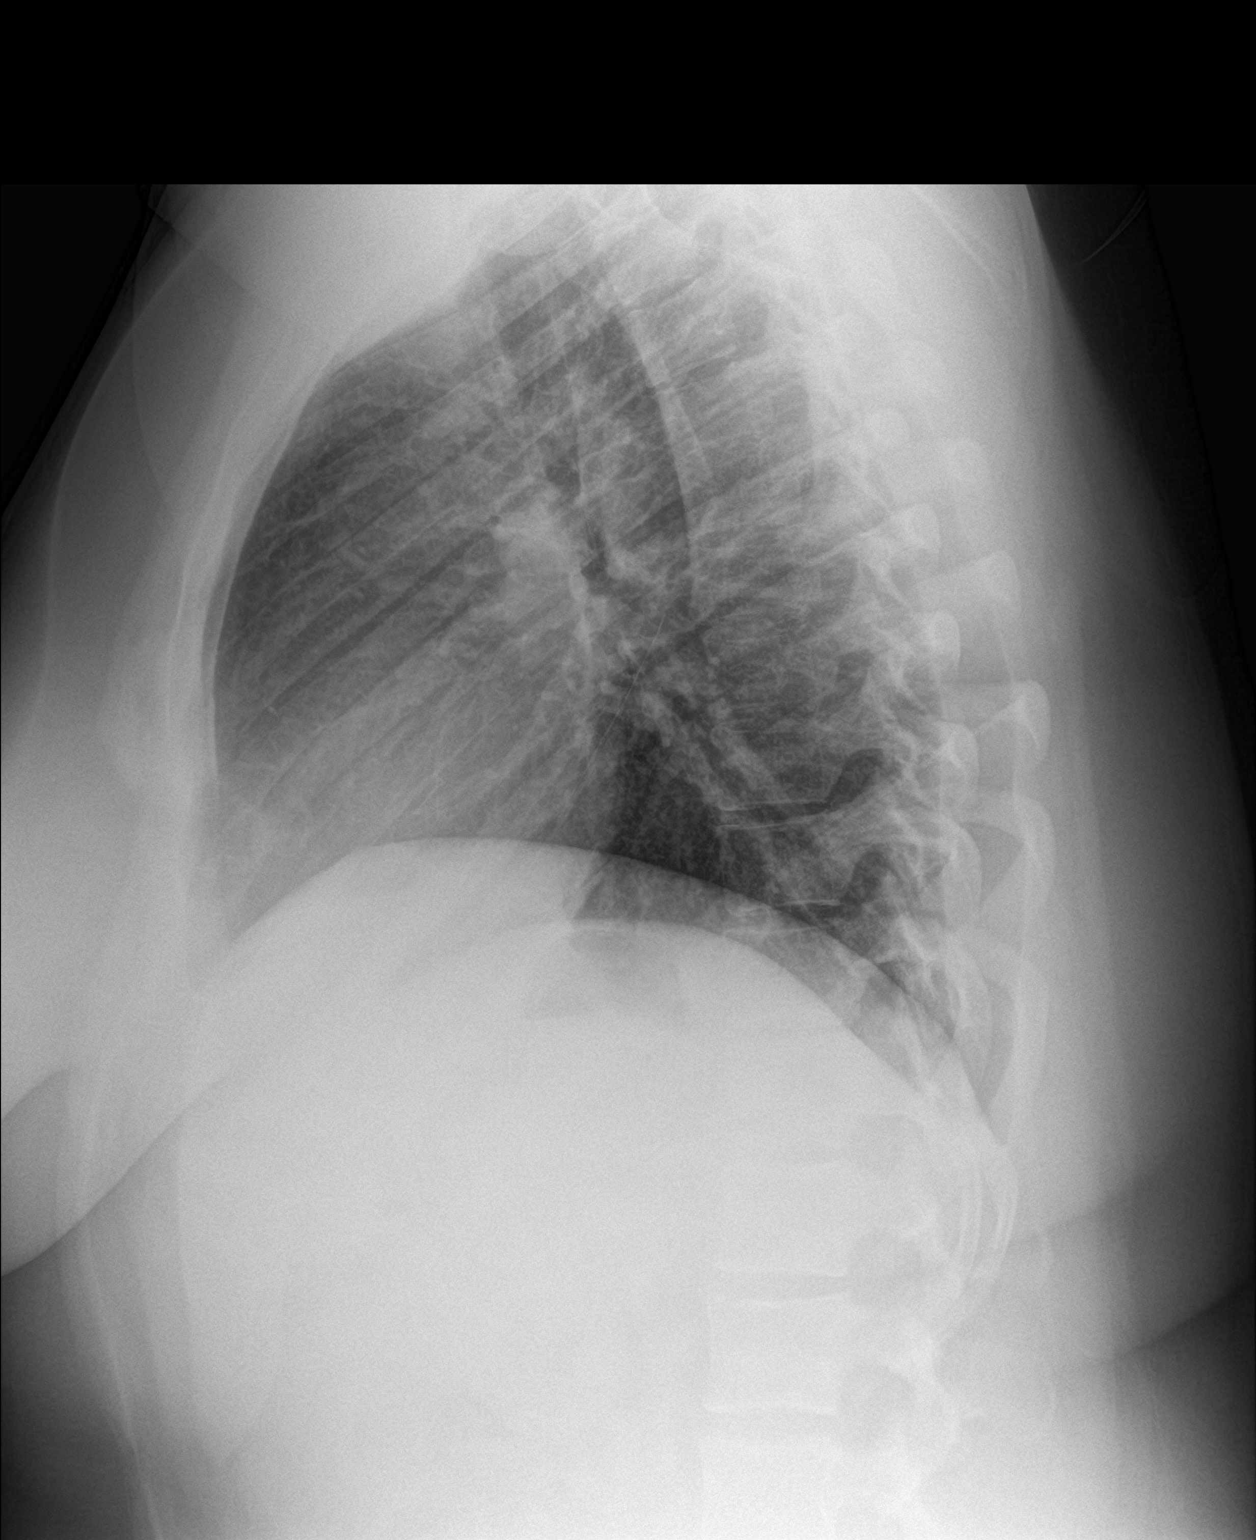

[2 of 2 positions shown; findings below may reference images not displayed]

FINDINGS: No active infiltrate or effusion is seen. Mediastinal and hilar
contours are unremarkable. The heart is within normal limits in
size. No bony abnormality is seen.
IMPRESSION: No active cardiopulmonary disease.

## 2018-03-10 NOTE — Progress Notes (Deleted)
HPI:   Whitney Aguirre is a 27 y.o. female, who is here today for her routine physical.  Last CPE: ***  Regular exercise 3 or more time per week: *** Following a healthy diet: *** She lives with ***  Chronic medical problems: Palpitations,insomnia,allergic rhinitis,and hypothyroidism among some. PCOS, she follows with endocrinologist.   She is on Trazodone 50 mg at bedtime as needed.  Mirena IUD.  Pap smear **** Hx of abnormal pap smears: *** Hx of STD's ***   There is no immunization history on file for this patient.    She has *** concerns today.     Review of Systems      Current Outpatient Medications on File Prior to Visit  Medication Sig Dispense Refill  . cephALEXin (KEFLEX) 500 MG capsule Take 1 capsule (500 mg total) by mouth 2 (two) times daily. 10 capsule 0  . levonorgestrel (MIRENA) 20 MCG/24HR IUD 1 each by Intrauterine route once.    Marland Kitchen LEVOTHYROXINE SODIUM PO Take 1 tablet by mouth daily.    . metFORMIN (GLUCOPHAGE) 500 MG tablet Take 500 mg by mouth daily with breakfast.    . traZODone (DESYREL) 50 MG tablet TAKE 1/2-1 TABLET BY MOUTH AT BEDTIME AS NEEDED FOR SLEEP. 30 tablet 4   No current facility-administered medications on file prior to visit.      Past Medical History:  Diagnosis Date  . ACL tear 05/12/2014   injured on trampoline  . Headache(784.0)    stress  . Polycystic ovarian syndrome    no current med.    Past Surgical History:  Procedure Laterality Date  . KNEE ARTHROSCOPY WITH ANTERIOR CRUCIATE LIGAMENT (ACL) REPAIR Left 06/05/2014   Procedure: LEFT KNEE ARTHROSCOPY WITH ANTERIOR CRUCIATE LIGAMENT (ACL) REPAIR;  Surgeon: Harvie Junior, MD;  Location: [facility];  Service: Orthopedics;  Laterality: Left;  . WISDOM TOOTH EXTRACTION      No Known Allergies  Family History  Problem Relation Age of Onset  . Cardiomyopathy Mother   . High blood pressure Mother   . Emphysema Mother   . Ulcers  Father   . Cancer Paternal Grandfather     Social History   Socioeconomic History  . Marital status: Married    Spouse name: Not on file  . Number of children: 0  . Years of education: College/RN  . Highest education level: Not on file  Occupational History    Employer: [employer]    Comment: ED-RN  Social Needs  . Financial resource strain: Not on file  . Food insecurity:    Worry: Not on file    Inability: Not on file  . Transportation needs:    Medical: Not on file    Non-medical: Not on file  Tobacco Use  . Smoking status: Current Every Day Smoker    Types: Cigarettes  . Smokeless tobacco: Never Used  Substance and Sexual Activity  . Alcohol use: No    Alcohol/week: 0.0 oz  . Drug use: No  . Sexual activity: Not on file  Lifestyle  . Physical activity:    Days per week: Not on file    Minutes per session: Not on file  . Stress: Not on file  Relationships  . Social connections:    Talks on phone: Not on file    Gets together: Not on file    Attends religious service: Not on file    Active member of club or organization: Not  on file    Attends meetings of clubs or organizations: Not on file    Relationship status: Not on file  Other Topics Concern  . Not on file  Social History Narrative   Patient lives at home with family.   Caffeine Use:1-2 cups     There were no vitals filed for this visit. There is no height or weight on file to calculate BMI.   Wt Readings from Last 3 Encounters:  11/10/17 248 lb (112.5 kg)  08/29/17 247 lb (112 kg)  08/27/17 243 lb (110.2 kg)      Physical Exam      ASSESSMENT AND PLAN:  Whitney Aguirre was here today annual physical examination.     No orders of the defined types were placed in this encounter.   There are no diagnoses linked to this encounter.    No problem-specific Assessment & Plan notes found for this encounter.            No follow-ups on file.          Sosha Shepherd  G. [place]Jordan, MD  [facility]. [place] office.

## 2018-03-11 ENCOUNTER — Encounter: Payer: 59 | Admitting: Family Medicine

## 2018-03-12 DIAGNOSIS — E039 Hypothyroidism, unspecified: Secondary | ICD-10-CM | POA: Diagnosis not present

## 2018-03-27 DIAGNOSIS — Z118 Encounter for screening for other infectious and parasitic diseases: Secondary | ICD-10-CM | POA: Diagnosis not present

## 2018-03-27 DIAGNOSIS — Z01419 Encounter for gynecological examination (general) (routine) without abnormal findings: Secondary | ICD-10-CM | POA: Diagnosis not present

## 2018-03-27 DIAGNOSIS — Z6839 Body mass index (BMI) 39.0-39.9, adult: Secondary | ICD-10-CM | POA: Diagnosis not present

## 2018-03-27 DIAGNOSIS — Z0184 Encounter for antibody response examination: Secondary | ICD-10-CM | POA: Diagnosis not present

## 2018-04-01 NOTE — Progress Notes (Addendum)
HPI:  Chief Complaint  Patient presents with  . Annual Exam    pressure in head when standing from a seated position   Whitney Aguirre is a 27 y.o. female, who is here today for her routine physical.She also has a few concerns.  Last CPE: 2015. She follows with gynecologist regularly.  Regular exercise 3 or more time per week: Work is sedentary, she signed for the gym today. Following a healthy diet: Not consistently but drinking more water. She lives with her husband.  Chronic medical problems: Palpitations,insomnia,allergic rhinitis, cardiac arrhythmia (she has follow with cardiologist), and hypothyroidism among some. PCOS, she follows with endocrinologist (Dr Talmage Nap).Whitney Aguirre IUD.  Pap smear last week.   There is no immunization history on file for this patient.   Concerns today:  2 months of lightheadedness, like parietal head pressure for a few seconds, 2-3 times per week. Rarely she starts with headache after episodes,migraine like HA, which she has Hx of.  No associated nausea, vomiting, focal deficit, or photophobia. She has follow with neurologist,Dr Penumalli in 2016.  Evaluated for pseudotumor cerebri, LP results were normal and at that time headache improved after discontinuing OCP's Brain MRI done in 10/2014 was normal.  "Random" episodes of nausea since 11/2017 and about 2 times per week. This is not associated to headaches. Usually in the morning.  Negative for abdominal pain,vomiting, changes in bowel habits, blood in stool or melena.   Heartburn when she eats pizza. No other associated symptoms. + Smoker.  Review of Systems  Constitutional: Negative for appetite change, diaphoresis, fatigue and fever.  HENT: Negative for dental problem, hearing loss, mouth sores, trouble swallowing and voice change.   Eyes: Negative for redness and visual disturbance.  Respiratory: Negative for cough, shortness of breath and wheezing.   Cardiovascular:  Positive for palpitations (occasionally). Negative for chest pain and leg swelling.  Gastrointestinal: Positive for nausea. Negative for abdominal pain and vomiting.       No changes in bowel habits.  Endocrine: Negative for cold intolerance, heat intolerance, polydipsia, polyphagia and polyuria.  Genitourinary: Negative for decreased urine volume, dysuria, hematuria, vaginal bleeding and vaginal discharge.  Musculoskeletal: Negative for gait problem and myalgias.  Skin: Negative for color change and rash.  Allergic/Immunologic: Positive for environmental allergies.  Neurological: Positive for light-headedness and headaches. Negative for syncope and weakness.  Hematological: Negative for adenopathy. Does not bruise/bleed easily.  Psychiatric/Behavioral: Positive for sleep disturbance. Negative for confusion. The patient is nervous/anxious.   All other systems reviewed and are negative.     Current Outpatient Medications on File Prior to Visit  Medication Sig Dispense Refill  . levonorgestrel (MIRENA) 20 MCG/24HR IUD 1 each by Intrauterine route once.    Marland Kitchen levothyroxine (SYNTHROID, LEVOTHROID) 112 MCG tablet     . metFORMIN (GLUCOPHAGE-XR) 500 MG 24 hr tablet      No current facility-administered medications on file prior to visit.      Past Medical History:  Diagnosis Date  . ACL tear 05/12/2014   injured on trampoline  . Headache(784.0)    stress  . Polycystic ovarian syndrome    no current med.    Past Surgical History:  Procedure Laterality Date  . KNEE ARTHROSCOPY WITH ANTERIOR CRUCIATE LIGAMENT (ACL) REPAIR Left 06/05/2014   Procedure: LEFT KNEE ARTHROSCOPY WITH ANTERIOR CRUCIATE LIGAMENT (ACL) REPAIR;  Surgeon: Harvie Junior, MD;  Location: [facility];  Service: Orthopedics;  Laterality: Left;  .  WISDOM TOOTH EXTRACTION      No Known Allergies  Family History  Problem Relation Age of Onset  . Cardiomyopathy Mother   . High blood pressure Mother   .  Emphysema Mother   . Ulcers Father   . Cancer Paternal Grandfather     Social History   Socioeconomic History  . Marital status: Married    Spouse name: Not on file  . Number of children: 0  . Years of education: College/RN  . Highest education level: Not on file  Occupational History    Employer: [employer]    Comment: ED-RN  Social Needs  . Financial resource strain: Not on file  . Food insecurity:    Worry: Not on file    Inability: Not on file  . Transportation needs:    Medical: Not on file    Non-medical: Not on file  Tobacco Use  . Smoking status: Current Every Day Smoker    Types: Cigarettes  . Smokeless tobacco: Never Used  Substance and Sexual Activity  . Alcohol use: No    Alcohol/week: 0.0 oz  . Drug use: No  . Sexual activity: Not on file  Lifestyle  . Physical activity:    Days per week: Not on file    Minutes per session: Not on file  . Stress: Not on file  Relationships  . Social connections:    Talks on phone: Not on file    Gets together: Not on file    Attends religious service: Not on file    Active member of club or organization: Not on file    Attends meetings of clubs or organizations: Not on file    Relationship status: Not on file  Other Topics Concern  . Not on file  Social History Narrative   Patient lives at home with family.   Caffeine Use:1-2 cups     Vitals:   04/02/18 1428  BP: 123/77  Pulse: 100  Resp: 12  Temp: 98.1 F (36.7 C)  SpO2: 97%   Body mass index is 39.61 kg/m.   Wt Readings from Last 3 Encounters:  04/02/18 252 lb 6 oz (114.5 kg)  11/10/17 248 lb (112.5 kg)  08/29/17 247 lb (112 kg)    Physical Exam  Nursing note and vitals reviewed. Constitutional: She is oriented to person, place, and time. She appears well-developed. No distress.  HENT:  Head: Normocephalic and atraumatic.  Right Ear: Hearing, tympanic membrane, external ear and ear canal normal.  Left Ear: Hearing, tympanic membrane,  external ear and ear canal normal.  Mouth/Throat: Uvula is midline, oropharynx is clear and moist and mucous membranes are normal.  Eyes: Pupils are equal, round, and reactive to light. Conjunctivae and EOM are normal.  Neck: No tracheal deviation present. Thyromegaly present.  Cardiovascular: Normal rate and regular rhythm.  No murmur heard. Pulses:      Dorsalis pedis pulses are 2+ on the right side, and 2+ on the left side.  Respiratory: Effort normal and breath sounds normal. No respiratory distress.  GI: Soft. She exhibits no mass. There is no hepatomegaly. There is no tenderness.  Musculoskeletal: She exhibits no edema.  No major deformity or signs of synovitis appreciated.  Lymphadenopathy:    She has no cervical adenopathy.       Right: No supraclavicular adenopathy present.       Left: No supraclavicular adenopathy present.  Neurological: She is alert and oriented to person, place, and time. She has normal  strength. No cranial nerve deficit. Coordination and gait normal.  Reflex Scores:      Bicep reflexes are 2+ on the right side and 2+ on the left side.      Patellar reflexes are 2+ on the right side and 2+ on the left side. Skin: Skin is warm. No rash noted. No erythema.  Psychiatric: She has a normal mood and affect. Her speech is normal.  Well groomed, good eye contact.    ASSESSMENT AND PLAN:  Ms. Avari Nevares was here today annual physical examination and with some concens.   Orders Placed This Encounter  Procedures  . Tdap vaccine greater than or equal to 7yo IM  . CBC  . Basic metabolic panel   Lab Results  Component Value Date   CREATININE 0.58 04/02/2018   BUN 11 04/02/2018   NA 139 04/02/2018   K 4.6 04/02/2018   CL 104 04/02/2018   CO2 30 04/02/2018   Lab Results  Component Value Date   WBC 7.9 04/02/2018   HGB 14.4 04/02/2018   HCT 42.2 04/02/2018   MCV 86.0 04/02/2018   PLT 251.0 04/02/2018    Routine general medical examination at a  health care facility  We discussed the importance of regular physical activity and healthy diet for prevention of chronic illness and/or complications. Preventive guidelines reviewed. Vaccination updated. She will continue following with her gynecologist for her female preventive care.  Next CPE in a year.  Nausea without vomiting  We discussed possible etiologies, including dyspepsia, GERD, or gallbladder disease. Further recommendations will be given according to lab results.  -     CBC -     Basic metabolic panel  Headache, unspecified headache type  ?  Tension headache. Other possible causes discussed. Adequate hydration is recommended. If headache is persistent or if it gets worse we might need neurology evaluation.  Gastroesophageal reflux disease, esophagitis presence not specified  She had agrees with starting omeprazole 40 mg daily for 3 to 4 weeks. If nausea and heartburn improved she can continue PPI as needed. GERD precautions are also recommended.   -     omeprazole (PRILOSEC) 40 MG capsule; Take 1 capsule (40 mg total) by mouth daily.  Need for Tdap vaccination -     Tdap vaccine greater than or equal to 7yo IM    Return in 1 year (on 04/03/2019) for routine.      Javone Ybanez G. [place], MD  [facility]. [place] office.

## 2018-04-02 ENCOUNTER — Encounter: Payer: Self-pay | Admitting: Family Medicine

## 2018-04-02 ENCOUNTER — Ambulatory Visit (INDEPENDENT_AMBULATORY_CARE_PROVIDER_SITE_OTHER): Payer: 59 | Admitting: Family Medicine

## 2018-04-02 VITALS — BP 123/77 | HR 100 | Temp 98.1°F | Resp 12 | Ht 66.93 in | Wt 252.4 lb

## 2018-04-02 DIAGNOSIS — Z23 Encounter for immunization: Secondary | ICD-10-CM

## 2018-04-02 DIAGNOSIS — Z0000 Encounter for general adult medical examination without abnormal findings: Secondary | ICD-10-CM

## 2018-04-02 DIAGNOSIS — R51 Headache: Secondary | ICD-10-CM | POA: Diagnosis not present

## 2018-04-02 DIAGNOSIS — K219 Gastro-esophageal reflux disease without esophagitis: Secondary | ICD-10-CM

## 2018-04-02 DIAGNOSIS — R110 Nausea: Secondary | ICD-10-CM

## 2018-04-02 DIAGNOSIS — R519 Headache, unspecified: Secondary | ICD-10-CM

## 2018-04-02 LAB — CBC
HCT: 42.2 % (ref 36.0–46.0)
Hemoglobin: 14.4 g/dL (ref 12.0–15.0)
MCHC: 34.1 g/dL (ref 30.0–36.0)
MCV: 86 fl (ref 78.0–100.0)
Platelets: 251 10*3/uL (ref 150.0–400.0)
RBC: 4.91 Mil/uL (ref 3.87–5.11)
RDW: 13.2 % (ref 11.5–15.5)
WBC: 7.9 10*3/uL (ref 4.0–10.5)

## 2018-04-02 LAB — BASIC METABOLIC PANEL
BUN: 11 mg/dL (ref 6–23)
CO2: 30 mEq/L (ref 19–32)
Calcium: 9.8 mg/dL (ref 8.4–10.5)
Chloride: 104 mEq/L (ref 96–112)
Creatinine, Ser: 0.58 mg/dL (ref 0.40–1.20)
GFR: 135.11 mL/min (ref >60.00)
Glucose, Bld: 95 mg/dL (ref 70–99)
Potassium: 4.6 mEq/L (ref 3.5–5.1)
Sodium: 139 mEq/L (ref 135–145)

## 2018-04-02 MED ORDER — OMEPRAZOLE 40 MG PO CPDR: 40.0000 mg | DELAYED_RELEASE_CAPSULE | Freq: Every day | ORAL | 1 refills | Status: DC

## 2018-04-02 MED FILL — OMEPRAZOLE DR 40 MG CAPSULE: 40 | 30 days supply | Qty: 30 | Fill #0

## 2018-04-02 NOTE — Patient Instructions (Addendum)
A few things to remember from today's visit:   Routine general medical examination at a health care facility  Nausea without vomiting - Plan: CBC, Basic metabolic panel  Headache, unspecified headache type  Gastroesophageal reflux disease, esophagitis presence not specified - Plan: omeprazole (PRILOSEC) 40 MG capsule  Today you have you routine preventive visit.  At least 150 minutes of moderate exercise per week, daily brisk walking for 15-30 min is a good exercise option. Healthy diet low in saturated (animal) fats and sweets and consisting of fresh fruits and vegetables, lean meats such as fish and white chicken and whole grains.  These are some of recommendations for screening depending of age and risk factors:   - Vaccines:  Tdap vaccine every 10 years.Given today.  Shingles vaccine recommended at age 25, could be given after 25 years of age but not sure about insurance coverage.   Pneumonia vaccines:  Prevnar 13 at 65 and Pneumovax at 66. Sometimes Pneumovax is giving earlier if history of smoking, lung disease,diabetes,kidney disease among some.    Screening for diabetes at age 38 and every 3 years.  Cervical cancer prevention:  Pap smear starts at 25 years of age and continues periodically until 25 years old in low risk women. Pap smear every 3 years between 52 and 40 years old. Pap smear every 3-5 years between women 30 and older if pap smear negative and HPV screening negative.   -Breast cancer: Mammogram: There is disagreement between experts about when to start screening in low risk asymptomatic female but recent recommendations are to start screening at 6 and not later than 25 years old , every 1-2 years and after 25 yo q 2 years. Screening is recommended until 25 years old but some women can continue screening depending of healthy issues.   Colon cancer screening: starts at 25 years old until 25 years old.  Cholesterol disorder screening at age 30 and every 3  years.  Also recommended:  1. Dental visit- Brush and floss your teeth twice daily; visit your dentist twice a year. 2. Eye doctor- Get an eye exam at least every 2 years. 3. Helmet use- Always wear a helmet when riding a bicycle, motorcycle, rollerblading or skateboarding. 4. Safe sex- If you may be exposed to sexually transmitted infections, use a condom. 5. Seat belts- Seat belts can save your live; always wear one. 6. Smoke/Carbon Monoxide detectors- These detectors need to be installed on the appropriate level of your home. Replace batteries at least once a year. 7. Skin cancer- When out in the sun please cover up and use sunscreen 15 SPF or higher. 8. Violence- If anyone is threatening or hurting you, please tell your healthcare provider.  9. Drink alcohol in moderation- Limit alcohol intake to one drink or less per day. Never drink and drive.  Headache your report today does not seem worrisome, it could be tension-like headache. Increase fluid intake and monitor for worrisome symptoms.  Try to check blood pressure when having these episodes.  Nausea could be related to inflammation of stomach or duodenum , ?GERD.  Try omeprazole 30 minutes before breakfast for 3 to 4 weeks and if better continue as needed if not we may need to consider gastroenterology evaluation.

## 2018-04-03 ENCOUNTER — Encounter: Payer: Self-pay | Admitting: Family Medicine

## 2018-04-30 ENCOUNTER — Encounter: Payer: Self-pay | Admitting: Family Medicine

## 2018-04-30 ENCOUNTER — Ambulatory Visit: Payer: 59 | Admitting: Family Medicine

## 2018-04-30 VITALS — BP 124/83 | HR 65 | Temp 97.5°F | Resp 12 | Ht 66.93 in | Wt 255.2 lb

## 2018-04-30 DIAGNOSIS — R51 Headache: Secondary | ICD-10-CM

## 2018-04-30 DIAGNOSIS — R519 Headache, unspecified: Secondary | ICD-10-CM

## 2018-04-30 MED ORDER — AMITRIPTYLINE HCL 25 MG PO TABS: 25.0000 mg | ORAL_TABLET | Freq: Every day | ORAL | 1 refills | Status: DC

## 2018-04-30 MED FILL — AMITRIPTYLINE HCL 25 MG TAB: 25 | 30 days supply | Qty: 30 | Fill #0

## 2018-04-30 NOTE — Progress Notes (Signed)
HPI:  Chief Complaint  Patient presents with  . Headache    pressure, frontal or top of head and around eyes, having headaches at least 5 days out the week    Whitney Aguirre is a 27 y.o. female, who is here today with her husband c/o persistent headaches.   She was seen on 04/02/18 for her CPE.  She has had frontal temporo parietal pressure headache for about 3 months, 6/10, several times per week. She has not identified exacerbating or alleviating factors. Sometimes she wakes up with headache.  No associated visual changes,photophobia, nausea,vomiting,or focal deficit. Last eye exam 02/2018.  She takes OTC Ibuprofen.  Hx of migraines headache, has not had episodes in a couple years. It was usually frontal preceded by aure, zig-zag line and throbbing headache.  Mother with Hx of migraines.   Hx of allergic rhinitis,she has not had nasal congestion or rhinorrhea.  No fever or chills.  No more stress then usual.   Review of Systems  Constitutional: Positive for fatigue. Negative for chills, fever and unexpected weight change.  HENT: Negative for nosebleeds, tinnitus and trouble swallowing.   Eyes: Negative for photophobia and visual disturbance.  Respiratory: Negative for shortness of breath and wheezing.   Cardiovascular: Negative for chest pain, palpitations and leg swelling.  Gastrointestinal: Negative for abdominal pain, nausea and vomiting.       No changes in bowel habits.  Musculoskeletal: Negative for gait problem and myalgias.  Skin: Negative for rash.  Allergic/Immunologic: Positive for environmental allergies.  Neurological: Positive for headaches. Negative for syncope and weakness.  Hematological: Negative for adenopathy. Does not bruise/bleed easily.  Psychiatric/Behavioral: Negative for confusion. The patient is not nervous/anxious.       Current Outpatient Medications on File Prior to Visit  Medication Sig Dispense Refill  .  levonorgestrel (MIRENA) 20 MCG/24HR IUD 1 each by Intrauterine route once.    Marland Kitchen levothyroxine (SYNTHROID, LEVOTHROID) 112 MCG tablet     . metFORMIN (GLUCOPHAGE-XR) 500 MG 24 hr tablet     . omeprazole (PRILOSEC) 40 MG capsule Take 1 capsule (40 mg total) by mouth daily. 30 capsule 1   No current facility-administered medications on file prior to visit.      Past Medical History:  Diagnosis Date  . ACL tear 05/12/2014   injured on trampoline  . Headache(784.0)    stress  . Polycystic ovarian syndrome    no current med.   No Known Allergies  Social History   Socioeconomic History  . Marital status: Married    Spouse name: Not on file  . Number of children: 0  . Years of education: College/RN  . Highest education level: Not on file  Occupational History    Employer: [employer]    Comment: ED-RN  Social Needs  . Financial resource strain: Not on file  . Food insecurity:    Worry: Not on file    Inability: Not on file  . Transportation needs:    Medical: Not on file    Non-medical: Not on file  Tobacco Use  . Smoking status: Never Smoker  . Smokeless tobacco: Never Used  Substance and Sexual Activity  . Alcohol use: No    Alcohol/week: 0.0 oz  . Drug use: No  . Sexual activity: Not on file  Lifestyle  . Physical activity:    Days per week: Not on file    Minutes per session: Not on file  . Stress: Not on  file  Relationships  . Social connections:    Talks on phone: Not on file    Gets together: Not on file    Attends religious service: Not on file    Active member of club or organization: Not on file    Attends meetings of clubs or organizations: Not on file    Relationship status: Not on file  Other Topics Concern  . Not on file  Social History Narrative   Patient lives at home with family.   Caffeine Use:1-2 cups    Vitals:   04/30/18 1141  BP: 124/83  Pulse: 65  Resp: 12  Temp: (!) 97.5 F (36.4 C)  SpO2: 99%   Body mass index is 40.06  kg/m.   Physical Exam  Nursing note and vitals reviewed. Constitutional: She is oriented to person, place, and time. She appears well-developed. No distress.  HENT:  Head: Normocephalic and atraumatic.  Nose: Right sinus exhibits no maxillary sinus tenderness and no frontal sinus tenderness. Left sinus exhibits no maxillary sinus tenderness and no frontal sinus tenderness.  Mouth/Throat: Oropharynx is clear and moist and mucous membranes are normal.  Eyes: Pupils are equal, round, and reactive to light. Conjunctivae and EOM are normal.  Cardiovascular: Normal rate and regular rhythm.  No murmur heard. Respiratory: Effort normal and breath sounds normal. No respiratory distress.  GI: Soft. She exhibits no mass. There is no hepatomegaly. There is no tenderness.  Musculoskeletal: She exhibits no edema.  Lymphadenopathy:    She has no cervical adenopathy.  Neurological: She is alert and oriented to person, place, and time. She has normal strength. No cranial nerve deficit. Coordination and gait normal.  Skin: Skin is warm. No rash noted. No erythema.  Psychiatric: She has a normal mood and affect.  Well groomed, good eye contact.    ASSESSMENT AND PLAN:  [name] was seen today for headache.  Diagnoses and all orders for this visit:  Headache, unspecified headache type -     amitriptyline (ELAVIL) 25 MG tablet; Take 1 tablet (25 mg total) by mouth at bedtime.    We discussed possible etiologies, ?tension like headache vs migraine.  She has seen neurologist,Dr Penumalli,because changes in headache type,pressure frontal and retro ocular headache. Evaluated for pseudotumor cerebri. Normal brain MRI 10/2014.  After discussion of treatment options,she agrees with trying Amitriptyline 25 mg at bedtime.Side effects discussed. F/U in 4-6 weeks.    Whitney Aguirre G. [place], MD  [facility]. [place] office.

## 2018-04-30 NOTE — Patient Instructions (Addendum)
A few things to remember from today's visit:   Headache, unspecified headache type - Plan: amitriptyline (ELAVIL) 25 MG tablet   ? Tension headache vs migraine.    Please be sure medication list is accurate. If a new problem present, please set up appointment sooner than planned today.

## 2018-05-02 MED FILL — LEVOTHYROXINE 125 MCG TAB: 125 | 30 days supply | Qty: 60 | Fill #0

## 2018-05-03 MED FILL — METFORMIN HCL ER 500 MG TAB: 500 | 30 days supply | Qty: 60 | Fill #0

## 2018-05-05 ENCOUNTER — Encounter: Payer: Self-pay | Admitting: Family Medicine

## 2018-05-23 DIAGNOSIS — E039 Hypothyroidism, unspecified: Secondary | ICD-10-CM | POA: Diagnosis not present

## 2018-05-23 DIAGNOSIS — E063 Autoimmune thyroiditis: Secondary | ICD-10-CM | POA: Diagnosis not present

## 2018-05-29 DIAGNOSIS — R7301 Impaired fasting glucose: Secondary | ICD-10-CM | POA: Diagnosis not present

## 2018-05-29 DIAGNOSIS — E039 Hypothyroidism, unspecified: Secondary | ICD-10-CM | POA: Diagnosis not present

## 2018-05-29 DIAGNOSIS — E282 Polycystic ovarian syndrome: Secondary | ICD-10-CM | POA: Diagnosis not present

## 2018-05-29 DIAGNOSIS — E669 Obesity, unspecified: Secondary | ICD-10-CM | POA: Diagnosis not present

## 2018-05-29 DIAGNOSIS — E063 Autoimmune thyroiditis: Secondary | ICD-10-CM | POA: Diagnosis not present

## 2018-05-29 MED FILL — LEVOTHYROXINE 137 MCG TABLE: 137 | 30 days supply | Qty: 60 | Fill #0

## 2018-06-19 MED FILL — AMOXICILLIN 500 MG CAPSULE: 500 | 7 days supply | Qty: 21 | Fill #0

## 2018-06-24 MED FILL — IBUPROFEN 600 MG TABLET: 600 | 4 days supply | Qty: 16 | Fill #0

## 2018-06-24 MED FILL — TRAMADOL-ACETAMINOPHN 37.5-: 37.5-325 | 2 days supply | Qty: 16 | Fill #0

## 2018-07-16 DIAGNOSIS — E039 Hypothyroidism, unspecified: Secondary | ICD-10-CM | POA: Diagnosis not present

## 2018-07-16 MED FILL — LEVOTHYROXINE 125 MCG TAB: 125 | 30 days supply | Qty: 60 | Fill #1

## 2018-07-16 MED FILL — METFORMIN HCL ER 500 MG TAB: 500 | 30 days supply | Qty: 60 | Fill #1

## 2018-07-16 MED FILL — AMITRIPTYLINE HCL 25 MG TAB: 25 | 30 days supply | Qty: 30 | Fill #1

## 2018-08-14 ENCOUNTER — Ambulatory Visit: Payer: 59 | Admitting: Family Medicine

## 2018-09-02 ENCOUNTER — Other Ambulatory Visit: Payer: Self-pay | Admitting: Family Medicine

## 2018-09-02 DIAGNOSIS — R519 Headache, unspecified: Secondary | ICD-10-CM

## 2018-09-02 DIAGNOSIS — R51 Headache: Principal | ICD-10-CM

## 2018-09-02 MED FILL — METFORMIN HCL ER 500 MG TAB: 500 | 30 days supply | Qty: 60 | Fill #2

## 2018-09-02 MED FILL — LEVOTHYROXINE 125 MCG TAB: 125 | 30 days supply | Qty: 60 | Fill #2

## 2018-09-04 ENCOUNTER — Encounter: Payer: Self-pay | Admitting: Family Medicine

## 2018-09-04 ENCOUNTER — Ambulatory Visit: Payer: 59 | Admitting: Family Medicine

## 2018-09-04 DIAGNOSIS — R519 Headache, unspecified: Secondary | ICD-10-CM

## 2018-09-04 DIAGNOSIS — R51 Headache: Secondary | ICD-10-CM | POA: Diagnosis not present

## 2018-09-04 MED ORDER — AMITRIPTYLINE HCL 25 MG PO TABS: 25.0000 mg | ORAL_TABLET | Freq: Every day | ORAL | 3 refills | Status: DC

## 2018-09-04 MED FILL — AMITRIPTYLINE HCL 25 MG TAB: 25 | 90 days supply | Qty: 90 | Fill #0

## 2018-09-04 NOTE — Progress Notes (Signed)
HPI:   Whitney Aguirre is a 27 y.o. female, who is here today for follow up.   She was last seen on 04/30/18  Last OV Amitriptyline was started to help with frequent headache, 3-4 times per week. She is reporting great improvement in headache frequency. She is having one headache per month and mild.  No associated nausea,vomiting, or photophobia.  Sleeping about 7 hours.  Denies depressed mood or suicidal thoughts.    She is not exercising regularly and has not been consistent with a healthful diet.  Wt Readings from Last 3 Encounters:  09/04/18 257 lb 8 oz (116.8 kg)  04/30/18 255 lb 4 oz (115.8 kg)  04/02/18 252 lb 6 oz (114.5 kg)   Hx of PCOS, she is on Metformin XR 500 mg daily.  Review of Systems  Constitutional: Negative for activity change, appetite change, fatigue and fever.  HENT: Negative for mouth sores, nosebleeds and trouble swallowing.   Eyes: Negative for redness and visual disturbance.  Respiratory: Negative for shortness of breath and wheezing.   Cardiovascular: Negative for palpitations and leg swelling.  Gastrointestinal: Negative for abdominal pain, nausea and vomiting.       Negative for changes in bowel habits.  Genitourinary: Negative for decreased urine volume, difficulty urinating, dysuria and hematuria.  Neurological: Positive for headaches. Negative for syncope, weakness and numbness.   Current Outpatient Medications on File Prior to Visit  Medication Sig Dispense Refill  . levonorgestrel (MIRENA) 20 MCG/24HR IUD 1 each by Intrauterine route once.    Marland Kitchen levothyroxine (SYNTHROID, LEVOTHROID) 112 MCG tablet     . metFORMIN (GLUCOPHAGE-XR) 500 MG 24 hr tablet     . omeprazole (PRILOSEC) 40 MG capsule Take 1 capsule (40 mg total) by mouth daily. 30 capsule 1   No current facility-administered medications on file prior to visit.      Past Medical History:  Diagnosis Date  . ACL tear 05/12/2014   injured on trampoline  .  Headache(784.0)    stress  . Polycystic ovarian syndrome    no current med.   No Known Allergies  Social History   Socioeconomic History  . Marital status: Married    Spouse name: Not on file  . Number of children: 0  . Years of education: College/RN  . Highest education level: Not on file  Occupational History    Employer: [employer]    Comment: ED-RN  Social Needs  . Financial resource strain: Not on file  . Food insecurity:    Worry: Not on file    Inability: Not on file  . Transportation needs:    Medical: Not on file    Non-medical: Not on file  Tobacco Use  . Smoking status: Never Smoker  . Smokeless tobacco: Never Used  Substance and Sexual Activity  . Alcohol use: No    Alcohol/week: 0.0 standard drinks  . Drug use: No  . Sexual activity: Not on file  Lifestyle  . Physical activity:    Days per week: Not on file    Minutes per session: Not on file  . Stress: Not on file  Relationships  . Social connections:    Talks on phone: Not on file    Gets together: Not on file    Attends religious service: Not on file    Active member of club or organization: Not on file    Attends meetings of clubs or organizations: Not on file    Relationship  status: Not on file  Other Topics Concern  . Not on file  Social History Narrative   Patient lives at home with family.   Caffeine Use:1-2 cups    Vitals:   09/04/18 1630  BP: 124/68  Pulse: 81  Resp: 12  Temp: 97.7 F (36.5 C)  SpO2: 98%   Body mass index is 41.19 kg/m.   Wt Readings from Last 3 Encounters:  09/04/18 257 lb 8 oz (116.8 kg)  04/30/18 255 lb 4 oz (115.8 kg)  04/02/18 252 lb 6 oz (114.5 kg)    Physical Exam  Nursing note and vitals reviewed. Constitutional: She is oriented to person, place, and time. She appears well-developed. No distress.  HENT:  Head: Normocephalic and atraumatic.  Mouth/Throat: Oropharynx is clear and moist and mucous membranes are normal.  Eyes: Pupils are equal,  round, and reactive to light. Conjunctivae are normal.  Cardiovascular: Normal rate and regular rhythm.  No murmur heard. Respiratory: Effort normal and breath sounds normal. No respiratory distress.  GI: Soft. She exhibits no mass. There is no hepatomegaly. There is no tenderness.  Musculoskeletal: She exhibits no edema.  Lymphadenopathy:    She has no cervical adenopathy.  Neurological: She is alert and oriented to person, place, and time. She has normal strength. No cranial nerve deficit. Gait normal.  Skin: Skin is warm. No rash noted. No erythema.  Psychiatric: Her mood appears anxious.  Well groomed, good eye contact.    ASSESSMENT AND PLAN:   [name] was seen today for follow-up.  Orders Placed This Encounter  Procedures  . Amb Ref to Medical Weight Management    Headache, unspecified headache type  Greatly improved. Tolerating medication well. No changes in Amitriptyline 25 mg daily. I think it is appropriate to continue following annually,before if needed.  -     amitriptyline (ELAVIL) 25 MG tablet; Take 1 tablet (25 mg total) by mouth at bedtime.  Severe obesity (BMI >= 40) (HCC)  We discussed benefits of wt loss as well as adverse effects of obesity. Consistency with healthy diet and physical activity recommended.  -     Amb Ref to Medical Weight Management         Dejanique Ruehl G. [place], MD  [facility]. [place] office.

## 2018-09-04 NOTE — Patient Instructions (Signed)
A few things to remember from today's visit:   Headache, unspecified headache type - Plan: amitriptyline (ELAVIL) 25 MG tablet  Severe obesity (BMI >= 40) (HCC) - Plan: Amb Ref to Medical Weight Management   [name], today we have followed on some of your chronic medical problems and they seem to be stable, so no changes in current management today.  Review medication list and be sure it is accurate.  -Remember a healthy diet and regular physical activity are very important for prevention as well as for well being; they also help with many chronic problems, decreasing the need of adding new medications and delaying or preventing possible complications.  I will see you back in 12 months.  Remember to arrange your follow up appt before leaving today.  Please follow sooner than planned if a new concern arises.  Please be sure medication list is accurate. If a new problem present, please set up appointment sooner than planned today.

## 2018-11-05 ENCOUNTER — Encounter: Payer: Self-pay | Admitting: Family Medicine

## 2018-11-24 ENCOUNTER — Ambulatory Visit (INDEPENDENT_AMBULATORY_CARE_PROVIDER_SITE_OTHER): Payer: Self-pay | Admitting: Family Medicine

## 2018-11-24 VITALS — BP 115/75 | HR 77 | Temp 98.2°F | Resp 18 | Wt 262.2 lb

## 2018-11-24 DIAGNOSIS — H6691 Otitis media, unspecified, right ear: Secondary | ICD-10-CM

## 2018-11-24 DIAGNOSIS — R0981 Nasal congestion: Secondary | ICD-10-CM

## 2018-11-24 DIAGNOSIS — J069 Acute upper respiratory infection, unspecified: Secondary | ICD-10-CM

## 2018-11-24 DIAGNOSIS — R6889 Other general symptoms and signs: Secondary | ICD-10-CM

## 2018-11-24 DIAGNOSIS — J029 Acute pharyngitis, unspecified: Secondary | ICD-10-CM

## 2018-11-24 LAB — POCT INFLUENZA A/B
Influenza A, POC: NEGATIVE
Influenza B, POC: NEGATIVE

## 2018-11-24 LAB — POCT RAPID STREP A (OFFICE): Rapid Strep A Screen: NEGATIVE

## 2018-11-24 MED ORDER — AMOXICILLIN-POT CLAVULANATE 875-125 MG PO TABS: 1.0000 | ORAL_TABLET | Freq: Two times a day (BID) | ORAL | 0 refills | Status: DC

## 2018-11-24 MED ORDER — PSEUDOEPH-BROMPHEN-DM 30-2-10 MG/5ML PO SYRP: 10.0000 mL | ORAL_SOLUTION | Freq: Three times a day (TID) | ORAL | 0 refills | Status: DC | PRN

## 2018-11-24 MED ORDER — AZELASTINE HCL 0.1 % NA SOLN: 1.0000 | Freq: Two times a day (BID) | NASAL | 0 refills | Status: DC

## 2018-11-24 NOTE — Patient Instructions (Addendum)
Upper Respiratory Infection, Adult  PLAN< Warm Salt water rinses Cepacol throat lozenges Tylenol -or- Motrin for pain  An upper respiratory infection (URI) affects the nose, throat, and upper air passages. URIs are caused by germs (viruses). The most common type of URI is often called "the common cold." Medicines cannot cure URIs, but you can do things at home to relieve your symptoms. URIs usually get better within 7-10 days. Follow these instructions at home: Activity  Rest as needed.  If you have a fever, stay home from work or school until your fever is gone, or until your doctor says you may return to work or school. ? You should stay home until you cannot spread the infection anymore (you are not contagious). ? Your doctor may have you wear a face mask so you have less risk of spreading the infection. Relieving symptoms  Gargle with a salt-water mixture 3-4 times a day or as needed. To make a salt-water mixture, completely dissolve -1 tsp of salt in 1 cup of warm water.  Use a cool-mist humidifier to add moisture to the air. This can help you breathe more easily. Eating and drinking   Drink enough fluid to keep your pee (urine) pale yellow.  Eat soups and other clear broths. General instructions   Take over-the-counter and prescription medicines only as told by your doctor. These include cold medicines, fever reducers, and cough suppressants.  Do not use any products that contain nicotine or tobacco. These include cigarettes and e-cigarettes. If you need help quitting, ask your doctor.  Avoid being where people are smoking (avoid secondhand smoke).  Make sure you get regular shots and get the flu shot every year.  Keep all follow-up visits as told by your doctor. This is important. How to avoid spreading infection to others   Wash your hands often with soap and water. If you do not have soap and water, use hand sanitizer.  Avoid touching your mouth, face, eyes, or  nose.  Cough or sneeze into a tissue or your sleeve or elbow. Do not cough or sneeze into your hand or into the air. Contact a doctor if:  You are getting worse, not better.  You have any of these: ? A fever. ? Chills. ? Brown or red mucus in your nose. ? Yellow or brown fluid (discharge)coming from your nose. ? Pain in your face, especially when you bend forward. ? Swollen neck glands. ? Pain with swallowing. ? White areas in the back of your throat. Get help right away if:  You have shortness of breath that gets worse.  You have very bad or constant: ? Headache. ? Ear pain. ? Pain in your forehead, behind your eyes, and over your cheekbones (sinus pain). ? Chest pain.  You have long-lasting (chronic) lung disease along with any of these: ? Wheezing. ? Long-lasting cough. ? Coughing up blood. ? A change in your usual mucus.  You have a stiff neck.  You have changes in your: ? Vision. ? Hearing. ? Thinking. ? Mood. Summary  An upper respiratory infection (URI) is caused by a germ called a virus. The most common type of URI is often called "the common cold."  URIs usually get better within 7-10 days.  Take over-the-counter and prescription medicines only as told by your doctor. This information is not intended to replace advice given to you by your health care provider. Make sure you discuss any questions you have with your health care provider. Document Released:  05/01/2008 Document Revised: 07/06/2017 Document Reviewed: 07/06/2017 Elsevier Interactive Patient Education  2019 [employer]. Otitis Media, Adult  Otitis media occurs when there is inflammation and fluid in the middle ear. Your middle ear is a part of the ear that contains bones for hearing as well as air that helps send sounds to your brain. What are the causes? This condition is caused by a blockage in the eustachian tube. This tube drains fluid from the ear to the back of the nose (nasopharynx). A  blockage in this tube can be caused by an object or by swelling (edema) in the tube. Problems that can cause a blockage include:  A cold or other upper respiratory infection.  Allergies.  An irritant, such as tobacco smoke.  Enlarged adenoids. The adenoids are areas of soft tissue located high in the back of the throat, behind the nose and the roof of the mouth.  A mass in the nasopharynx.  Damage to the ear caused by pressure changes (barotrauma). What are the signs or symptoms? Symptoms of this condition include:  Ear pain.  A fever.  Decreased hearing.  A headache.  Tiredness (lethargy).  Fluid leaking from the ear.  Ringing in the ear. How is this diagnosed? This condition is diagnosed with a physical exam. During the exam your health care provider will use an instrument called an otoscope to look into your ear and check for redness, swelling, and fluid. He or she will also ask about your symptoms. Your health care provider may also order tests, such as:  A test to check the movement of the eardrum (pneumatic otoscopy). This test is done by squeezing a small amount of air into the ear.  A test that changes air pressure in the middle ear to check how well the eardrum moves and whether the eustachian tube is working (tympanogram). How is this treated? This condition usually goes away on its own within 3-5 days. But if the condition is caused by a bacteria infection and does not go away own its own, or keeps coming back, your health care provider may:  Prescribe antibiotic medicines to treat the infection.  Prescribe or recommend medicines to control pain. Follow these instructions at home:  Take over-the-counter and prescription medicines only as told by your health care provider.  If you were prescribed an antibiotic medicine, take it as told by your health care provider. Do not stop taking the antibiotic even if you start to feel better.  Keep all follow-up visits  as told by your health care provider. This is important. Contact a health care provider if:  You have bleeding from your nose.  There is a lump on your neck.  You are not getting better in 5 days.  You feel worse instead of better. Get help right away if:  You have severe pain that is not controlled with medicine.  You have swelling, redness, or pain around your ear.  You have stiffness in your neck.  A part of your face is paralyzed.  The bone behind your ear (mastoid) is tender when you touch it.  You develop a severe headache. Summary  Otitis media is redness, soreness, and swelling of the middle ear.  This condition usually goes away on its own within 3-5 days.  If the problem does not go away in 3-5 days, your health care provider may prescribe or recommend medicines to treat your symptoms.  If you were prescribed an antibiotic medicine, take it as told by  your health care provider. This information is not intended to replace advice given to you by your health care provider. Make sure you discuss any questions you have with your health care provider. Document Released: 08/18/2004 Document Revised: 11/03/2016 Document Reviewed: 11/03/2016 Elsevier Interactive Patient Education  2019 [employer]

## 2018-11-24 NOTE — Progress Notes (Signed)
Subjective:   Whitney Aguirre is a 27 y.o. female presenting for chief complaint of Generalized Body Aches (X 2 DAYS); Sore Throat (X 2 DAYS); Chills (X 2 DAYS); and Headache (X 2 DAYS) . Reports 2 day history of subjective fever, sinus congestion, rhinorrhea, sore throat and dry cough, chills, fatigue and malaise. Has tried over the counter cough and cold medications with some relief.Denies history of seasonal allergies, history of asthma. Patient did recieve flu shot this season. Denies smoking. Denies any other aggravating or relieving factors, no other questions or concerns.   Whitney Aguirre has a current medication list which includes the following prescription(s): amitriptyline, levonorgestrel, levothyroxine, metformin, pseudoephedrine, amoxicillin-clavulanate, azelastine, brompheniramine-pseudoephedrine-dm, and omeprazole. Also has No Known Allergies.  Whitney Aguirre  has a past medical history of ACL tear (05/12/2014), Headache(784.0), and Polycystic ovarian syndrome. Also  has a past surgical history that includes Wisdom tooth extraction and Knee arthroscopy with anterior cruciate ligament (acl) repair (Left, 06/05/2014).   Objective:   Vitals: BP 115/75 (BP Location: Right Arm, Patient Position: Sitting, Cuff Size: Large)   Pulse 77   Temp 98.2 F (36.8 C) (Oral)   Resp 18   Wt 262 lb 3.2 oz (118.9 kg)   SpO2 98%   BMI 41.94 kg/m   Physical Exam Vitals signs reviewed.  Constitutional:      Appearance: She is well-developed. She is not toxic-appearing.  HENT:     Head: Normocephalic.     Right Ear: Hearing, ear canal and external ear normal. Tympanic membrane is injected, erythematous and bulging.     Left Ear: Hearing, ear canal and external ear normal. A middle ear effusion is present. Tympanic membrane is bulging.     Nose: Mucosal edema, congestion and rhinorrhea present.     Right Sinus: Maxillary sinus tenderness present. No frontal sinus tenderness.     Left Sinus: Maxillary sinus  tenderness present. No frontal sinus tenderness.     Mouth/Throat:     Pharynx: Uvula midline.  Neck:     Musculoskeletal: Normal range of motion and neck supple.  Cardiovascular:     Rate and Rhythm: Normal rate and regular rhythm.     Pulses: Normal pulses.     Heart sounds: Normal heart sounds.  Pulmonary:     Effort: Pulmonary effort is normal.     Breath sounds: Normal breath sounds. No decreased breath sounds, wheezing, rhonchi or rales.  Abdominal:     General: Bowel sounds are normal.     Palpations: Abdomen is soft.  Musculoskeletal: Normal range of motion.  Lymphadenopathy:     Head:     Right side of head: No submental or submandibular adenopathy.     Left side of head: No submental or submandibular adenopathy.     Cervical: No cervical adenopathy.  Neurological:     Mental Status: She is alert and oriented to person, place, and time.     Results for orders placed or performed in visit on 11/24/18 (from the past 24 hour(s))  POCT Influenza A/B     Status: Normal   Collection Time: 11/24/18  1:06 PM  Result Value Ref Range   Influenza A, POC Negative Negative   Influenza B, POC Negative Negative  POCT rapid strep A     Status: Normal   Collection Time: 11/24/18  1:14 PM  Result Value Ref Range   Rapid Strep A Screen Negative Negative    Assessment and Plan :  1. Right otitis media, unspecified otitis media  type DOC for otitis- medication reviewed with patient to include side effects-  - amoxicillin-clavulanate (AUGMENTIN) 875-125 MG tablet; Take 1 tablet by mouth 2 (two) times daily.  Dispense: 20 tablet; Refill: 0  2. Upper respiratory tract infection, unspecified type Cough and congestion symptoms- medication use and indications relived with patient- used as an adjucnt for symptoms relief to otitis - brompheniramine-pseudoephedrine-DM 30-2-10 MG/5ML syrup; Take 10 mLs by mouth 3 (three) times daily as needed.  Dispense: 120 mL; Refill: 0  3. Flu-like  symptoms Antipyretic for fever, malalise - POCT Influenza A/B- NEGATIVE Results for orders placed or performed in visit on 11/24/18 (from the past 24 hour(s))  POCT Influenza A/B     Status: Normal   Collection Time: 11/24/18  1:06 PM  Result Value Ref Range   Influenza A, POC Negative Negative   Influenza B, POC Negative Negative  POCT rapid strep A     Status: Normal   Collection Time: 11/24/18  1:14 PM  Result Value Ref Range   Rapid Strep A Screen Negative Negative   4. Sore throat Supportive care with warm salt water gargles and throat lozenges - POCT rapid strep A- NEGATIVE  5. Nasal congestion Symptomatic treatment-  - azelastine (ASTELIN) 0.1 % nasal spray; Place 1 spray into both nostrils 2 (two) times daily. Use in each nostril as directed  Dispense: 30 mL; Refill: 0   Exam findings, diagnosis etiology and medication use and indications reviewed with patient. Follow- Up and discharge instructions provided. No emergent/urgent issues found on exam.  Patient verbalized understanding of information provided and agrees with plan of care (POC), all questions answered.  11/24/2018 1:26 PM

## 2018-11-24 NOTE — Progress Notes (Signed)
Oc

## 2018-12-03 DIAGNOSIS — E039 Hypothyroidism, unspecified: Secondary | ICD-10-CM | POA: Diagnosis not present

## 2018-12-03 DIAGNOSIS — E063 Autoimmune thyroiditis: Secondary | ICD-10-CM | POA: Diagnosis not present

## 2018-12-03 DIAGNOSIS — E282 Polycystic ovarian syndrome: Secondary | ICD-10-CM | POA: Diagnosis not present

## 2018-12-03 MED FILL — LEVOTHYROXINE 125 MCG TAB: 125 | 30 days supply | Qty: 60 | Fill #3

## 2018-12-10 ENCOUNTER — Encounter (INDEPENDENT_AMBULATORY_CARE_PROVIDER_SITE_OTHER): Payer: 59

## 2018-12-10 DIAGNOSIS — E063 Autoimmune thyroiditis: Secondary | ICD-10-CM | POA: Diagnosis not present

## 2018-12-10 DIAGNOSIS — E282 Polycystic ovarian syndrome: Secondary | ICD-10-CM | POA: Diagnosis not present

## 2018-12-10 DIAGNOSIS — R7301 Impaired fasting glucose: Secondary | ICD-10-CM | POA: Diagnosis not present

## 2018-12-10 DIAGNOSIS — E669 Obesity, unspecified: Secondary | ICD-10-CM | POA: Diagnosis not present

## 2018-12-10 DIAGNOSIS — E039 Hypothyroidism, unspecified: Secondary | ICD-10-CM | POA: Diagnosis not present

## 2018-12-10 MED FILL — LEVOTHYROXINE 175 MCG TABLE: 175 | 30 days supply | Qty: 30 | Fill #0

## 2018-12-10 MED FILL — LEVOTHYROXINE 300 MCG TAB: 300 | 30 days supply | Qty: 30 | Fill #0

## 2018-12-12 ENCOUNTER — Encounter (INDEPENDENT_AMBULATORY_CARE_PROVIDER_SITE_OTHER): Payer: 59

## 2018-12-16 ENCOUNTER — Ambulatory Visit (INDEPENDENT_AMBULATORY_CARE_PROVIDER_SITE_OTHER): Payer: 59 | Admitting: Bariatrics

## 2018-12-31 ENCOUNTER — Ambulatory Visit (INDEPENDENT_AMBULATORY_CARE_PROVIDER_SITE_OTHER): Payer: 59 | Admitting: Bariatrics

## 2019-01-03 MED FILL — LEVOTHYROXINE 300 MCG TAB: 300 | 30 days supply | Qty: 30 | Fill #1 | Status: TO

## 2019-01-21 DIAGNOSIS — E039 Hypothyroidism, unspecified: Secondary | ICD-10-CM | POA: Diagnosis not present

## 2019-01-28 ENCOUNTER — Ambulatory Visit: Payer: 59 | Admitting: Family Medicine

## 2019-01-28 ENCOUNTER — Encounter: Payer: Self-pay | Admitting: Family Medicine

## 2019-01-28 VITALS — BP 120/74 | HR 65 | Temp 98.0°F | Resp 12 | Ht 66.3 in | Wt 270.2 lb

## 2019-01-28 DIAGNOSIS — G4700 Insomnia, unspecified: Secondary | ICD-10-CM

## 2019-01-28 DIAGNOSIS — R51 Headache: Secondary | ICD-10-CM | POA: Diagnosis not present

## 2019-01-28 DIAGNOSIS — R519 Headache, unspecified: Secondary | ICD-10-CM | POA: Insufficient documentation

## 2019-01-28 DIAGNOSIS — R600 Localized edema: Secondary | ICD-10-CM | POA: Diagnosis not present

## 2019-01-28 LAB — URINALYSIS, ROUTINE W REFLEX MICROSCOPIC
Bilirubin Urine: NEGATIVE
Hgb urine dipstick: NEGATIVE
Ketones, ur: NEGATIVE
Leukocytes,Ua: NEGATIVE
Nitrite: NEGATIVE
RBC / HPF: NONE SEEN (ref 0–?)
Specific Gravity, Urine: 1.02 (ref 1.000–1.030)
Total Protein, Urine: NEGATIVE
Urine Glucose: NEGATIVE
Urobilinogen, UA: 0.2 (ref 0.0–1.0)
pH: 7.5 (ref 5.0–8.0)

## 2019-01-28 LAB — BASIC METABOLIC PANEL
BUN: 13 mg/dL (ref 6–23)
CO2: 29 mEq/L (ref 19–32)
Calcium: 9.1 mg/dL (ref 8.4–10.5)
Chloride: 103 mEq/L (ref 96–112)
Creatinine, Ser: 0.61 mg/dL (ref 0.40–1.20)
GFR: 119.13 mL/min (ref >60.00)
Glucose, Bld: 84 mg/dL (ref 70–99)
Potassium: 4.1 mEq/L (ref 3.5–5.1)
Sodium: 138 mEq/L (ref 135–145)

## 2019-01-28 MED ORDER — TRAZODONE HCL 50 MG PO TABS: 50.0000 mg | ORAL_TABLET | Freq: Every evening | ORAL | 1 refills | Status: DC | PRN

## 2019-01-28 MED FILL — traZODone HCL 50 MG TABS: 50 | 90 days supply | Qty: 90 | Fill #0 | Status: TO

## 2019-01-28 NOTE — Progress Notes (Signed)
ACUTE VISIT   HPI:  Chief Complaint  Patient presents with  . Bilateral swelling in feet    started 1 week ago    Whitney Aguirre is a 28 y.o. female, who is here today with her husband complaining of a week off lower extremity edema. It seems to be worse at the end of the day and better in the morning when she first gets up. She is reporting this as a new problem. She denies associated rash, redness, or pain.  Negative for chest pain, palpitations, or dyspnea. She is not on OCPs, she has IUD. No recent surgery or travel.  She has not noted decreased urine output, gross nocturia,or foamy urine. Denies frequent use of NSAIDs.  She has not used OTC medications. History of hypothyroidism, levothyroxine was adjusted earlier this year.  According to patient, TSH was repeated earlier this month, it was 4.0.  Insomnia: Requesting refills on Trazodone. We have not followed on this problem,medication is not longer on her med list. Trouble falling asleep. States that she is taking Trazodone 50 mg at bedtime,it is helping with sleep.Sleeps about 7-8 hours, she feels rested next day.  Denies side effects.  She is also on Amitriptyline 25 mg daily for headaches. Medications is still helping. Hx of migraine headache with visual aura and throbbing frontal headache.She has not had this type of headache in 2-3 years.  Temporo parietal headache with no associated visual changes,nasuea,or vomiting.  She has about a headache per month and it is mild.  In the past she has followed with neurologist.   Review of Systems  Constitutional: Negative for activity change, fatigue and fever.  Respiratory: Negative for cough, shortness of breath and wheezing.   Cardiovascular: Positive for leg swelling. Negative for chest pain and palpitations.  Gastrointestinal: Negative for abdominal pain, nausea and vomiting.       Negative for changes in bowel habits.  Genitourinary: Negative for  decreased urine volume, dysuria, flank pain and hematuria.  Skin: Negative for rash.  Neurological: Positive for headaches. Negative for weakness and numbness.  Psychiatric/Behavioral: Positive for sleep disturbance. Negative for confusion. The patient is nervous/anxious.     Current Outpatient Medications on File Prior to Visit  Medication Sig Dispense Refill  . amitriptyline (ELAVIL) 25 MG tablet Take 1 tablet (25 mg total) by mouth at bedtime. 90 tablet 3  . azelastine (ASTELIN) 0.1 % nasal spray Place 1 spray into both nostrils 2 (two) times daily. Use in each nostril as directed 30 mL 0  . brompheniramine-pseudoephedrine-DM 30-2-10 MG/5ML syrup Take 10 mLs by mouth 3 (three) times daily as needed. 120 mL 0  . levonorgestrel (MIRENA) 20 MCG/24HR IUD 1 each by Intrauterine route once.    Marland Kitchen levothyroxine (SYNTHROID, LEVOTHROID) 300 MCG tablet     . metFORMIN (GLUCOPHAGE-XR) 500 MG 24 hr tablet     . omeprazole (PRILOSEC) 40 MG capsule Take 1 capsule (40 mg total) by mouth daily. 30 capsule 1  . pseudoephedrine (SUDAFED) 60 MG tablet Take 60 mg by mouth every 4 (four) hours as needed for congestion.     No current facility-administered medications on file prior to visit.      Past Medical History:  Diagnosis Date  . ACL tear 05/12/2014   injured on trampoline  . Headache(784.0)    stress  . Polycystic ovarian syndrome    no current med.   No Known Allergies  Social History   Socioeconomic History  .  Marital status: Married    Spouse name: Not on file  . Number of children: 0  . Years of education: College/RN  . Highest education level: Not on file  Occupational History    Employer: [employer]    Comment: ED-RN  Social Needs  . Financial resource strain: Not on file  . Food insecurity:    Worry: Not on file    Inability: Not on file  . Transportation needs:    Medical: Not on file    Non-medical: Not on file  Tobacco Use  . Smoking status: Never Smoker  .  Smokeless tobacco: Never Used  Substance and Sexual Activity  . Alcohol use: No    Alcohol/week: 0.0 standard drinks  . Drug use: No  . Sexual activity: Not on file  Lifestyle  . Physical activity:    Days per week: Not on file    Minutes per session: Not on file  . Stress: Not on file  Relationships  . Social connections:    Talks on phone: Not on file    Gets together: Not on file    Attends religious service: Not on file    Active member of club or organization: Not on file    Attends meetings of clubs or organizations: Not on file    Relationship status: Not on file  Other Topics Concern  . Not on file  Social History Narrative   Patient lives at home with family.   Caffeine Use:1-2 cups    Vitals:   01/28/19 1200  BP: 120/74  Pulse: 65  Resp: 12  Temp: 98 F (36.7 C)  SpO2: 98%   Body mass index is 43.23 kg/m.   Physical Exam  Nursing note and vitals reviewed. Constitutional: She is oriented to person, place, and time. She appears well-developed. No distress.  HENT:  Head: Normocephalic and atraumatic.  Mouth/Throat: Oropharynx is clear and moist and mucous membranes are normal.  Eyes: Conjunctivae are normal.  Cardiovascular: Normal rate and regular rhythm.  No murmur heard. Pulses:      Dorsalis pedis pulses are 2+ on the right side and 2+ on the left side.  Small peri-ankle varicose veins,atelectasias, bilateral.  Respiratory: Effort normal and breath sounds normal. No respiratory distress.  GI: Soft. She exhibits no mass. There is no hepatomegaly. There is no abdominal tenderness.  Musculoskeletal:        General: No edema.  Neurological: She is alert and oriented to person, place, and time. She has normal strength. No cranial nerve deficit. Gait normal.  Skin: Skin is warm. No rash noted. No erythema.  Psychiatric: Her mood appears anxious.  Well groomed, good eye contact.    ASSESSMENT AND PLAN:  [name] was seen today for bilateral  swelling in feet.  Diagnoses and all orders for this visit:  Bilateral lower extremity edema Possible etiologies discussed. No edema appreciated today.  Reassured about a serious process,Hx and examination today do not suggest any. ? Vein disease. Recommend LE elevation a few times during the afternoon, compression stoking. Instructed about warning signs. Further recommendations according to lab results.  -     Basic metabolic panel -     Urinalysis, Routine w reflex microscopic  Insomnia, unspecified type Well controlled. No changes in current management. Good sleep hygiene.  -     traZODone (DESYREL) 50 MG tablet; Take 1 tablet (50 mg total) by mouth at bedtime as needed for sleep.  Headache, unspecified headache type Migraine headache  and tension like headache. Problem is well controlled with Amitriptyline. Side effects discussed as well as risk of interaction with Trazodone.     Return in about 6 months (around 07/31/2019) for f/u.     Jamelia Varano G. [place], MD  [facility]. [place] office.

## 2019-01-28 NOTE — Patient Instructions (Addendum)
A few things to remember from today's visit:   Bilateral lower extremity edema - Plan: Basic metabolic panel, Urinalysis, Routine w reflex microscopic  Insomnia, unspecified type - Plan: traZODone (DESYREL) 50 MG tablet   Vein disease is a condition that can affect the veins in the legs. It can cause leg pain, varicose veins, swollen legs, or open sores. Varicose veins are swollen and twisted veins. Things that may help: leg exercises (ankle flexion, walking),compression stocking, OTC horse chestnut seed extract 300 mg twice daily, and if any itchy skin cortisone and moisturizers.  Compression stockings- [facility] in [place]  Please be sure medication list is accurate. If a new problem present, please set up appointment sooner than planned today.

## 2019-02-12 MED FILL — AMITRIPTYLINE HCL 25 MG TAB: 25 | 90 days supply | Qty: 90 | Fill #1 | Status: TO

## 2019-03-20 MED FILL — metFORMIN HCL ER 500 MG TB2: 500 | 30 days supply | Qty: 60 | Fill #0

## 2019-03-20 MED FILL — LEVOTHYROXINE 300 MCG TAB: 300 | 30 days supply | Qty: 30 | Fill #0

## 2019-04-25 MED FILL — AMITRIPTYLINE HCL 25 MG TAB: 25 | 90 days supply | Qty: 90 | Fill #0

## 2019-05-08 ENCOUNTER — Encounter: Payer: Self-pay | Admitting: Family Medicine

## 2019-05-13 ENCOUNTER — Ambulatory Visit: Payer: 59 | Admitting: Family Medicine

## 2019-05-13 ENCOUNTER — Other Ambulatory Visit: Payer: Self-pay

## 2019-05-14 ENCOUNTER — Other Ambulatory Visit: Payer: Self-pay

## 2019-05-14 ENCOUNTER — Ambulatory Visit (INDEPENDENT_AMBULATORY_CARE_PROVIDER_SITE_OTHER): Payer: 59 | Admitting: Family Medicine

## 2019-05-14 ENCOUNTER — Encounter: Payer: Self-pay | Admitting: Family Medicine

## 2019-05-14 DIAGNOSIS — R600 Localized edema: Secondary | ICD-10-CM | POA: Diagnosis not present

## 2019-05-14 NOTE — Progress Notes (Signed)
Virtual Visit via Video Note   I connected with Whitney Aguirre on 05/14/19 at  8:00 AM EDT by a video enabled telemedicine application and verified that I am speaking with the correct person using two identifiers.  Location patient: home Location provider:work office Persons participating in the virtual visit: patient, provider  I discussed the limitations of evaluation and management by telemedicine and the availability of in person appointments. The patient expressed understanding and agreed to proceed.   HPI: Whitney Aguirre is a 28 year old female with history of migraines, insomnia, PCOS among some, who is concerned about persistent lower extremity edema, mainly around ankles. I saw her on 01/28/2019, when we addressed this problem. Edema is is stable. It is not present in the morning when she first gets up, she usually notices it at the end of the day. She has not been wearing compression stockings as recommended. She has not tried OTC treatments. Denies high salt intake.  Negative for associated erythema, skin lesions, or pain. No limitation of range of motion. Denies fever, chills, unusual fatigue, arthralgias, chest pain, dyspnea, abdominal pain, nausea, vomiting, decreased urine output, gross hematuria, or foamy urine.  Lab Results  Component Value Date   CREATININE 0.61 01/28/2019   BUN 13 01/28/2019   NA 138 01/28/2019   K 4.1 01/28/2019   CL 103 01/28/2019   CO2 29 01/28/2019    History of hypothyroidism, she follows with endocrinologist. Recently levothyroxine dose was adjusted, increased from 112 to 300 mcg daily.  According to patient, she already has scheduled lab work in 05/2021 following changes.  ROS: See pertinent positives and negatives per HPI.  Past Medical History:  Diagnosis Date  . ACL tear 05/12/2014   injured on trampoline  . Headache(784.0)    stress  . Polycystic ovarian syndrome    no current med.    Past Surgical History:  Procedure Laterality Date  .  KNEE ARTHROSCOPY WITH ANTERIOR CRUCIATE LIGAMENT (ACL) REPAIR Left 06/05/2014   Procedure: LEFT KNEE ARTHROSCOPY WITH ANTERIOR CRUCIATE LIGAMENT (ACL) REPAIR;  Surgeon: Alta Corning, MD;  Location: [facility];  Service: Orthopedics;  Laterality: Left;  . WISDOM TOOTH EXTRACTION      Family History  Problem Relation Age of Onset  . Cardiomyopathy Mother   . High blood pressure Mother   . Emphysema Mother   . Ulcers Father   . Cancer Paternal Grandfather     Social History   Socioeconomic History  . Marital status: Married    Spouse name: Not on file  . Number of children: 0  . Years of education: College/RN  . Highest education level: Not on file  Occupational History    Employer: [employer]    Comment: ED-RN  Social Needs  . Financial resource strain: Not on file  . Food insecurity    Worry: Not on file    Inability: Not on file  . Transportation needs    Medical: Not on file    Non-medical: Not on file  Tobacco Use  . Smoking status: Never Smoker  . Smokeless tobacco: Never Used  Substance and Sexual Activity  . Alcohol use: No    Alcohol/week: 0.0 standard drinks  . Drug use: No  . Sexual activity: Not on file  Lifestyle  . Physical activity    Days per week: Not on file    Minutes per session: Not on file  . Stress: Not on file  Relationships  . Social Herbalist on  phone: Not on file    Gets together: Not on file    Attends religious service: Not on file    Active member of club or organization: Not on file    Attends meetings of clubs or organizations: Not on file    Relationship status: Not on file  . Intimate partner violence    Fear of current or ex partner: Not on file    Emotionally abused: Not on file    Physically abused: Not on file    Forced sexual activity: Not on file  Other Topics Concern  . Not on file  Social History Narrative   Patient lives at home with family.   Caffeine Use:1-2 cups      Current  Outpatient Medications:  .  amitriptyline (ELAVIL) 25 MG tablet, Take 1 tablet (25 mg total) by mouth at bedtime., Disp: 90 tablet, Rfl: 3 .  azelastine (ASTELIN) 0.1 % nasal spray, Place 1 spray into both nostrils 2 (two) times daily. Use in each nostril as directed, Disp: 30 mL, Rfl: 0 .  brompheniramine-pseudoephedrine-DM 30-2-10 MG/5ML syrup, Take 10 mLs by mouth 3 (three) times daily as needed., Disp: 120 mL, Rfl: 0 .  levonorgestrel (MIRENA) 20 MCG/24HR IUD, 1 each by Intrauterine route once., Disp: , Rfl:  .  levothyroxine (SYNTHROID, LEVOTHROID) 300 MCG tablet, , Disp: , Rfl:  .  metFORMIN (GLUCOPHAGE-XR) 500 MG 24 hr tablet, , Disp: , Rfl:  .  omeprazole (PRILOSEC) 40 MG capsule, Take 1 capsule (40 mg total) by mouth daily., Disp: 30 capsule, Rfl: 1 .  pseudoephedrine (SUDAFED) 60 MG tablet, Take 60 mg by mouth every 4 (four) hours as needed for congestion., Disp: , Rfl:  .  traZODone (DESYREL) 50 MG tablet, Take 1 tablet (50 mg total) by mouth at bedtime as needed for sleep., Disp: 90 tablet, Rfl: 1  EXAM:  VITALS per patient if applicable:  GENERAL: alert, oriented, appears well and in no acute distress  HEENT: atraumatic, normocephalic, and conjunctiva clear.  LUNGS: on inspection no signs of respiratory distress, breathing rate appears normal, no obvious gross SOB, gasping or wheezing  CV: no obvious cyanosis  MS: moves all visible extremities without noticeable abnormality  PSYCH/NEURO: pleasant and cooperative, no obvious depression or anxiety, speech and thought processing grossly intact  ASSESSMENT AND PLAN:  Discussed the following assessment and plan:  Bilateral lower extremity edema - Plan: Ambulatory referral to Vascular Surgery, we again reviewed possible etiologies. Last visit in 01/2019, I noted small varicose veins around ankles, mainly telangiectasias.  Peripheral pulses were present. I still think problem is related to vein disease. Strongly recommend trying  compression stockings and/or lower extremity elevation above waist level. Good skin care. We discussed the possibility of trying diuretics, side effect discussed; she prefers to hold on pharmacologic treatment for now. Because no associated symptoms, I do not think further work-up is needed. Among her medications, trazodone could be contributing to problem.  Appointment with vena specialist will be arranged. Instructed about warning signs.    I discussed the assessment and treatment plan with the patient. She was provided an opportunity to ask questions and all were answered. The patient agreed with the plan and demonstrated an understanding of the instructions.    Return if symptoms worsen or fail to improve.    Kaytlynn Kochan [place], MD

## 2019-05-19 ENCOUNTER — Encounter: Payer: Self-pay | Admitting: Family Medicine

## 2019-05-20 ENCOUNTER — Ambulatory Visit: Payer: Self-pay | Admitting: *Deleted

## 2019-05-20 NOTE — Telephone Encounter (Signed)
Pt called with having some rectal bleeding since Friday. Every time she has a bowel movement she has had some bleeding on the stool and in the commode. She denies abdominal cramping or rectal pain. She denies constipation or diarrhea. No nausea or vomiting or feeling weak or light headed. Advised to call back if she any of those symptoms or increase in bleeding. Advised pt of having a virtual appointment with verbal understanding. Will notify LB at [facility] for an appointment. Call transferred to the office. Routed to the practice.  Reason for Disposition . MILD rectal bleeding (more than just a few drops or streaks)  Answer Assessment - Initial Assessment Questions 1. APPEARANCE of BLOOD: "What color is it?" "Is it passed separately, on the surface of the stool, or mixed in with the stool?"      Bright red  2. AMOUNT: "How much blood was passed?"      Commode water was red 3. FREQUENCY: "How many times has blood been passed with the stools?"      With each stool and has had just one today 4. ONSET: "When was the blood first seen in the stools?" (Days or weeks)      Friday 5. DIARRHEA: "Is there also some diarrhea?" If so, ask: "How many diarrhea stools were passed in past 24 hours?"      no 6. CONSTIPATION: "Do you have constipation?" If so, "How bad is it?"     no 7. RECURRENT SYMPTOMS: "Have you had blood in your stools before?" If so, ask: "When was the last time?" and "What happened that time?"      no 8. BLOOD THINNERS: "Do you take any blood thinners?" (e.g., Coumadin/warfarin, Pradaxa/dabigatran, aspirin)     no 9. OTHER SYMPTOMS: "Do you have any other symptoms?"  (e.g., abdominal pain, vomiting, dizziness, fever)    no 10. PREGNANCY: "Is there any chance you are pregnant?" "When was your last menstrual period?"       Not pregnant . She has an IUD  Protocols used: RECTAL BLEEDING-A-AH

## 2019-05-20 NOTE — Telephone Encounter (Signed)
Patient scheduled virtual visit for 05/21/2019.

## 2019-05-21 ENCOUNTER — Encounter: Payer: Self-pay | Admitting: Family Medicine

## 2019-05-21 ENCOUNTER — Ambulatory Visit (INDEPENDENT_AMBULATORY_CARE_PROVIDER_SITE_OTHER): Payer: 59 | Admitting: Family Medicine

## 2019-05-21 ENCOUNTER — Other Ambulatory Visit: Payer: Self-pay

## 2019-05-21 DIAGNOSIS — K625 Hemorrhage of anus and rectum: Secondary | ICD-10-CM

## 2019-05-21 DIAGNOSIS — K649 Unspecified hemorrhoids: Secondary | ICD-10-CM | POA: Diagnosis not present

## 2019-05-21 MED ORDER — HYDROCORTISONE ACETATE 25 MG RE SUPP: 25.0000 mg | Freq: Two times a day (BID) | RECTAL | 0 refills | Status: AC

## 2019-05-21 MED FILL — HYDROCORTISONE ACETATE 25 M: 25 | 7 days supply | Qty: 14 | Fill #0

## 2019-05-21 NOTE — Progress Notes (Signed)
Virtual Visit via Video Note   I connected with [name] on 05/24/19 at  9:45 AM EDT by a video enabled telemedicine application and verified that I am speaking with the correct person using two identifiers.  Location patient: home Location provider:work office Persons participating in the virtual visit: patient, provider  I discussed the limitations of evaluation and management by telemedicine and the availability of in person appointments. The patient expressed understanding and agreed to proceed.   HPI: Whitney Aguirre is a 28 yo female c/o rectal bleeding ,with every bowel movement for 5 days. She sent a picture,red blood in toilet. Denies Hx of rectal bleed or constipation. + Straining a few times before bleeding started.. Diarrhea x 1.  Now stool is "normal." No blood for 1-2 days ,today she saw "a little." She is not sure about exacerbating or alleviating symptoms.  Denies abdominal pain, nausea, vomiting, changes in bowel habits,dyschazia,melena, or skin rash.  She has not noted nose/gum bleed,gross hematuria,or easy bruising. Negative for chills,fever,fatigue,or unusual fatigue. She has not tried OTC treatments.  She donated plasma recently (6 days ago) and Hg was 14.5  ROS: See pertinent positives and negatives per HPI.  Past Medical History:  Diagnosis Date  . ACL tear 05/12/2014   injured on trampoline  . Headache(784.0)    stress  . Polycystic ovarian syndrome    no current med.    Past Surgical History:  Procedure Laterality Date  . KNEE ARTHROSCOPY WITH ANTERIOR CRUCIATE LIGAMENT (ACL) REPAIR Left 06/05/2014   Procedure: LEFT KNEE ARTHROSCOPY WITH ANTERIOR CRUCIATE LIGAMENT (ACL) REPAIR;  Surgeon: Alta Corning, MD;  Location: [place];  Service: Orthopedics;  Laterality: Left;  . WISDOM TOOTH EXTRACTION      Family History  Problem Relation Age of Onset  . Cardiomyopathy Mother   . High blood pressure Mother   . Emphysema Mother   . Ulcers  Father   . Cancer Paternal Grandfather     Social History   Socioeconomic History  . Marital status: Married    Spouse name: Not on file  . Number of children: 0  . Years of education: College/RN  . Highest education level: Not on file  Occupational History    Employer: [employer]    Comment: ED-RN  Social Needs  . Financial resource strain: Not on file  . Food insecurity    Worry: Not on file    Inability: Not on file  . Transportation needs    Medical: Not on file    Non-medical: Not on file  Tobacco Use  . Smoking status: Never Smoker  . Smokeless tobacco: Never Used  Substance and Sexual Activity  . Alcohol use: No    Alcohol/week: 0.0 standard drinks  . Drug use: No  . Sexual activity: Not on file  Lifestyle  . Physical activity    Days per week: Not on file    Minutes per session: Not on file  . Stress: Not on file  Relationships  . Social Herbalist on phone: Not on file    Gets together: Not on file    Attends religious service: Not on file    Active member of club or organization: Not on file    Attends meetings of clubs or organizations: Not on file    Relationship status: Not on file  . Intimate partner violence    Fear of current or ex partner: Not on file    Emotionally abused: Not on  file    Physically abused: Not on file    Forced sexual activity: Not on file  Other Topics Concern  . Not on file  Social History Narrative   Patient lives at home with family.   Caffeine Use:1-2 cups      Current Outpatient Medications:  .  amitriptyline (ELAVIL) 25 MG tablet, Take 1 tablet (25 mg total) by mouth at bedtime., Disp: 90 tablet, Rfl: 3 .  azelastine (ASTELIN) 0.1 % nasal spray, Place 1 spray into both nostrils 2 (two) times daily. Use in each nostril as directed, Disp: 30 mL, Rfl: 0 .  brompheniramine-pseudoephedrine-DM 30-2-10 MG/5ML syrup, Take 10 mLs by mouth 3 (three) times daily as needed., Disp: 120 mL, Rfl: 0 .  hydrocortisone  (ANUSOL-HC) 25 MG suppository, Place 1 suppository (25 mg total) rectally 2 (two) times daily for 7 days., Disp: 14 suppository, Rfl: 0 .  levonorgestrel (MIRENA) 20 MCG/24HR IUD, 1 each by Intrauterine route once., Disp: , Rfl:  .  levothyroxine (SYNTHROID, LEVOTHROID) 300 MCG tablet, , Disp: , Rfl:  .  metFORMIN (GLUCOPHAGE-XR) 500 MG 24 hr tablet, , Disp: , Rfl:  .  omeprazole (PRILOSEC) 40 MG capsule, Take 1 capsule (40 mg total) by mouth daily., Disp: 30 capsule, Rfl: 1 .  pseudoephedrine (SUDAFED) 60 MG tablet, Take 60 mg by mouth every 4 (four) hours as needed for congestion., Disp: , Rfl:  .  traZODone (DESYREL) 50 MG tablet, Take 1 tablet (50 mg total) by mouth at bedtime as needed for sleep., Disp: 90 tablet, Rfl: 1  EXAM:  VITALS per patient if applicable:N/A  GENERAL: alert, oriented, appears well and in no acute distress  HEENT: atraumatic, normocephalic,conjunctiva clear.  LUNGS: on inspection no signs of respiratory distress, breathing rate appears normal, no obvious gross SOB, gasping or wheezing  CV: no obvious cyanosis  PSYCH/NEURO: pleasant and cooperative, no obvious depression or anxiety, speech and thought processing grossly intact  ASSESSMENT AND PLAN:  Discussed the following assessment and plan:  Rectal bleeding - Plan: We discussed possible etiologies. Improving. Hx does not suggest a serious process. Most likely internal hemorrhoids. No symptoms that suggested anemia and recent Hg in normal range,so for now we will hold on blood work.  I do not think further work up is needed at this time.  If problem is recurrent we may need to have stool Cx and/or recto sigmoidoscopy to evaluate for other etiologies.   Bleeding hemorrhoid - Plan: hydrocortisone (ANUSOL-HC) 25 MG suppository, Most likely this is the cause of bleeding. Avoid straining or sitting on commode for more than 10-15 min. Adequate fiber and fluid intake.  Anusol supp bid prn. F/U as  needed,   I discussed the assessment and treatment plan with the patient. She was provided an opportunity to ask questions and all were answered. The patient agreed with the plan and demonstrated an understanding of the instructions.   The patient was advised to call back or seek an in-person evaluation if the symptoms worsen or if the condition fails to improve as anticipated.  Return if symptoms worsen or fail to improve.    Jazzlyn Huizenga [place], MD

## 2019-07-30 ENCOUNTER — Other Ambulatory Visit: Payer: Self-pay

## 2019-07-30 DIAGNOSIS — R600 Localized edema: Secondary | ICD-10-CM

## 2019-08-05 ENCOUNTER — Other Ambulatory Visit: Payer: Self-pay

## 2019-08-05 ENCOUNTER — Ambulatory Visit (HOSPITAL_COMMUNITY)
Admission: RE | Admit: 2019-08-05 | Discharge: 2019-08-05 | Disposition: A | Discharge status: Home / Self Care | Payer: 59 | Source: Ambulatory Visit | Attending: Vascular Surgery | Admitting: Vascular Surgery

## 2019-08-05 ENCOUNTER — Encounter: Payer: Self-pay | Admitting: Vascular Surgery

## 2019-08-05 ENCOUNTER — Ambulatory Visit: Payer: 59 | Admitting: Vascular Surgery

## 2019-08-05 VITALS — BP 122/64 | HR 69 | Temp 97.7°F | Resp 18 | Ht 66.3 in | Wt 276.2 lb

## 2019-08-05 DIAGNOSIS — R600 Localized edema: Secondary | ICD-10-CM | POA: Insufficient documentation

## 2019-08-05 NOTE — Progress Notes (Signed)
[facility]  Patient name: Whitney Aguirre MRN: [id number] DOB: [birth year] Sex: female  REASON FOR CONSULT: Evaluation of bilateral leg swelling  HPI: Whitney Aguirre is a 28 y.o. female, who is here today for evaluation of bilateral leg swelling.  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  She reports discomfort achy sensation with swelling in her lower extremities with prolonged sitting.  She is here today for duplex and evaluation.  She denies any history of DVT.  No history of arterial insufficiency.  Otherwise healthy with non-insulin-dependent diabetes.  She is obese with a BMI of 44  Past Medical History:  Diagnosis Date  . ACL tear 05/12/2014   injured on trampoline  . Diabetes mellitus without complication ([place])   . Headache(784.0)    stress  . Polycystic ovarian syndrome    no current med.  . Thyroid disease     Family History  Problem Relation Age of Onset  . Cardiomyopathy Mother   . High blood pressure Mother   . Emphysema Mother   . Ulcers Father   . Cancer Paternal Grandfather     SOCIAL HISTORY: Social History   Socioeconomic History  . Marital status: Married    Spouse name: Not on file  . Number of children: 0  . Years of education: College/RN  . Highest education level: Not on file  Occupational History    Employer: [employer]    Comment: ED-RN  Social Needs  . Financial resource strain: Not on file  . Food insecurity    Worry: Not on file    Inability: Not on file  . Transportation needs    Medical: Not on file    Non-medical: Not on file  Tobacco Use  . Smoking status: Never Smoker  . Smokeless tobacco: Never Used  Substance and Sexual Activity  . Alcohol use: No    Alcohol/week: 0.0 standard drinks  . Drug use: No  . Sexual activity: Not on file  Lifestyle  . Physical activity    Days per week: Not on file    Minutes per  session: Not on file  . Stress: Not on file  Relationships  . Social Herbalist on phone: Not on file    Gets together: Not on file    Attends religious service: Not on file    Active member of club or organization: Not on file    Attends meetings of clubs or organizations: Not on file    Relationship status: Not on file  . Intimate partner violence    Fear of current or ex partner: Not on file    Emotionally abused: Not on file    Physically abused: Not on file    Forced sexual activity: Not on file  Other Topics Concern  . Not on file  Social History Narrative   Patient lives at home with family.   Caffeine Use:1-2 cups    No Known Allergies  Current Outpatient Medications  Medication Sig Dispense Refill  . amitriptyline (ELAVIL) 25 MG tablet Take 1 tablet (25 mg total) by mouth at bedtime. 90 tablet 3  . levonorgestrel (MIRENA) 20 MCG/24HR IUD 1 each by Intrauterine route once.    Marland Kitchen levothyroxine (SYNTHROID, LEVOTHROID) 300 MCG tablet     . metFORMIN (GLUCOPHAGE-XR) 500 MG 24 hr tablet     . traZODone (DESYREL) 50 MG tablet Take 1  tablet (50 mg total) by mouth at bedtime as needed for sleep. 90 tablet 1  . omeprazole (PRILOSEC) 40 MG capsule Take 1 capsule (40 mg total) by mouth daily. (Patient not taking: Reported on 08/05/2019) 30 capsule 1   No current facility-administered medications for this visit.     REVIEW OF SYSTEMS:  [X]  denotes positive finding, [ ]  denotes negative finding Cardiac  Comments:  Chest pain or chest pressure:    Shortness of breath upon exertion:    Short of breath when lying flat:    Irregular heart rhythm:        Vascular    Pain in calf, thigh, or hip brought on by ambulation:    Pain in feet at night that wakes you up from your sleep:     Blood clot in your veins:    Leg swelling:  x       Pulmonary    Oxygen at home:    Productive cough:     Wheezing:         Neurologic    Sudden weakness in arms or legs:     Sudden  numbness in arms or legs:     Sudden onset of difficulty speaking or slurred speech:    Temporary loss of vision in one eye:     Problems with dizziness:         Gastrointestinal    Blood in stool:  x   Vomited blood:         Genitourinary    Burning when urinating:     Blood in urine:        Psychiatric    Major depression:         Hematologic    Bleeding problems:    Problems with blood clotting too easily:        Skin    Rashes or ulcers:        Constitutional    Fever or chills:      PHYSICAL EXAM: Vitals:   08/05/19 1358  BP: 122/64  Pulse: 69  Resp: 18  Temp: 97.7 F (36.5 C)  SpO2: 97%  Weight: 125.3 kg  Height: 5' 6.3" (1.684 m)    GENERAL: The patient is a well-nourished female, in no acute distress. The vital signs are documented above. CARDIOVASCULAR: 2+ radial and 2+ dorsalis pedis pulses bilaterally.  No evidence of varicosities.` Mild edema currently PULMONARY: There is good air exchange  ABDOMEN: Soft and non-tender  MUSCULOSKELETAL: There are no major deformities or cyanosis. NEUROLOGIC: No focal weakness or paresthesias are detected. SKIN: There are no ulcers or rashes noted. PSYCHIATRIC: The patient has a normal affect.  DATA:  Duplex reveals reflux in her common femoral vein bilaterally.  She does really have reflux throughout her great saphenous vein bilaterally.  There is some enlargement in her saphenous vein  MEDICAL ISSUES: I discussed the significance of her swelling and venous pathology.  Stressed the importance of weight loss if possible.  Also explained portance of keeping her leg elevated above her heart when possible.  She will be fitted for 20 to 30 mmHg graduated compression garments.  She would potentially be a candidate for ablation of her great saphenous vein if she has had failure of conservative treatment.  She will see us again in 3 months for continued discussion.  I explained that Dr. Edilia Boickson or Darrick PennaFields will see her with her  next appointment to discuss potential ablation   Larina Earthlyodd F. Dorthea Maina, MD  FACS [facility] Tel [phone] Pager [phone]

## 2019-08-13 ENCOUNTER — Encounter: Payer: Self-pay | Admitting: Vascular Surgery

## 2019-10-17 ENCOUNTER — Other Ambulatory Visit: Payer: Self-pay

## 2019-10-20 ENCOUNTER — Encounter: Payer: Self-pay | Admitting: Family Medicine

## 2019-10-21 ENCOUNTER — Other Ambulatory Visit: Payer: Self-pay

## 2019-10-21 ENCOUNTER — Encounter: Payer: Self-pay | Admitting: Family Medicine

## 2019-10-21 ENCOUNTER — Telehealth (INDEPENDENT_AMBULATORY_CARE_PROVIDER_SITE_OTHER): Payer: 59 | Admitting: Family Medicine

## 2019-10-21 VITALS — Ht 66.3 in

## 2019-10-21 DIAGNOSIS — R059 Cough, unspecified: Secondary | ICD-10-CM

## 2019-10-21 DIAGNOSIS — R05 Cough: Secondary | ICD-10-CM

## 2019-10-21 DIAGNOSIS — U071 COVID-19: Secondary | ICD-10-CM | POA: Diagnosis not present

## 2019-10-21 MED ORDER — HYDROCODONE-HOMATROPINE 5-1.5 MG/5ML PO SYRP: 5.0000 mL | ORAL_SOLUTION | Freq: Two times a day (BID) | ORAL | 0 refills | Status: AC | PRN

## 2019-10-21 MED ORDER — BENZONATATE 100 MG PO CAPS: 200.0000 mg | ORAL_CAPSULE | Freq: Two times a day (BID) | ORAL | 0 refills | Status: AC | PRN

## 2019-10-21 MED FILL — BENZONATATE 100 MG CAPS: 100 | 10 days supply | Qty: 40 | Fill #0

## 2019-10-21 MED FILL — HYDROCODONE-HOMATROPINE SOL: 5-1.5 | 10 days supply | Qty: 100 | Fill #0

## 2019-10-21 NOTE — Progress Notes (Signed)
Virtual Visit via Video Note   I connected with Whitney Aguirre on 10/21/19 by a video enabled telemedicine application and verified that I am speaking with the correct person using two identifiers.  Location patient: home Location provider:work office Persons participating in the virtual visit: patient, provider  I discussed the limitations of evaluation and management by telemedicine and the availability of in person appointments. The patient expressed understanding and agreed to proceed.   HPI: Whitney Aguirre is a 28 yo female with history of allergy rhinitis, GERD, and obesity who is complaining about persistent cough that is interfering with sleep.  Dx'ed with COVID 19 on 10/16/2019. She was exposed on 10/11/2019, her father-in-law. Her husband, sister and brother-in-law also in contact with same person and symptomatic. Her sister and brother-in-law tested positive for COVID-19. Her husband is being tested today. Her father-in-law is in the ICU.  She has not tried OTC medication.  Body aches, sore throat, and diarrhea have resolved.  Productive cough has been present the whole time and not getting better. She is not able to bring sputum up. Difficulty taking a deep breath because this exacerbates coughing spells. Mild nausea due to coughing spells. Negative for fever, chills, changes in the smell or taste, chest pain, wheezing, dyspnea, abdominal pain, vomiting, or urinary symptoms.  ROS: See pertinent positives and negatives per HPI.  Past Medical History:  Diagnosis Date  . ACL tear 05/12/2014   injured on trampoline  . Diabetes mellitus without complication ([facility])   . Headache(784.0)    stress  . Polycystic ovarian syndrome    no current med.  . Thyroid disease     Past Surgical History:  Procedure Laterality Date  . KNEE ARTHROSCOPY WITH ANTERIOR CRUCIATE LIGAMENT (ACL) REPAIR Left 06/05/2014   Procedure: LEFT KNEE ARTHROSCOPY WITH ANTERIOR CRUCIATE LIGAMENT (ACL) REPAIR;   Surgeon: Harvie Junior, MD;  Location: [facility];  Service: Orthopedics;  Laterality: Left;  . WISDOM TOOTH EXTRACTION      Family History  Problem Relation Age of Onset  . Cardiomyopathy Mother   . High blood pressure Mother   . Emphysema Mother   . Ulcers Father   . Cancer Paternal Grandfather     Social History   Socioeconomic History  . Marital status: Married    Spouse name: Not on file  . Number of children: 0  . Years of education: College/RN  . Highest education level: Not on file  Occupational History    Employer: [employer]    Comment: ED-RN  Social Needs  . Financial resource strain: Not on file  . Food insecurity    Worry: Not on file    Inability: Not on file  . Transportation needs    Medical: Not on file    Non-medical: Not on file  Tobacco Use  . Smoking status: Never Smoker  . Smokeless tobacco: Never Used  Substance and Sexual Activity  . Alcohol use: No    Alcohol/week: 0.0 standard drinks  . Drug use: No  . Sexual activity: Not on file  Lifestyle  . Physical activity    Days per week: Not on file    Minutes per session: Not on file  . Stress: Not on file  Relationships  . Social Musician on phone: Not on file    Gets together: Not on file    Attends religious service: Not on file    Active member of club or organization: Not on file  Attends meetings of clubs or organizations: Not on file    Relationship status: Not on file  . Intimate partner violence    Fear of current or ex partner: Not on file    Emotionally abused: Not on file    Physically abused: Not on file    Forced sexual activity: Not on file  Other Topics Concern  . Not on file  Social History Narrative   Patient lives at home with family.   Caffeine Use:1-2 cups    Current Outpatient Medications:  .  amitriptyline (ELAVIL) 25 MG tablet, Take 1 tablet (25 mg total) by mouth at bedtime., Disp: 90 tablet, Rfl: 3 .  benzonatate (TESSALON)  100 MG capsule, Take 2 capsules (200 mg total) by mouth 2 (two) times daily as needed for up to 10 days., Disp: 40 capsule, Rfl: 0 .  HYDROcodone-homatropine (HYCODAN) 5-1.5 MG/5ML syrup, Take 5 mLs by mouth every 12 (twelve) hours as needed for up to 10 days., Disp: 100 mL, Rfl: 0 .  levonorgestrel (MIRENA) 20 MCG/24HR IUD, 1 each by Intrauterine route once., Disp: , Rfl:  .  levothyroxine (SYNTHROID, LEVOTHROID) 300 MCG tablet, , Disp: , Rfl:  .  metFORMIN (GLUCOPHAGE-XR) 500 MG 24 hr tablet, , Disp: , Rfl:  .  omeprazole (PRILOSEC) 40 MG capsule, Take 1 capsule (40 mg total) by mouth daily. (Patient not taking: Reported on 08/05/2019), Disp: 30 capsule, Rfl: 1 .  traZODone (DESYREL) 50 MG tablet, Take 1 tablet (50 mg total) by mouth at bedtime as needed for sleep., Disp: 90 tablet, Rfl: 1  EXAM:  VITALS per patient if applicable:Ht 5' 6.3" (9.357 m)   BMI 44.18 kg/m   GENERAL: alert, oriented, appears well and in no acute distress  HEENT: atraumatic, conjunctiva clear, no obvious abnormalities on inspection.  NECK: normal movements of the head and neck  LUNGS: on inspection no signs of respiratory distress, breathing rate appears normal, no obvious gross SOB, gasping or wheezing. Episodes of cough during visit.  CV: no obvious cyanosis  PSYCH/NEURO: pleasant and cooperative, no obvious depression or anxiety, speech and thought processing grossly intact  ASSESSMENT AND PLAN:  Discussed the following assessment and plan:  COVID-19 virus infection  Cough - Plan: benzonatate (TESSALON) 100 MG capsule, HYDROcodone-homatropine (HYCODAN) 5-1.5 MG/5ML syrup  We discussed symptoms and possible complications of [id number] infection. Symptoms seem to be improving. Continue symptomatic treatment. Tessalon and Hycodan recommended to help with cough. Side effects discussed.  She is not in respiratory distress at this time, I do not think imaging or further work up is necessary now but she was  clearly instructed about warning signs.    I discussed the assessment and treatment plan with the patient. She was provided an opportunity to ask questions and all were answered.She agreed with the plan and demonstrated an understanding of the instructions.   No follow-ups on file.    Kenza Munar [place], MD

## 2019-10-21 NOTE — Telephone Encounter (Signed)
Seen today. Adea Geisel, MD  

## 2019-12-03 ENCOUNTER — Ambulatory Visit (INDEPENDENT_AMBULATORY_CARE_PROVIDER_SITE_OTHER): Payer: Self-pay | Admitting: Vascular Surgery

## 2019-12-03 ENCOUNTER — Other Ambulatory Visit: Payer: Self-pay

## 2019-12-03 ENCOUNTER — Encounter: Payer: Self-pay | Admitting: Vascular Surgery

## 2019-12-03 VITALS — BP 132/79 | HR 62 | Temp 97.4°F | Resp 18 | Ht 67.0 in | Wt 276.0 lb

## 2019-12-03 DIAGNOSIS — I83813 Varicose veins of bilateral lower extremities with pain: Secondary | ICD-10-CM

## 2019-12-03 NOTE — Progress Notes (Signed)
Patient name: Whitney Aguirre MRN: [id number] DOB: May 07, 1991 Sex: female   HPI: Whitney Aguirre is a 29 y.o. female, originally seen by Dr. Arbie Cookey about 3 months ago for symptomatic varicose veins.  She has pain aching swelling which occurs in both lower extremities as the day progresses.  She works currently for [employer] at [facility] and is in a sitting position most of the day.  She has been wearing compression stockings since seen Dr. Arbie Cookey in September.  These have given her some relief but not completely resolved all of her symptoms.  She does not have prior history of DVT.  She did have Covid a few weeks ago but has now recovered.  She had fairly mild symptoms.  She did not have any hypercoagulable state associated with this.  Past Medical History:  Diagnosis Date  . ACL tear 05/12/2014   injured on trampoline  . Diabetes mellitus without complication (HCC)   . Headache(784.0)    stress  . Polycystic ovarian syndrome    no current med.  . Thyroid disease    Past Surgical History:  Procedure Laterality Date  . KNEE ARTHROSCOPY WITH ANTERIOR CRUCIATE LIGAMENT (ACL) REPAIR Left 06/05/2014   Procedure: LEFT KNEE ARTHROSCOPY WITH ANTERIOR CRUCIATE LIGAMENT (ACL) REPAIR;  Surgeon: Harvie Junior, MD;  Location: [facility];  Service: Orthopedics;  Laterality: Left;  . WISDOM TOOTH EXTRACTION      Family History  Problem Relation Age of Onset  . Cardiomyopathy Mother   . High blood pressure Mother   . Emphysema Mother   . Ulcers Father   . Cancer Paternal Grandfather     SOCIAL HISTORY: Social History   Socioeconomic History  . Marital status: Married    Spouse name: Not on file  . Number of children: 0  . Years of education: College/RN  . Highest education level: Not on file  Occupational History    Employer: [employer]    Comment: ED-RN  Tobacco Use  . Smoking status: Never Smoker  . Smokeless tobacco: Never Used  Substance and Sexual  Activity  . Alcohol use: No    Alcohol/week: 0.0 standard drinks  . Drug use: No  . Sexual activity: Not on file  Other Topics Concern  . Not on file  Social History Narrative   Patient lives at home with family.   Caffeine Use:1-2 cups   Social Determinants of Health   Financial Resource Strain:   . Difficulty of Paying Living Expenses: Not on file  Food Insecurity:   . Worried About Programme researcher, broadcasting/film/video in the Last Year: Not on file  . Ran Out of Food in the Last Year: Not on file  Transportation Needs:   . Lack of Transportation (Medical): Not on file  . Lack of Transportation (Non-Medical): Not on file  Physical Activity:   . Days of Exercise per Week: Not on file  . Minutes of Exercise per Session: Not on file  Stress:   . Feeling of Stress : Not on file  Social Connections:   . Frequency of Communication with Friends and Family: Not on file  . Frequency of Social Gatherings with Friends and Family: Not on file  . Attends Religious Services: Not on file  . Active Member of Clubs or Organizations: Not on file  . Attends Banker Meetings: Not on file  . Marital Status: Not on file  Intimate Partner Violence:   . Fear of  Current or Ex-Partner: Not on file  . Emotionally Abused: Not on file  . Physically Abused: Not on file  . Sexually Abused: Not on file    No Known Allergies  Current Outpatient Medications  Medication Sig Dispense Refill  . amitriptyline (ELAVIL) 25 MG tablet Take 1 tablet (25 mg total) by mouth at bedtime. 90 tablet 3  . levonorgestrel (MIRENA) 20 MCG/24HR IUD 1 each by Intrauterine route once.    Marland Kitchen levothyroxine (SYNTHROID, LEVOTHROID) 300 MCG tablet     . metFORMIN (GLUCOPHAGE-XR) 500 MG 24 hr tablet     . traZODone (DESYREL) 50 MG tablet Take 1 tablet (50 mg total) by mouth at bedtime as needed for sleep. 90 tablet 1  . omeprazole (PRILOSEC) 40 MG capsule Take 1 capsule (40 mg total) by mouth daily. (Patient not taking: Reported on  12/03/2019) 30 capsule 1   No current facility-administered medications for this visit.    ROS:   General:  No weight loss, Fever, chills  HEENT: No recent headaches, no nasal bleeding, no visual changes, no sore throat  Neurologic: No dizziness, blackouts, seizures. No recent symptoms of stroke or mini- stroke. No recent episodes of slurred speech, or temporary blindness.  Cardiac: No recent episodes of chest pain/pressure, no shortness of breath at rest.  No shortness of breath with exertion.  Denies history of atrial fibrillation or irregular heartbeat  Vascular: No history of rest pain in feet.  No history of claudication.  No history of non-healing ulcer, No history of DVT   Pulmonary: No home oxygen, no productive cough, no hemoptysis,  No asthma or wheezing  Musculoskeletal:  [ ]  Arthritis, [ ]  Low back pain,  [ ]  Joint pain  Hematologic:No history of hypercoagulable state.  No history of easy bleeding.  No history of anemia  Gastrointestinal: No hematochezia or melena,  No gastroesophageal reflux, no trouble swallowing  Urinary: [ ]  chronic Kidney disease, [ ]  on HD - [ ]  MWF or [ ]  TTHS, [ ]  Burning with urination, [ ]  Frequent urination, [ ]  Difficulty urinating;   Skin: No rashes  Psychological: No history of anxiety,  No history of depression   Physical Examination  Vitals:   12/03/19 1333  BP: 132/79  Pulse: 62  Resp: 18  Temp: (!) 97.4 F (36.3 C)  TempSrc: Temporal  SpO2: 99%  Weight: 276 lb (125.2 kg)  Height: 5\' 7"  (1.702 m)    Body mass index is 43.23 kg/m.  General:  Alert and oriented, no acute distress HEENT: Normal Neck: No JVD Cardiac: Regular Rate and Rhythm  Skin: No rash, no ulcer no surface varicosities Extremity Pulses:  2+ radial, brachial, femoral, dorsalis pedis, posterior tibial pulses bilaterally Musculoskeletal: No deformity 1+ ankle and pedal edema  Neurologic: Upper and lower extremity motor 5/5 and symmetric  DATA:  I  reviewed the patient's prior venous reflux exam performed in September 2020.  This showed diffuse reflux in the greater saphenous vein with 4 to 5 mm vein diameter  I repeated portions of her ultrasound today with the SonoSite at the bedside.  And confirmed that the greater saphenous vein is 4 to 5 mm in diameter with minimal tortuosity.  ASSESSMENT: Symptomatic venous reflux bilateral lower extremities which has not been completely alleviated with conservative measures of compression therapy alone.   PLAN: We will discuss with the patient's insurance company preapproval for laser ablation of both legs left leg followed by the right in the near future.  Patient will continue to wear her compression stockings.   Ruta Hinds, MD [facility] Office: [phone] Pager: [phone]

## 2020-06-14 ENCOUNTER — Encounter: Payer: Self-pay | Admitting: Family Medicine

## 2020-06-16 ENCOUNTER — Encounter: Payer: Self-pay | Admitting: Family Medicine

## 2020-06-16 ENCOUNTER — Telehealth (INDEPENDENT_AMBULATORY_CARE_PROVIDER_SITE_OTHER): Payer: Self-pay | Admitting: Family Medicine

## 2020-06-16 DIAGNOSIS — R110 Nausea: Secondary | ICD-10-CM

## 2020-06-16 MED ORDER — ONDANSETRON HCL 8 MG PO TABS
8.0000 mg | ORAL_TABLET | Freq: Three times a day (TID) | ORAL | 0 refills | Status: DC | PRN
Start: 2020-06-16 — End: 2021-04-11

## 2020-06-16 MED ORDER — LEVOTHYROXINE SODIUM 300 MCG PO TABS: 300.0000 ug | ORAL_TABLET | Freq: Every day | ORAL | 0 refills | Status: DC

## 2020-06-16 MED ORDER — METFORMIN HCL ER 500 MG PO TB24: 500.0000 mg | ORAL_TABLET | Freq: Every day | ORAL | 0 refills | Status: DC

## 2020-06-16 NOTE — Progress Notes (Signed)
Subjective:    Patient ID: Whitney Aguirre, female    DOB: 1991-04-14, 29 y.o.   MRN: [id number]  HPI Virtual Visit via Video Note  I connected with the patient on 06/16/20 at  2:00 PM EDT by a video enabled telemedicine application and verified that I am speaking with the correct person using two identifiers.  Location patient: home Location provider:work or home office Persons participating in the virtual visit: patient, provider  I discussed the limitations of evaluation and management by telemedicine and the availability of in person appointments. The patient expressed understanding and agreed to proceed.   HPI: Here for chronic nausea. This started about a year ago, and so far no etiology has been found. She has frequent nausea, which does not make her vomit (however she sometimes makes herself vomit in order to feel better). No heartburn or indigestion, no abdominal pain, no fever. Eating food has no effect on the nausea. It often lasts all day long. Her BMs are regular. Last year she had a virtual visit with Dr. Swaziland, who had her try Prilosec. She took this for about one month but it never helped, so she stopped it. She has had an IUD in place for 2 and 1/2 years. She takes no other medication. She ran out of Amitriptyline and Synthroid and Metformin last year.    ROS: See pertinent positives and negatives per HPI.  Past Medical History:  Diagnosis Date  . ACL tear 05/12/2014   injured on trampoline  . Diabetes mellitus without complication (HCC)   . Headache(784.0)    stress  . Polycystic ovarian syndrome    no current med.  . Thyroid disease     Past Surgical History:  Procedure Laterality Date  . KNEE ARTHROSCOPY WITH ANTERIOR CRUCIATE LIGAMENT (ACL) REPAIR Left 06/05/2014   Procedure: LEFT KNEE ARTHROSCOPY WITH ANTERIOR CRUCIATE LIGAMENT (ACL) REPAIR;  Surgeon: Harvie Junior, MD;  Location: [facility];  Service: Orthopedics;  Laterality: Left;  .  WISDOM TOOTH EXTRACTION      Family History  Problem Relation Age of Onset  . Cardiomyopathy Mother   . High blood pressure Mother   . Emphysema Mother   . Ulcers Father   . Cancer Paternal Grandfather      Current Outpatient Medications:  .  amitriptyline (ELAVIL) 25 MG tablet, Take 1 tablet (25 mg total) by mouth at bedtime., Disp: 90 tablet, Rfl: 3 .  levonorgestrel (MIRENA) 20 MCG/24HR IUD, 1 each by Intrauterine route once., Disp: , Rfl:  .  levothyroxine (SYNTHROID) 300 MCG tablet, Take 1 tablet (300 mcg total) by mouth daily before breakfast., Disp: 30 tablet, Rfl: 0 .  metFORMIN (GLUCOPHAGE-XR) 500 MG 24 hr tablet, Take 1 tablet (500 mg total) by mouth daily with breakfast., Disp: 30 tablet, Rfl: 0 .  omeprazole (PRILOSEC) 40 MG capsule, Take 1 capsule (40 mg total) by mouth daily. (Patient not taking: Reported on 12/03/2019), Disp: 30 capsule, Rfl: 1 .  ondansetron (ZOFRAN) 8 MG tablet, Take 1 tablet (8 mg total) by mouth every 8 (eight) hours as needed for nausea., Disp: 60 tablet, Rfl: 0 .  traZODone (DESYREL) 50 MG tablet, Take 1 tablet (50 mg total) by mouth at bedtime as needed for sleep., Disp: 90 tablet, Rfl: 1  EXAM:  VITALS per patient if applicable:  GENERAL: alert, oriented, appears well and in no acute distress  HEENT: atraumatic, conjunttiva clear, no obvious abnormalities on inspection of external nose and ears  NECK: normal movements of the head and neck  LUNGS: on inspection no signs of respiratory distress, breathing rate appears normal, no obvious gross SOB, gasping or wheezing  CV: no obvious cyanosis  MS: moves all visible extremities without noticeable abnormality  PSYCH/NEURO: pleasant and cooperative, no obvious depression or anxiety, speech and thought processing grossly intact  ASSESSMENT AND PLAN: Chronic nausea. We will send her in some Zofran to help with this. I sent in a 30 day supply of Metformin and Synthroid as well. She is going to  make an in person OV soon with Dr. Swaziland to discuss this further.  Gershon Crane, MD  Discussed the following assessment and plan:  No diagnosis found.     I discussed the assessment and treatment plan with the patient. The patient was provided an opportunity to ask questions and all were answered. The patient agreed with the plan and demonstrated an understanding of the instructions.   The patient was advised to call back or seek an in-person evaluation if the symptoms worsen or if the condition fails to improve as anticipated.     Review of Systems     Objective:   Physical Exam        Assessment & Plan:

## 2020-06-17 MED FILL — ONDANSETRON HCL 8 MG TABLET: 8 | 20 days supply | Qty: 60 | Fill #0

## 2020-06-17 MED FILL — METFORMIN HCL ER 500 MG TB2: 500 | 30 days supply | Qty: 30 | Fill #0

## 2020-06-17 MED FILL — LEVOTHYROXINE 300 MCG TAB: 300 | 30 days supply | Qty: 30 | Fill #0

## 2020-06-28 ENCOUNTER — Ambulatory Visit: Payer: BC Managed Care – PPO | Admitting: Family Medicine

## 2020-06-28 ENCOUNTER — Other Ambulatory Visit: Payer: Self-pay

## 2020-06-28 ENCOUNTER — Encounter: Payer: Self-pay | Admitting: Family Medicine

## 2020-06-28 VITALS — BP 110/60 | HR 100 | Resp 16 | Ht 67.0 in | Wt 271.2 lb

## 2020-06-28 DIAGNOSIS — R110 Nausea: Secondary | ICD-10-CM | POA: Diagnosis not present

## 2020-06-28 DIAGNOSIS — R519 Headache, unspecified: Secondary | ICD-10-CM | POA: Diagnosis not present

## 2020-06-28 DIAGNOSIS — K219 Gastro-esophageal reflux disease without esophagitis: Secondary | ICD-10-CM

## 2020-06-28 MED ORDER — AMITRIPTYLINE HCL 25 MG PO TABS: 25.0000 mg | ORAL_TABLET | Freq: Every day | ORAL | 2 refills | Status: DC

## 2020-06-28 NOTE — Assessment & Plan Note (Signed)
This problem could be contributing factor nausea. She agrees with this treatment omeprazole 40 mg daily for 3 to 4 weeks. GERD precautions also recommended.

## 2020-06-28 NOTE — Patient Instructions (Signed)
A few things to remember from today's visit:   Chronic nausea - Plan: US Abdomen Limited RUQ  Gastroesophageal reflux disease without esophagitis  Resume Omeprazole and take it for 3-4 weeks. If not better GI referral can be placed.  Continue Zofran.  If you need refills please call your pharmacy. Do not use My Chart to request refills or for acute issues that need immediate attention.    Please be sure medication list is accurate. If a new problem present, please set up appointment sooner than planned today.

## 2020-06-28 NOTE — Progress Notes (Signed)
Chief Complaint  Patient presents with  . Nausea   HPI: Whitney Aguirre is a 29 y.o. female, who is here today with above complaint. He was recently evaluated for same problem, 06/16/2020. Zofran 8 mg has helped. Nausea has been going on for about 11 months. She has not identified exacerbating or alleviating factors. It seems to be more frequent in the morning. She has missed 2 days of work due to nausea.  No associated abdominal pain, vomiting, or changes in bowel habits.  History of GERD, she is no longer on PPI. Occasional heartburn, exacerbated by spicy food and pizza.  Nausea does not happen at the same time she has heartburn.  Headaches: She ran out of amitriptyline, so headaches are back. She has history of migraine headaches, she has not had episodes in years. She has frequent temporoparietal pressure headaches with no associated symptoms. A few times per week,6/10. She has not identified exacerbating or alleviating factors.  In the past she has followed with neurologist.  Review of Systems  Constitutional: Negative for activity change, appetite change and fever.  HENT: Negative for mouth sores, nosebleeds and sore throat.   Eyes: Negative for redness and visual disturbance.  Respiratory: Negative for cough, shortness of breath and wheezing.   Cardiovascular: Negative for chest pain, palpitations and leg swelling.  Genitourinary: Negative for decreased urine volume and hematuria.  Allergic/Immunologic: Positive for environmental allergies.  Neurological: Positive for dizziness (intermittent but not associated nausea.). Negative for syncope, facial asymmetry and weakness.  Rest see pertinent positives and negatives per HPI.  Current Outpatient Medications on File Prior to Visit  Medication Sig Dispense Refill  . levonorgestrel (MIRENA) 20 MCG/24HR IUD 1 each by Intrauterine route once.    Marland Kitchen levothyroxine (SYNTHROID) 300 MCG tablet Take 1 tablet (300 mcg  total) by mouth daily before breakfast. 30 tablet 0  . metFORMIN (GLUCOPHAGE-XR) 500 MG 24 hr tablet Take 1 tablet (500 mg total) by mouth daily with breakfast. 30 tablet 0  . omeprazole (PRILOSEC) 40 MG capsule Take 1 capsule (40 mg total) by mouth daily. (Patient not taking: Reported on 12/03/2019) 30 capsule 1  . ondansetron (ZOFRAN) 8 MG tablet Take 1 tablet (8 mg total) by mouth every 8 (eight) hours as needed for nausea. 60 tablet 0   No current facility-administered medications on file prior to visit.   Past Medical History:  Diagnosis Date  . ACL tear 05/12/2014   injured on trampoline  . Diabetes mellitus without complication (HCC)   . Headache(784.0)    stress  . Polycystic ovarian syndrome    no current med.  . Thyroid disease    No Known Allergies  Social History   Socioeconomic History  . Marital status: Married    Spouse name: Not on file  . Number of children: 0  . Years of education: College/RN  . Highest education level: Not on file  Occupational History    Employer: [employer]    Comment: ED-RN  Tobacco Use  . Smoking status: Never Smoker  . Smokeless tobacco: Never Used  Vaping Use  . Vaping Use: Never used  Substance and Sexual Activity  . Alcohol use: No    Alcohol/week: 0.0 standard drinks  . Drug use: No  . Sexual activity: Not on file  Other Topics Concern  . Not on file  Social History Narrative   Patient lives at home with family.   Caffeine Use:1-2 cups   Social Determinants of Health  Financial Resource Strain:   . Difficulty of Paying Living Expenses:   Food Insecurity:   . Worried About Programme researcher, broadcasting/film/video in the Last Year:   . Barista in the Last Year:   Transportation Needs:   . Freight forwarder (Medical):   Marland Kitchen Lack of Transportation (Non-Medical):   Physical Activity:   . Days of Exercise per Week:   . Minutes of Exercise per Session:   Stress:   . Feeling of Stress :   Social Connections:   . Frequency of  Communication with Friends and Family:   . Frequency of Social Gatherings with Friends and Family:   . Attends Religious Services:   . Active Member of Clubs or Organizations:   . Attends Banker Meetings:   Marland Kitchen Marital Status:     Vitals:   06/28/20 1630  BP: 110/60  Pulse: 100  Resp: 16  SpO2: 92%   Wt Readings from Last 3 Encounters:  06/28/20 271 lb 4 oz (123 kg)  12/03/19 276 lb (125.2 kg)  08/05/19 276 lb 3.2 oz (125.3 kg)    Body mass index is 42.48 kg/m.  Physical Exam Vitals and nursing note reviewed.  Constitutional:      General: She is not in acute distress.    Appearance: She is well-developed. She is not ill-appearing.  HENT:     Head: Normocephalic and atraumatic.  Eyes:     General: No scleral icterus.    Conjunctiva/sclera: Conjunctivae normal.  Cardiovascular:     Rate and Rhythm: Normal rate and regular rhythm.     Heart sounds: No murmur heard.   Pulmonary:     Effort: Pulmonary effort is normal. No respiratory distress.     Breath sounds: Normal breath sounds.  Abdominal:     General: Bowel sounds are normal. There is no distension.     Palpations: Abdomen is soft. There is no hepatomegaly or mass.     Tenderness: There is no abdominal tenderness.  Musculoskeletal:     Right lower leg: No edema.     Left lower leg: No edema.  Lymphadenopathy:     Cervical: No cervical adenopathy.     Upper Body:     Right upper body: No supraclavicular adenopathy.     Left upper body: No supraclavicular adenopathy.  Skin:    General: Skin is warm.     Findings: No erythema or rash.  Neurological:     Mental Status: She is alert and oriented to person, place, and time.  Psychiatric:     Comments: Well groomed, good eye contact.    ASSESSMENT AND PLAN:  Whitney Aguirre was seen today for nausea.  Diagnoses and all orders for this visit:  Orders Placed This Encounter  Procedures  . US Abdomen Limited RUQ    GERD (gastroesophageal reflux  disease) This problem could be contributing factor nausea. She agrees with this treatment omeprazole 40 mg daily for 3 to 4 weeks. GERD precautions also recommended.  Chronic nausea We discussed possible etiologies. Metformin could be a contributing factor. Continue Zofran 8 mg 3 times per day as needed. Gallbladder disease also to be considered, even though it is not the typical presentation, RUQ [place] will be arranged.  Headache, unspecified headache type Problem was well controlled with amitriptyline 25 mg daily, so resume treatment.  Morbid obesity (HCC) She has gained some wt since 08/2018. 09/04/18 Wt 257 Lb We discussed benefits of wt loss as well  as adverse effects of obesity. Consistency with healthy diet and physical activity recommended.   Return for labs this week.   Keandria Berrocal G. Sw[place]iland, MD  [facility]. [place] office.  Discharge Instructions    A few things to remember from today's visit:   Chronic nausea - Plan: US Abdomen Limited RUQ  Gastroesophageal reflux disease without esophagitis  Resume Omeprazole and take it for 3-4 weeks. If not better GI referral can be placed.  Continue Zofran.  If you need refills please call your pharmacy. Do not use My Chart to request refills or for acute issues that need immediate attention.    Please be sure medication list is accurate. If a new problem present, please set up appointment sooner than planned today.

## 2020-06-28 NOTE — Assessment & Plan Note (Signed)
We discussed possible etiologies. Metformin could be a contributing factor. Continue Zofran 8 mg 3 times per day as needed. Gallbladder disease also to be considered, even though it is not the typical presentation, RUQ [place] will be arranged.

## 2020-06-28 NOTE — Assessment & Plan Note (Addendum)
Problem was well controlled with amitriptyline 25 mg daily, so resume treatment.

## 2020-07-02 ENCOUNTER — Other Ambulatory Visit: Payer: BC Managed Care – PPO

## 2020-07-02 ENCOUNTER — Other Ambulatory Visit: Payer: Self-pay

## 2020-07-02 ENCOUNTER — Encounter: Payer: Self-pay | Admitting: Family Medicine

## 2020-07-02 DIAGNOSIS — R110 Nausea: Secondary | ICD-10-CM | POA: Diagnosis not present

## 2020-07-02 MED FILL — AMITRIPTYLINE HCL 25 MG TAB: 25 | 15 days supply | Qty: 15 | Fill #0

## 2020-07-02 MED FILL — LEVOTHYROXINE 300 MCG TAB: 300 | 10 days supply | Qty: 10 | Fill #0

## 2020-07-02 NOTE — Addendum Note (Signed)
Addended by: Kathreen Devoid on: 07/02/2020 08:48 AM   Modules accepted: Orders

## 2020-07-03 LAB — CBC WITH DIFFERENTIAL/PLATELET
Absolute Monocytes: 616 cells/uL (ref 200–950)
Basophils Absolute: 31 cells/uL (ref 0–200)
Basophils Relative: 0.4 %
Eosinophils Absolute: 257 cells/uL (ref 15–500)
Eosinophils Relative: 3.3 %
HCT: 43.9 % (ref 35.0–45.0)
Hemoglobin: 14.1 g/dL (ref 11.7–15.5)
Lymphs Abs: 2613 cells/uL (ref 850–3900)
MCH: 28.9 pg (ref 27.0–33.0)
MCHC: 32.1 g/dL (ref 32.0–36.0)
MCV: 90 fL (ref 80.0–100.0)
MPV: 12.5 fL (ref 7.5–12.5)
Monocytes Relative: 7.9 %
Neutro Abs: 4282 cells/uL (ref 1500–7800)
Neutrophils Relative %: 54.9 %
Platelets: 234 10*3/uL (ref 140–400)
RBC: 4.88 10*6/uL (ref 3.80–5.10)
RDW: 13.3 % (ref 11.0–15.0)
Total Lymphocyte: 33.5 %
WBC: 7.8 10*3/uL (ref 3.8–10.8)

## 2020-07-03 LAB — COMPREHENSIVE METABOLIC PANEL
AG Ratio: 1.6 (calc) (ref 1.0–2.5)
ALT: 25 U/L (ref 6–29)
AST: 17 U/L (ref 10–30)
Albumin: 4.4 g/dL (ref 3.6–5.1)
Alkaline phosphatase (APISO): 77 U/L (ref 31–125)
BUN: 14 mg/dL (ref 7–25)
CO2: 26 mmol/L (ref 20–32)
Calcium: 9.7 mg/dL (ref 8.6–10.2)
Chloride: 107 mmol/L (ref 98–110)
Creat: 0.89 mg/dL (ref 0.50–1.10)
Globulin: 2.7 g/dL (calc) (ref 1.9–3.7)
Glucose, Bld: 89 mg/dL (ref 65–99)
Potassium: 4.3 mmol/L (ref 3.5–5.3)
Sodium: 142 mmol/L (ref 135–146)
Total Bilirubin: 0.2 mg/dL (ref 0.2–1.2)
Total Protein: 7.1 g/dL (ref 6.1–8.1)

## 2020-07-08 ENCOUNTER — Ambulatory Visit
Admission: RE | Admit: 2020-07-08 | Discharge: 2020-07-08 | Disposition: A | Discharge status: Home / Self Care | Payer: BC Managed Care – PPO | Source: Ambulatory Visit | Attending: Family Medicine | Admitting: Family Medicine

## 2020-07-08 DIAGNOSIS — R110 Nausea: Secondary | ICD-10-CM

## 2020-07-08 DIAGNOSIS — K760 Fatty (change of) liver, not elsewhere classified: Secondary | ICD-10-CM | POA: Diagnosis not present

## 2020-07-12 ENCOUNTER — Encounter: Payer: Self-pay | Admitting: Family Medicine

## 2020-08-09 ENCOUNTER — Encounter: Payer: Self-pay | Admitting: Family Medicine

## 2020-10-18 ENCOUNTER — Encounter: Payer: Self-pay | Admitting: Family Medicine

## 2020-10-20 ENCOUNTER — Encounter: Payer: Self-pay | Admitting: Family Medicine

## 2020-10-20 ENCOUNTER — Other Ambulatory Visit: Payer: Self-pay

## 2020-10-20 ENCOUNTER — Ambulatory Visit: Payer: BC Managed Care – PPO | Admitting: Family Medicine

## 2020-10-20 VITALS — BP 124/70 | HR 69 | Resp 16 | Ht 67.0 in | Wt 277.2 lb

## 2020-10-20 DIAGNOSIS — E039 Hypothyroidism, unspecified: Secondary | ICD-10-CM | POA: Diagnosis not present

## 2020-10-20 DIAGNOSIS — R519 Headache, unspecified: Secondary | ICD-10-CM | POA: Diagnosis not present

## 2020-10-20 DIAGNOSIS — R040 Epistaxis: Secondary | ICD-10-CM | POA: Diagnosis not present

## 2020-10-20 DIAGNOSIS — G4700 Insomnia, unspecified: Secondary | ICD-10-CM | POA: Diagnosis not present

## 2020-10-20 NOTE — Patient Instructions (Addendum)
A few things to remember from today's visit:  Anterior epistaxis  Headache, unspecified headache type  Insomnia, unspecified type  Resume Amitriptyline 25 mg 30-45 min before bedtime. Good hygiene. Melatonin 2 hours before bedtime may help.  Small amount of [place] on nose/septum may help to prevent bleeding. Dry hot environment and rhinitis may be aggravating problem.  Nosebleed, Adult A nosebleed is when blood comes out of the nose. Nosebleeds are common. Usually, they are not a sign of a serious condition. Nosebleeds can happen if a small blood vessel in your nose starts to bleed or if the lining of your nose (mucous membrane) cracks. They are commonly caused by:  Allergies.  Colds.  Picking your nose.  Blowing your nose too hard.  An injury from sticking an object into your nose or getting hit in the nose.  Dry or cold air. Less common causes of nosebleeds include:  Toxic fumes.  Something abnormal in the nose or in the air-filled spaces in the bones of the face (sinuses).  Growths in the nose, such as polyps.  Medicines or conditions that cause blood to clot slowly.  Certain illnesses or procedures that irritate or dry out the nasal passages. Follow these instructions at home: When you have a nosebleed:   Sit down and tilt your head slightly forward.  Use a clean towel or tissue to pinch your nostrils under the bony part of your nose. After 10 minutes, let go of your nose and see if bleeding starts again. Do not release pressure before that time. If there is still bleeding, repeat the pinching and holding for 10 minutes until the bleeding stops.  Do not place tissues or gauze in the nose to stop bleeding.  Avoid lying down and avoid tilting your head backward. That may make blood collect in the throat and cause gagging or coughing.  Use a nasal spray decongestant to help with a nosebleed as told by your health care provider.  Do not use petroleum jelly or mineral  oil in your nose. It can drip into your lungs. After a nosebleed:  Avoid blowing your nose or sniffing for a number of hours.  Avoid straining, lifting, or bending at the waist for several days. You may resume other normal activities as you are able.  Use saline spray or a humidifier as told by your health care provider.  Aspirinand blood thinners make bleeding more likely. If you are prescribed these medicines and you suffer from nosebleeds: ? Ask your health care provider if you should stop taking the medicines or if you should adjust the dose. ? Do not stop taking medicines that your health care provider has recommended unless told by your health care provider.  If your nosebleed was caused by dry mucous membranes, use over-the-counter saline nasal spray or gel. This will keep the mucous membranes moist and allow them to heal. If you must use a lubricant: ? Choose one that is water-soluble. ? Use only as much as you need and use it only as often as needed. ? Do not lie down until several hours after you use it. Contact a health care provider if:  You have a fever.  You get nosebleeds often or more often than usual.  You bruise very easily.  You have a nosebleed from having something stuck in your nose.  You have bleeding in your mouth.  You vomit or cough up brown material.  You have a nosebleed after you start a new medicine. Get  help right away if:  You have a nosebleed after a fall or a head injury.  Your nosebleed does not go away after 20 minutes.  You feel dizzy or weak.  You have unusual bleeding from other parts of your body.  You have unusual bruising on other parts of your body.  You become sweaty.  You vomit blood. This information is not intended to replace advice given to you by your health care provider. Make sure you discuss any questions you have with your health care provider. Document Revised: 02/12/2018 Document Reviewed: 05/30/2016 Elsevier  Patient Education  [employer].  If you need refills please call your pharmacy. Do not use My Chart to request refills or for acute issues that need immediate attention.    Please be sure medication list is accurate. If a new problem present, please set up appointment sooner than planned today.

## 2020-10-20 NOTE — Assessment & Plan Note (Signed)
Tension like headache. Other possible etiologies discussed. Problem is not well controlled. Amitriptyline 25 mg was helping,so she will resume.

## 2020-10-20 NOTE — Progress Notes (Signed)
Chief Complaint  Patient presents with  . nose bleeds   HPI: [name] is a 29 y.o. female, who is here today with above concern. Intermittent left nostril bleed for a few days. Last episode 3 days ago. Exacerbated by blowing her nose. It takes about 10 min to stop bleeding. No prior Hx.  11/12 to 10/15/20 she was sick with URI , "bad sinus" and ear infection.She was treated with abx, dexamethasone,and Albuterol inh. Symptoms have resolved.  No other source of bleeding. Negative for fever,chills,body aches,sore throat,or oral lesions.  She has gone through a lot of stress in the past year, family member have died,she was not taken her medications.  Insomnia: She was on Trazodone and Amitriptyline until a year ago. Amitriptyline was helping with headaches. Trazodone 50 mg was added to help her sleep. She does not remember who prescribed it. She is not sure if Amitriptyline alone was also helping with sleep.  Stopped both medications because she was not waking up on time for work.  She has difficulty falling asleep. She goes to bed around 9 pm and takes her a couple hours to fall asleep. Sleeps about 6 hours. She does not always feel rested next day. No known hx of OSA and she usually does not snore.  She wakes up with headaches daily, it does not last all day. Headache was better controlled when she was taking Amitriptyline.  Hypothyroidism: She has not taken Levothyroxine 300 mcg for a year. Last visit with Dr Talmage Nap in 12/2018. + Fatigue. Last  TSH in 12/2018.  Review of Systems  Constitutional: Negative for activity change and appetite change.  Eyes: Negative for redness and visual disturbance.  Respiratory: Negative for cough, shortness of breath and wheezing.   Cardiovascular: Negative for chest pain and leg swelling.  Gastrointestinal: Negative for abdominal pain, nausea and vomiting.       Negative for changes in bowel habits.  Genitourinary:  Negative for decreased urine volume and hematuria.  Skin: Negative for pallor and rash.  Allergic/Immunologic: Positive for environmental allergies.  Neurological: Negative for syncope, facial asymmetry and weakness.  Psychiatric/Behavioral: Positive for sleep disturbance. Negative for confusion.  Rest see pertinent positives and negatives per HPI.  Current Outpatient Medications on File Prior to Visit  Medication Sig Dispense Refill  . amitriptyline (ELAVIL) 25 MG tablet Take 1 tablet (25 mg total) by mouth at bedtime. 90 tablet 2  . levonorgestrel (MIRENA) 20 MCG/24HR IUD 1 each by Intrauterine route once.    Marland Kitchen levothyroxine (SYNTHROID) 300 MCG tablet Take 1 tablet (300 mcg total) by mouth daily before breakfast. 30 tablet 0  . metFORMIN (GLUCOPHAGE-XR) 500 MG 24 hr tablet Take 1 tablet (500 mg total) by mouth daily with breakfast. 30 tablet 0  . ondansetron (ZOFRAN) 8 MG tablet Take 1 tablet (8 mg total) by mouth every 8 (eight) hours as needed for nausea. 60 tablet 0  . omeprazole (PRILOSEC) 40 MG capsule Take 1 capsule (40 mg total) by mouth daily. (Patient not taking: Reported on 12/03/2019) 30 capsule 1   No current facility-administered medications on file prior to visit.   Past Medical History:  Diagnosis Date  . ACL tear 05/12/2014   injured on trampoline  . Diabetes mellitus without complication (HCC)   . Headache(784.0)    stress  . Polycystic ovarian syndrome    no current med.  . Thyroid disease    No Known Allergies  Social History   Socioeconomic History  .  Marital status: Married    Spouse name: Not on file  . Number of children: 0  . Years of education: College/RN  . Highest education level: Not on file  Occupational History    Employer: [employer]    Comment: ED-RN  Tobacco Use  . Smoking status: Never Smoker  . Smokeless tobacco: Never Used  Vaping Use  . Vaping Use: Never used  Substance and Sexual Activity  . Alcohol use: No    Alcohol/week: 0.0  standard drinks  . Drug use: No  . Sexual activity: Not on file  Other Topics Concern  . Not on file  Social History Narrative   Patient lives at home with family.   Caffeine Use:1-2 cups   Social Determinants of Health   Financial Resource Strain:   . Difficulty of Paying Living Expenses: Not on file  Food Insecurity:   . Worried About Programme researcher, broadcasting/film/videounning Out of Food in the Last Year: Not on file  . Ran Out of Food in the Last Year: Not on file  Transportation Needs:   . Lack of Transportation (Medical): Not on file  . Lack of Transportation (Non-Medical): Not on file  Physical Activity:   . Days of Exercise per Week: Not on file  . Minutes of Exercise per Session: Not on file  Stress:   . Feeling of Stress : Not on file  Social Connections:   . Frequency of Communication with Friends and Family: Not on file  . Frequency of Social Gatherings with Friends and Family: Not on file  . Attends Religious Services: Not on file  . Active Member of Clubs or Organizations: Not on file  . Attends BankerClub or Organization Meetings: Not on file  . Marital Status: Not on file    Vitals:   10/20/20 0703  BP: 124/70  Pulse: 69  Resp: 16  SpO2: 96%   Body mass index is 43.42 kg/m.  Physical Exam Constitutional:      General: She is not in acute distress.    Appearance: She is well-developed. She is not ill-appearing.  HENT:     Head: Atraumatic.     Right Ear: Tympanic membrane, ear canal and external ear normal.     Left Ear: Tympanic membrane, ear canal and external ear normal.     Nose: No mucosal edema or rhinorrhea.     Right Nostril: No epistaxis.     Left Nostril: No epistaxis.     Left Sinus: No maxillary sinus tenderness.     Comments: Hyperemic nasal mucosa.    Mouth/Throat:     Mouth: Mucous membranes are moist.     Pharynx: Oropharynx is clear.  Eyes:     Conjunctiva/sclera: Conjunctivae normal.  Cardiovascular:     Rate and Rhythm: Normal rate and regular rhythm.     Heart  sounds: No murmur heard.   Pulmonary:     Effort: Pulmonary effort is normal. No respiratory distress.     Breath sounds: Normal breath sounds. No stridor.  Musculoskeletal:     Cervical back: No edema or erythema. No muscular tenderness.  Lymphadenopathy:     Head:     Right side of head: No submandibular adenopathy.     Left side of head: No submandibular adenopathy.     Cervical: No cervical adenopathy.  Skin:    General: Skin is warm.     Findings: No erythema or rash.  Neurological:     Mental Status: She is alert and oriented  to person, place, and time.  Psychiatric:        Speech: Speech normal.     Comments: Well groomed, good eye contact.    ASSESSMENT AND PLAN: Whitney Aguirre was seen today for nose bleeds.  Diagnoses and all orders for this visit: Orders Placed This Encounter  Procedures  . TSH   Lab Results  Component Value Date   TSH 132.17 (H) 10/20/2020   Anterior epistaxis Hx and examination today do not suggest a serious process. Dryness and allergies could be contributing factors. Humidifier with cool mist will help some. KY gel on nasal mucosa, rubbing on septum with 5th finger as needed may also help. Try to avoid trigger factors. I do not think further work up is needed at this time but will be considered if problem becomes persistent despite of above measures or gets worse.  Insomnia disorder Good sleep hygiene. Melatonin 5-10 mg 2 hours before bedtime may help. Amitriptyline 25 mg resumed today, we can increase dose if needed for sleep.  Hypothyroidism She has not been on Levothyroxine for a year. Arrange f/u appt with Dr Talmage Nap. Further recommendations according to TSH result.  Headache, unspecified headache type Tension like headache. Other possible etiologies discussed. Problem is not well controlled. Amitriptyline 25 mg was helping,so she will resume.   Return in about 3 months (around 01/20/2021) for insomnia and HA.   Brance Dartt G.  [place], MD  [facility]. [place] [facility].   A few things to remember from today's visit:  Anterior epistaxis  Headache, unspecified headache type  Insomnia, unspecified type  Resume Amitriptyline 25 mg 30-45 min before bedtime. Good hygiene. Melatonin 2 hours before bedtime may help.  Small amount of KY on nose/septum may help to prevent bleeding. Dry hot environment and rhinitis may be aggravating problem.  Nosebleed, Adult A nosebleed is when blood comes out of the nose. Nosebleeds are common. Usually, they are not a sign of a serious condition. Nosebleeds can happen if a small blood vessel in your nose starts to bleed or if the lining of your nose (mucous membrane) cracks. They are commonly caused by:  Allergies.  Colds.  Picking your nose.  Blowing your nose too hard.  An injury from sticking an object into your nose or getting hit in the nose.  Dry or cold air. Less common causes of nosebleeds include:  Toxic fumes.  Something abnormal in the nose or in the air-filled spaces in the bones of the face (sinuses).  Growths in the nose, such as polyps.  Medicines or conditions that cause blood to clot slowly.  Certain illnesses or procedures that irritate or dry out the nasal passages. Follow these instructions at home: When you have a nosebleed:   Sit down and tilt your head slightly forward.  Use a clean towel or tissue to pinch your nostrils under the bony part of your nose. After 10 minutes, let go of your nose and see if bleeding starts again. Do not release pressure before that time. If there is still bleeding, repeat the pinching and holding for 10 minutes until the bleeding stops.  Do not place tissues or gauze in the nose to stop bleeding.  Avoid lying down and avoid tilting your head backward. That may make blood collect in the throat and cause gagging or coughing.  Use a nasal spray decongestant to help with a nosebleed as told by your  health care provider.  Do not use petroleum jelly or mineral oil in your  nose. It can drip into your lungs. After a nosebleed:  Avoid blowing your nose or sniffing for a number of hours.  Avoid straining, lifting, or bending at the waist for several days. You may resume other normal activities as you are able.  Use saline spray or a humidifier as told by your health care provider.  Aspirinand blood thinners make bleeding more likely. If you are prescribed these medicines and you suffer from nosebleeds: ? Ask your health care provider if you should stop taking the medicines or if you should adjust the dose. ? Do not stop taking medicines that your health care provider has recommended unless told by your health care provider.  If your nosebleed was caused by dry mucous membranes, use over-the-counter saline nasal spray or gel. This will keep the mucous membranes moist and allow them to heal. If you must use a lubricant: ? Choose one that is water-soluble. ? Use only as much as you need and use it only as often as needed. ? Do not lie down until several hours after you use it. Contact a health care provider if:  You have a fever.  You get nosebleeds often or more often than usual.  You bruise very easily.  You have a nosebleed from having something stuck in your nose.  You have bleeding in your mouth.  You vomit or cough up brown material.  You have a nosebleed after you start a new medicine. Get help right away if:  You have a nosebleed after a fall or a head injury.  Your nosebleed does not go away after 20 minutes.  You feel dizzy or weak.  You have unusual bleeding from other parts of your body.  You have unusual bruising on other parts of your body.  You become sweaty.  You vomit blood. This information is not intended to replace advice given to you by your health care provider. Make sure you discuss any questions you have with your health care provider. Document  Revised: 02/12/2018 Document Reviewed: 05/30/2016 Elsevier Patient Education  [employer].  If you need refills please call your pharmacy. Do not use My Chart to request refills or for acute issues that need immediate attention.    Please be sure medication list is accurate. If a new problem present, please set up appointment sooner than planned today.

## 2020-10-20 NOTE — Assessment & Plan Note (Addendum)
Good sleep hygiene. Melatonin 5-10 mg 2 hours before bedtime may help. Amitriptyline 25 mg resumed today, we can increase dose if needed for sleep.

## 2020-10-20 NOTE — Assessment & Plan Note (Signed)
She has not been on Levothyroxine for a year. Arrange f/u appt with Dr Talmage Nap. Further recommendations according to TSH result.

## 2020-10-21 LAB — TSH: TSH: 132.17 mIU/L — ABNORMAL HIGH

## 2020-10-22 ENCOUNTER — Other Ambulatory Visit: Payer: Self-pay | Admitting: Family Medicine

## 2020-10-22 ENCOUNTER — Encounter: Payer: Self-pay | Admitting: Family Medicine

## 2020-10-22 MED ORDER — LEVOTHYROXINE SODIUM 300 MCG PO TABS: 300.0000 ug | ORAL_TABLET | Freq: Every day | ORAL | 1 refills | Status: DC

## 2020-11-05 ENCOUNTER — Other Ambulatory Visit: Payer: Self-pay | Admitting: Endocrinology

## 2020-11-05 DIAGNOSIS — E063 Autoimmune thyroiditis: Secondary | ICD-10-CM

## 2021-01-10 ENCOUNTER — Ambulatory Visit
Admission: RE | Admit: 2021-01-10 | Discharge: 2021-01-10 | Disposition: A | Discharge status: Home / Self Care | Payer: BC Managed Care – PPO | Source: Ambulatory Visit | Attending: Endocrinology | Admitting: Endocrinology

## 2021-01-10 DIAGNOSIS — E069 Thyroiditis, unspecified: Secondary | ICD-10-CM | POA: Diagnosis not present

## 2021-01-10 DIAGNOSIS — E041 Nontoxic single thyroid nodule: Secondary | ICD-10-CM | POA: Diagnosis not present

## 2021-01-10 DIAGNOSIS — E063 Autoimmune thyroiditis: Secondary | ICD-10-CM

## 2021-01-13 ENCOUNTER — Encounter: Payer: Self-pay | Admitting: Family Medicine

## 2021-01-17 ENCOUNTER — Other Ambulatory Visit: Payer: Self-pay | Admitting: Family Medicine

## 2021-01-17 DIAGNOSIS — R040 Epistaxis: Secondary | ICD-10-CM

## 2021-01-25 ENCOUNTER — Other Ambulatory Visit: Payer: Self-pay

## 2021-01-26 ENCOUNTER — Encounter: Payer: Self-pay | Admitting: Family Medicine

## 2021-01-26 ENCOUNTER — Ambulatory Visit: Payer: BC Managed Care – PPO | Admitting: Family Medicine

## 2021-01-26 VITALS — BP 110/70 | HR 74 | Resp 12 | Ht 67.0 in | Wt 248.0 lb

## 2021-01-26 DIAGNOSIS — F419 Anxiety disorder, unspecified: Secondary | ICD-10-CM | POA: Diagnosis not present

## 2021-01-26 DIAGNOSIS — J309 Allergic rhinitis, unspecified: Secondary | ICD-10-CM

## 2021-01-26 DIAGNOSIS — F321 Major depressive disorder, single episode, moderate: Secondary | ICD-10-CM | POA: Insufficient documentation

## 2021-01-26 DIAGNOSIS — R059 Cough, unspecified: Secondary | ICD-10-CM

## 2021-01-26 MED ORDER — FLUTICASONE PROPIONATE 50 MCG/ACT NA SUSP: 1.0000 | Freq: Two times a day (BID) | NASAL | 2 refills | Status: DC

## 2021-01-26 MED ORDER — FLUOXETINE HCL 20 MG PO CAPS: 20.0000 mg | ORAL_CAPSULE | Freq: Every day | ORAL | 1 refills | Status: DC

## 2021-01-26 NOTE — Patient Instructions (Addendum)
A few things to remember from today's visit:   Cough  Allergic rhinitis, unspecified seasonality, unspecified trigger  Depression, major, single episode, moderate (HCC)  If you need refills please call your pharmacy. Do not use My Chart to request refills or for acute issues that need immediate attention.  Nasal saline irrigations as needed.  Today we started Fluoxetine, this type of medications can increase suicidal risk. This is more prevalent among children,adolecents, and young adults with major depression or other psychiatric disorders. It can also make depression worse. Most common side effects are gastrointestinal, self limited after a few weeks: diarrhea, nausea, constipation  Or diarrhea among some.  In general it is well tolerated. We will follow closely.      Please be sure medication list is accurate. If a new problem present, please set up appointment sooner than planned today.

## 2021-01-26 NOTE — Progress Notes (Signed)
Chief Complaint  Patient presents with  . Anxiety  . Depression   HPI: Whitney Aguirre is a 30 y.o. female, who is here today complaining of worsening depression and anxiety. Problem has been going on for 3-4 months. She is seeing a therapist weekly, 4-5 times so far.  There have been some events in the past few months that have exacerbated problem. Her MGM died a few months ago and her father is now living with her. Estranged from father while growing up, she did not have contact with him until a couple months ago. A lot of stress at home, she does not enjoy being there any more.  Her father does not have a place to be and his other children do not have a good relation with him. Her husband is supportive.  Sleeping 4-5 hours. She has had thoughts about being better off dealt but denies suicidal ideation or having a plan. In 2014 she tried to commit suicide, took a bottle of Advil. She has been on Zoloft and Trintellix in the past. Brother with possible bipolar disorder.  She is on Amitriptyline 25 mg daily, which helps with migraine headaches.  Depression screen [facility] 2/9 01/26/2021 11/10/2017  Decreased Interest 2 0  Down, Depressed, Hopeless 3 0  PHQ - 2 Score 5 0  Altered sleeping 3 -  Tired, decreased energy 3 -  Change in appetite 1 -  Feeling bad or failure about yourself  3 -  Trouble concentrating 2 -  Moving slowly or fidgety/restless 1 -  Suicidal thoughts 3 -  PHQ-9 Score 21 -  Difficult doing work/chores Very difficult -   GAD 7 : Generalized Anxiety Score 01/26/2021  Nervous, Anxious, on Edge 3  Control/stop worrying 3  Worry too much - different things 3  Trouble relaxing 3  Restless 2  Easily annoyed or irritable 3  Afraid - awful might happen 2  Total GAD 7 Score 19  Anxiety Difficulty Very difficult   She is also c/o persistent cough that has been persistent since she had COVID 19 infection, 09/2019. Non productive most of the time. No associated  wheezing or SOB. Problem doe snot interfere with sleep. It happens during the day. Exacerbated by heat. She feels like a "tickle" in her throat. Nasal congestion, rhinorrhea,and post nasal drainage.  She has not tried OTC medications.  Review of Systems  Constitutional: Positive for appetite change and fatigue. Negative for fever.  HENT: Negative for mouth sores and nosebleeds.   Cardiovascular: Negative for chest pain and palpitations.  Gastrointestinal: Negative for abdominal pain, nausea and vomiting.       Negative for changes in bowel habits.  Musculoskeletal: Negative for gait problem and myalgias.  Allergic/Immunologic: Positive for environmental allergies.  Neurological: Negative for syncope and weakness.  Psychiatric/Behavioral: Negative for confusion and hallucinations. The patient is nervous/anxious.   Rest see pertinent positives and negatives per HPI.  Current Outpatient Medications on File Prior to Visit  Medication Sig Dispense Refill  . amitriptyline (ELAVIL) 25 MG tablet Take 1 tablet (25 mg total) by mouth at bedtime. 90 tablet 2  . levonorgestrel (MIRENA) 20 MCG/24HR IUD 1 each by Intrauterine route once.    Marland Kitchen levothyroxine (SYNTHROID) 300 MCG tablet Take 1 tablet (300 mcg total) by mouth daily before breakfast. 30 tablet 1  . metFORMIN (GLUCOPHAGE-XR) 500 MG 24 hr tablet Take 1 tablet (500 mg total) by mouth daily with breakfast. 30 tablet 0  . ondansetron (ZOFRAN) 8 MG  tablet Take 1 tablet (8 mg total) by mouth every 8 (eight) hours as needed for nausea. 60 tablet 0   No current facility-administered medications on file prior to visit.   Past Medical History:  Diagnosis Date  . ACL tear 05/12/2014   injured on trampoline  . Diabetes mellitus without complication (HCC)   . Headache(784.0)    stress  . Polycystic ovarian syndrome    no current med.  . Thyroid disease    No Known Allergies  Social History   Socioeconomic History  . Marital status:  Married    Spouse name: Not on file  . Number of children: 0  . Years of education: College/RN  . Highest education level: Not on file  Occupational History    Employer: [employer]    Comment: ED-RN  Tobacco Use  . Smoking status: Never Smoker  . Smokeless tobacco: Never Used  Vaping Use  . Vaping Use: Never used  Substance and Sexual Activity  . Alcohol use: No    Alcohol/week: 0.0 standard drinks  . Drug use: No  . Sexual activity: Not on file  Other Topics Concern  . Not on file  Social History Narrative   Patient lives at home with family.   Caffeine Use:1-2 cups   Social Determinants of Health   Financial Resource Strain: Not on file  Food Insecurity: Not on file  Transportation Needs: Not on file  Physical Activity: Not on file  Stress: Not on file  Social Connections: Not on file   Vitals:   01/26/21 0709  BP: 110/70  Pulse: 74  Resp: 12  SpO2: 98%   Body mass index is 38.84 kg/m.  Physical Exam Vitals and nursing note reviewed.  Constitutional:      General: She is not in acute distress.    Appearance: She is well-developed and well-groomed.  HENT:     Head: Normocephalic and atraumatic.     Nose: Septal deviation and rhinorrhea present. No mucosal edema.     Right Turbinates: Enlarged.     Left Turbinates: Not enlarged.     Mouth/Throat:     Mouth: Oropharynx is clear and moist and mucous membranes are normal.  Eyes:     Extraocular Movements: EOM normal.     Conjunctiva/sclera: Conjunctivae normal.  Cardiovascular:     Rate and Rhythm: Normal rate and regular rhythm.     Heart sounds: No murmur heard.   Pulmonary:     Effort: Pulmonary effort is normal. No respiratory distress.     Breath sounds: Normal breath sounds.  Musculoskeletal:        General: No edema.  Skin:    General: Skin is warm.     Findings: No erythema.  Neurological:     General: No focal deficit present.     Mental Status: She is alert and oriented to person,  place, and time.     Gait: Gait normal.     Deep Tendon Reflexes: Strength normal.  Psychiatric:        Attention and Perception: Attention normal.        Mood and Affect: Mood and affect normal. Affect is not labile.        Speech: Speech normal.        Thought Content: Thought content does not include homicidal or suicidal ideation. Thought content does not include homicidal or suicidal plan.        Cognition and Memory: Cognition and memory normal.   ASSESSMENT  AND PLAN:  Whitney Aguirre was seen today for anxiety and depression.  Diagnoses and all orders for this visit:  Cough We discussed possible etiologies. I do not think imaging is needed today. Will try treating allergies and monitor for changes.  Allergic rhinitis, unspecified seasonality, unspecified trigger Flonase nasal spray daily prn and nasal saline irrigations as needed.  -     fluticasone (FLONASE) 50 MCG/ACT nasal spray; Place 1 spray into both nostrils 2 (two) times daily.  Depression, major, single episode, moderate (HCC) We discussed pharmacologic treatment options and side effects, including increasing suicidal thoughts. She agrees with trying Fluoxetine 20 mg daily. Continue CBT. Her father is supposed to be moving at the end of this month, this will help tremendously. Instructed about warning signs.  -     FLUoxetine (PROZAC) 20 MG capsule; Take 1 capsule (20 mg total) by mouth daily.  Anxiety disorder, unspecified type Fluoxetine 20 mg started today. Continue CBT weekly.  -     FLUoxetine (PROZAC) 20 MG capsule; Take 1 capsule (20 mg total) by mouth daily.   Return in about 5 weeks (around 03/02/2021).   Overton Boggus G. [place], MD  [facility]. [place] office.  A few things to remember from today's visit:   Cough  Allergic rhinitis, unspecified seasonality, unspecified trigger  Depression, major, single episode, moderate (HCC)  If you need refills please call your pharmacy. Do not use  My Chart to request refills or for acute issues that need immediate attention.  Nasal saline irrigations as needed.  Today we started Fluoxetine, this type of medications can increase suicidal risk. This is more prevalent among children,adolecents, and young adults with major depression or other psychiatric disorders. It can also make depression worse. Most common side effects are gastrointestinal, self limited after a few weeks: diarrhea, nausea, constipation  Or diarrhea among some.  In general it is well tolerated. We will follow closely.  Please be sure medication list is accurate. If a new problem present, please set up appointment sooner than planned today.

## 2021-01-27 ENCOUNTER — Encounter: Payer: Self-pay | Admitting: Family Medicine

## 2021-03-03 ENCOUNTER — Other Ambulatory Visit: Payer: Self-pay

## 2021-03-04 ENCOUNTER — Encounter: Payer: Self-pay | Admitting: Family Medicine

## 2021-03-04 ENCOUNTER — Ambulatory Visit (INDEPENDENT_AMBULATORY_CARE_PROVIDER_SITE_OTHER): Payer: BC Managed Care – PPO | Admitting: Family Medicine

## 2021-03-04 VITALS — BP 110/70 | HR 92 | Resp 12 | Ht 67.0 in | Wt 270.5 lb

## 2021-03-04 DIAGNOSIS — F321 Major depressive disorder, single episode, moderate: Secondary | ICD-10-CM | POA: Diagnosis not present

## 2021-03-04 DIAGNOSIS — G4700 Insomnia, unspecified: Secondary | ICD-10-CM

## 2021-03-04 DIAGNOSIS — F419 Anxiety disorder, unspecified: Secondary | ICD-10-CM

## 2021-03-04 NOTE — Patient Instructions (Signed)
A few things to remember from today's visit:   Anxiety disorder, unspecified type  Depression, major, single episode, moderate (HCC)  If you need refills please call your pharmacy. Do not use My Chart to request refills or for acute issues that need immediate attention.   No changes today.  Please be sure medication list is accurate. If a new problem present, please set up appointment sooner than planned today.

## 2021-03-04 NOTE — Assessment & Plan Note (Signed)
We discussed the importance pf getting adequate sleep, at least 7 hours. Try to improve sleep hygiene and continue Amitriptyline 25 mg at bedtime.

## 2021-03-04 NOTE — Progress Notes (Addendum)
HPI: Ms.Rumaysa Abbigal Radich is a 30 y.o. female, who is here today to follow on recent OV/ER.  She was last seen on 01/26/21, when Fluoxetine was started to treat worsening depression and anxiety. She has tolerated Fluoxetine well. Mild diarrhea initially.  In general she feels like medication has helped. She is not as "emosional" and does not cry as much as she did.  Stress at home is getting worse and this is what is aggravating problem. Marland Kitchen Her father moved with her a few months ago, this was going to be temporal, he was supposed to move out but he did not. She has not been closed to her father,he was not present during most of her childhood and recently he did reach out to her because he needed a place to stay.  She does not feel able to tell him to leave verbally,planning on writing a letter asking him to do so.  Sleeping about 5-6 hours, sometimes she goes to bed around 12 midnight to 1 am, has difficulty falling asleep.She is on Amitriptyline 25 mg to treat headaches but she does not take it when she goes to bed late.  She is seeing a therapist weekly. Denies suicidal ideation or a plan but she feels like she would be better death off.  She also feels like she is putting some stress in her marriage but so far her husband has been supportive.  Depression screen [facility] 2/9 03/04/2021 01/27/2021 11/10/2017  Decreased Interest 2 2 0  Down, Depressed, Hopeless 2 3 0  PHQ - 2 Score 4 5 0  Altered sleeping 3 3 -  Tired, decreased energy 3 3 -  Change in appetite 2 1 -  Feeling bad or failure about yourself  3 3 -  Trouble concentrating 2 2 -  Moving slowly or fidgety/restless 1 1 -  Suicidal thoughts 2 3 -  PHQ-9 Score 20 21 -  Difficult doing work/chores Very difficult Very difficult -   GAD 7 : Generalized Anxiety Score 03/04/2021 01/27/2021  Nervous, Anxious, on Edge 2 3  Control/stop worrying 3 3  Worry too much - different things 2 3  Trouble relaxing 3 3  Restless 3 2  Easily  annoyed or irritable 2 3  Afraid - awful might happen 2 2  Total GAD 7 Score 17 19  Anxiety Difficulty Very difficult Very difficult   She has not made major changes in her diet, it is difficult for her to lose wt due to thyroid disease. She has an appt with her endocrinologist on 03/17/21. She ahs been taking her Levothyroxine daily.  Review of Systems  Constitutional: Negative for chills and fever.  HENT: Negative for mouth sores and nosebleeds.   Respiratory: Negative for shortness of breath.   Cardiovascular: Negative for chest pain and leg swelling.  Neurological: Negative for syncope, facial asymmetry and weakness.  Psychiatric/Behavioral: Positive for sleep disturbance. Negative for confusion and hallucinations.  Rest see pertinent positives and negatives per HPI.  Current Outpatient Medications on File Prior to Visit  Medication Sig Dispense Refill  . amitriptyline (ELAVIL) 25 MG tablet Take 1 tablet (25 mg total) by mouth at bedtime. 90 tablet 2  . FLUoxetine (PROZAC) 20 MG capsule Take 1 capsule (20 mg total) by mouth daily. 30 capsule 1  . fluticasone (FLONASE) 50 MCG/ACT nasal spray Place 1 spray into both nostrils 2 (two) times daily. 16 g 2  . levonorgestrel (MIRENA) 20 MCG/24HR IUD 1 each by  Intrauterine route once.    Marland Kitchen levothyroxine (SYNTHROID) 300 MCG tablet Take 1 tablet (300 mcg total) by mouth daily before breakfast. 30 tablet 1  . metFORMIN (GLUCOPHAGE-XR) 500 MG 24 hr tablet Take 1 tablet (500 mg total) by mouth daily with breakfast. 30 tablet 0  . ondansetron (ZOFRAN) 8 MG tablet Take 1 tablet (8 mg total) by mouth every 8 (eight) hours as needed for nausea. 60 tablet 0   No current facility-administered medications on file prior to visit.     Past Medical History:  Diagnosis Date  . ACL tear 05/12/2014   injured on trampoline  . Diabetes mellitus without complication (HCC)   . Headache(784.0)    stress  . Polycystic ovarian syndrome    no current med.   . Thyroid disease    No Known Allergies  Social History   Socioeconomic History  . Marital status: Married    Spouse name: Not on file  . Number of children: 0  . Years of education: College/RN  . Highest education level: Not on file  Occupational History    Employer: [employer]    Comment: ED-RN  Tobacco Use  . Smoking status: Never Smoker  . Smokeless tobacco: Never Used  Vaping Use  . Vaping Use: Never used  Substance and Sexual Activity  . Alcohol use: No    Alcohol/week: 0.0 standard drinks  . Drug use: No  . Sexual activity: Not on file  Other Topics Concern  . Not on file  Social History Narrative   Patient lives at home with family.   Caffeine Use:1-2 cups   Social Determinants of Health   Financial Resource Strain: Not on file  Food Insecurity: Not on file  Transportation Needs: Not on file  Physical Activity: Not on file  Stress: Not on file  Social Connections: Not on file    Vitals:   03/04/21 0658  BP: 110/70  Pulse: 92  Resp: 12  SpO2: 98%   Wt Readings from Last 3 Encounters:  03/04/21 270 lb 8 oz (122.7 kg)  01/26/21 248 lb (112.5 kg)  10/20/20 277 lb 4 oz (125.8 kg)   Body mass index is 42.37 kg/m.  Physical Exam Vitals and nursing note reviewed.  Constitutional:      General: She is not in acute distress.    Appearance: She is well-developed.  HENT:     Head: Normocephalic and atraumatic.  Eyes:     Conjunctiva/sclera: Conjunctivae normal.  Cardiovascular:     Rate and Rhythm: Normal rate and regular rhythm.     Pulses:          Dorsalis pedis pulses are 2+ on the right side and 2+ on the left side.     Heart sounds: No murmur heard.   Pulmonary:     Effort: Pulmonary effort is normal. No respiratory distress.     Breath sounds: Normal breath sounds.  Abdominal:     Palpations: Abdomen is soft. There is no hepatomegaly or mass.     Tenderness: There is no abdominal tenderness.  Lymphadenopathy:     Cervical: No  cervical adenopathy.  Skin:    General: Skin is warm.     Findings: No erythema or rash.  Neurological:     Mental Status: She is alert and oriented to person, place, and time.     Gait: Gait normal.  Psychiatric:     Comments: Well groomed, good eye contact.   ASSESSMENT AND PLAN:  Ms.Lameshia  was seen today for follow-up.  Diagnoses and all orders for this visit:  Anxiety disorder, unspecified The way she deals with a very stressful situation at home right now has improved.  Continue Fluoxetine 20 mg daily, she will let me know if she wants to increase dose to 40 mg in about 2 weeks. Some side effects discussed. Continue CBT.  Depression, major, single episode, moderate (HCC) Reporting improvement. PHQ stable overall. Fluoxetine 20 mg daily has helped some, she is able to deal better with stress at home. She will let me know if she wants to try a higher dose of Fluoxetine. Continue weekly CBT. At this time she is not suicidal or homicidal. She has a plan in case she starts with suicidal ideation. Instructed about warning sings.   Insomnia disorder We discussed the importance pf getting adequate sleep, at least 7 hours. Try to improve sleep hygiene and continue Amitriptyline 25 mg at bedtime.  Morbid obesity (HCC) Wt last visit is not accurate. It seems like she may have lost 7 Lb. She understands benefits of wt loss as well as adverse effects of obesity. Consistency with healthy diet and physical activity encouraged.   Return in about 4 months (around 07/04/2021) for 3-4 months f/u, before if needed..   Isham Smitherman G. [place], MD  [facility]. [place] office.   A few things to remember from today's visit:   Anxiety disorder, unspecified type  Depression, major, single episode, moderate (HCC)  If you need refills please call your pharmacy. Do not use My Chart to request refills or for acute issues that need immediate attention.   No changes  today.  Please be sure medication list is accurate. If a new problem present, please set up appointment sooner than planned today.

## 2021-03-04 NOTE — Assessment & Plan Note (Addendum)
Reporting improvement. PHQ stable overall. Fluoxetine 20 mg daily has helped some, she is able to deal better with stress at home. She will let me know if she wants to try a higher dose of Fluoxetine. Continue weekly CBT. At this time she is not suicidal or homicidal. She has a plan in case she starts with suicidal ideation. Instructed about warning sings.

## 2021-03-04 NOTE — Assessment & Plan Note (Addendum)
The way she deals with a very stressful situation at home right now has improved.  Continue Fluoxetine 20 mg daily, she will let me know if she wants to increase dose to 40 mg in about 2 weeks. Some side effects discussed. Continue CBT.

## 2021-03-04 NOTE — Assessment & Plan Note (Signed)
Wt last visit is not accurate. It seems like she may have lost 7 Lb. She understands benefits of wt loss as well as adverse effects of obesity. Consistency with healthy diet and physical activity encouraged.

## 2021-03-08 ENCOUNTER — Other Ambulatory Visit: Payer: Self-pay | Admitting: Family Medicine

## 2021-03-08 DIAGNOSIS — F321 Major depressive disorder, single episode, moderate: Secondary | ICD-10-CM

## 2021-03-08 DIAGNOSIS — F419 Anxiety disorder, unspecified: Secondary | ICD-10-CM

## 2021-03-17 ENCOUNTER — Other Ambulatory Visit: Payer: Self-pay | Admitting: Endocrinology

## 2021-03-17 DIAGNOSIS — E063 Autoimmune thyroiditis: Secondary | ICD-10-CM

## 2021-04-10 ENCOUNTER — Encounter: Payer: Self-pay | Admitting: Family Medicine

## 2021-04-11 ENCOUNTER — Other Ambulatory Visit: Payer: Self-pay | Admitting: Family Medicine

## 2021-04-18 MED ORDER — ONDANSETRON HCL 8 MG PO TABS: 8.0000 mg | ORAL_TABLET | Freq: Every day | ORAL | 0 refills | Status: DC | PRN

## 2021-06-14 ENCOUNTER — Other Ambulatory Visit: Payer: Self-pay

## 2021-06-14 ENCOUNTER — Encounter: Payer: Self-pay | Admitting: Registered"

## 2021-06-14 ENCOUNTER — Encounter: Payer: BC Managed Care – PPO | Attending: Family Medicine | Admitting: Registered"

## 2021-06-14 DIAGNOSIS — E669 Obesity, unspecified: Secondary | ICD-10-CM

## 2021-06-14 NOTE — Progress Notes (Signed)
Medical Nutrition Therapy  Appointment Start time: 8:05  Appointment End time:  9:11  Primary concerns today: wants to eat better, better eating habits Referral diagnosis: obesity Preferred learning style: no preference indicated Learning readiness: change in progress   NUTRITION ASSESSMENT   States she has had a few hard times financially since dad has moved out in 02/2020; reports some food insecurity.   States she was prescribed Wegovy but was unable to get it. States she started taking Saxenda instead. Reports decreased appetite since taking Saxenda. States she is eating smaller portions but feels like she is not eating right. States she eats from a saucer. Reports she is in social media groups and she learns about needing to eat good carbs, good sugars, good fats, etc.  States the information is confusing to her and needs clarity.   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 States she will eat when bored or emotional. States she will eat or cry when stressed.   Reports she lives close to a park but has not made a plan to go yet. States she wants to go to the gym because the weather is so hot. States she doesn't to be looked at while at the gym and is a barrier for her. Reports she likes walking, biking, and swimming for movement.    Clinical Medical Hx: hypothyroidism, Hashimoto's disease Medications: See list Labs: See list Notable Signs/Symptoms: none reported  Lifestyle & Dietary Hx  Estimated daily fluid intake: 32 oz Supplements: See list Sleep: 5-6.5 hrs Stress / self-care: driving or reading Current average weekly physical activity: takes stairs at home often  24-Hr Dietary Recall First Meal (7:30 am): small bagel + cream cheese + almond milk  Snack:  Second Meal (12:30 pm): [place] - 5 chips + dip + 1/3 plate steak + sweet tea Snack:  Third Meal (6 pm): 1 slice of cheese pizza  Snack:  Beverages: almond milk, sweet tea, Seagram's (6 oz)   NUTRITION DIAGNOSIS  NB-1.1  Food and nutrition-related knowledge deficit As related to balanced eating.  As evidenced by dietary recall.   NUTRITION INTERVENTION  Nutrition education (E-1) on the following topics: Nutrition education and counseling. Pt was educated on the benefits of eating a variety of food groups at each meal. Discussed the purpose of each food group and ways to create balance with already established regimen. Discussed eating every 3-5 hours to help adequately nourish body and ways to increase water intake. Discussed importance of physical activity. Pt agreed with goals listed.   Handouts Provided Include  Start Simple with My Plate  Learning Style & Readiness for Change Teaching method utilized: Visual & Auditory  Demonstrated degree of understanding via: Teach Back  Barriers to learning/adherence to lifestyle change: none identified  Goals Established by Pt Aim to have meals/snacks every 3-5 hours.  Aim to balance meals with lean protein, fruit, vegetables, dairy, and whole grains.  Aim to increase fiber intake with whole grains, nuts, seeds, fruits, and vegetables.  Aim to increase fluid intake to at least 64 oz a day.  Aim to increase activity with walking to at least 15-20 min, 2x/week.    MONITORING & EVALUATION Dietary intake, weekly physical activity.  Next Steps  Patient is to follow up in 7 weeks.

## 2021-06-14 NOTE — Patient Instructions (Addendum)
-   Aim to have meals/snacks every 3-5 hours.   - Aim to balance meals with lean protein, fruit, vegetables, dairy, and whole grains.   - Aim to increase fiber intake with whole grains, nuts, seeds, fruits, and vegetables.   - Aim to increase fluid intake to at least 64 oz a day.   - Aim to increase activity with walking to at least 15-20 min, 2x/week.

## 2021-07-07 ENCOUNTER — Other Ambulatory Visit: Payer: Self-pay | Admitting: Family Medicine

## 2021-07-14 ENCOUNTER — Encounter: Payer: Self-pay | Admitting: Family Medicine

## 2021-07-14 DIAGNOSIS — F321 Major depressive disorder, single episode, moderate: Secondary | ICD-10-CM

## 2021-07-14 DIAGNOSIS — F419 Anxiety disorder, unspecified: Secondary | ICD-10-CM

## 2021-07-15 ENCOUNTER — Encounter: Payer: Self-pay | Admitting: Family Medicine

## 2021-07-15 NOTE — Telephone Encounter (Signed)
Spoke with pt, advised Dr [place] is not in office, but another provider ( Dr Salomon Fick) recommends her going to ED. Pt stated she has bled since sending message, I advised if she does she would need to go to ED.

## 2021-07-18 MED ORDER — FLUOXETINE HCL 20 MG PO CAPS: 20.0000 mg | ORAL_CAPSULE | Freq: Every day | ORAL | 1 refills | Status: DC

## 2021-07-19 ENCOUNTER — Other Ambulatory Visit: Payer: Self-pay | Admitting: Endocrinology

## 2021-07-19 DIAGNOSIS — E063 Autoimmune thyroiditis: Secondary | ICD-10-CM

## 2021-07-27 ENCOUNTER — Ambulatory Visit
Admission: RE | Admit: 2021-07-27 | Discharge: 2021-07-27 | Disposition: A | Discharge status: Home / Self Care | Payer: BC Managed Care – PPO | Source: Ambulatory Visit | Attending: Endocrinology | Admitting: Endocrinology

## 2021-07-27 DIAGNOSIS — E041 Nontoxic single thyroid nodule: Secondary | ICD-10-CM | POA: Diagnosis not present

## 2021-07-27 DIAGNOSIS — E063 Autoimmune thyroiditis: Secondary | ICD-10-CM

## 2021-08-02 ENCOUNTER — Encounter: Payer: BC Managed Care – PPO | Attending: Family Medicine | Admitting: Registered"

## 2021-08-02 DIAGNOSIS — E669 Obesity, unspecified: Secondary | ICD-10-CM | POA: Insufficient documentation

## 2021-09-22 ENCOUNTER — Other Ambulatory Visit: Payer: Self-pay | Admitting: Endocrinology

## 2021-09-22 DIAGNOSIS — R599 Enlarged lymph nodes, unspecified: Secondary | ICD-10-CM

## 2021-10-02 ENCOUNTER — Encounter: Payer: Self-pay | Admitting: Family Medicine

## 2021-10-03 ENCOUNTER — Other Ambulatory Visit: Payer: Self-pay

## 2021-10-04 ENCOUNTER — Ambulatory Visit: Payer: BC Managed Care – PPO | Admitting: Family Medicine

## 2021-10-04 ENCOUNTER — Encounter: Payer: Self-pay | Admitting: Family Medicine

## 2021-10-04 VITALS — BP 120/70 | HR 81 | Temp 98.3°F | Resp 16 | Ht 67.0 in | Wt 277.0 lb

## 2021-10-04 DIAGNOSIS — R110 Nausea: Secondary | ICD-10-CM

## 2021-10-04 DIAGNOSIS — R519 Headache, unspecified: Secondary | ICD-10-CM

## 2021-10-04 DIAGNOSIS — F331 Major depressive disorder, recurrent, moderate: Secondary | ICD-10-CM | POA: Diagnosis not present

## 2021-10-04 MED ORDER — TOPIRAMATE 50 MG PO TABS: 50.0000 mg | ORAL_TABLET | Freq: Every day | ORAL | 1 refills | Status: DC

## 2021-10-04 NOTE — Patient Instructions (Addendum)
A few things to remember from today's visit:   Headache, unspecified headache type - Plan: topiramate (TOPAMAX) 50 MG tablet  If you need refills please call your pharmacy. Do not use My Chart to request refills or for acute issues that need immediate attention.   Start a headache diary. Topamax 50 mg to start 1/2 tab and in 5-7 days increase to whole tab if well tolerated.  You may need to decrease dose of Saxenda if it is causing nausea and increase back if better tolerated.  Please be sure medication list is accurate. If a new problem present, please set up appointment sooner than planned today.

## 2021-10-04 NOTE — Progress Notes (Signed)
ACUTE VISIT Chief Complaint  Patient presents with   Headache   HPI: [name] is a 30 y.o. female, who is here today complaining of headache. She was last seen on 03/04/21.  Headaches are more frequent. She has had headache for 4 days. Headache  This is a chronic problem. The pain quality is similar to prior headaches. The pain is moderate. Pertinent negatives include no abnormal behavior, back pain, blurred vision, dizziness, eye redness, eye watering, facial sweating, fever, hearing loss, insomnia, loss of balance, muscle aches, neck pain, numbness, phonophobia, photophobia, rhinorrhea or weakness. Her past medical history is significant for migraine headaches, migraines in the family and obesity. There is no history of cancer, cluster headaches, hypertension, immunosuppression, recent head traumas, sinus disease or TMJ.   Alternating sharp and pounding parietal headache, intermittently. Headache is not proceeded by visual aura  No associated symptoms. She has not identified exacerbating or alleviating factors.  She has taken Excedrin tension extra straight helps some.  Amitriptyline was helping with headaches but "very druggy" the next day, even with 1/2 tab, stopped it last week. She was still having headaches daily, not as intense. Evaluated by neurologist, Dr Marjory Lies, 11/04/2014.  Brain MRI 11/11/2014:Normal. Topamax was recommended.She does not remember if she has any side effect or for how long she took it.  Her mother with hx of migraines.  She is on saxenda, prescribed by her endocrinologist and has helped some with appetite but she still has periods when she eats more due to stress. Medication is causing nausea, she is taking Zofran. Negative for associated abdominal pain,vomiting,or changes in bowel habits. She has been on wt loss medication for 3-4 months.  She is also on Metformin XR 500 mg daily. . She is planning on starting exercising, going back to  the gyn with her mother in law.  Depression and anxiety: Taking Fluoxetine 20 mg am, helping. CBT weekly. Her father moved out and this has helped.  Review of Systems  Constitutional:  Negative for activity change, appetite change, chills and fever.  HENT:  Negative for hearing loss and rhinorrhea.   Eyes:  Negative for blurred vision, photophobia and redness.  Endocrine: Negative for cold intolerance and heat intolerance.  Musculoskeletal:  Negative for back pain and neck pain.  Skin:  Negative for pallor and rash.  Allergic/Immunologic: Positive for environmental allergies.  Neurological:  Positive for headaches. Negative for dizziness, syncope, weakness, numbness and loss of balance.  Hematological:  Negative for adenopathy. Does not bruise/bleed easily.  Psychiatric/Behavioral:  Negative for confusion. The patient is nervous/anxious. The patient does not have insomnia.   Rest see pertinent positives and negatives per HPI.  Current Outpatient Medications on File Prior to Visit  Medication Sig Dispense Refill   FLUoxetine (PROZAC) 20 MG capsule Take 1 capsule (20 mg total) by mouth daily. 30 capsule 1   fluticasone (FLONASE) 50 MCG/ACT nasal spray Place 1 spray into both nostrils 2 (two) times daily. 16 g 2   levonorgestrel (MIRENA) 20 MCG/24HR IUD 1 each by Intrauterine route once.     levothyroxine (SYNTHROID) 300 MCG tablet Take 1 tablet (300 mcg total) by mouth daily before breakfast. 30 tablet 1   Liraglutide -Weight Management (SAXENDA [place]) Inject into the skin.     metFORMIN (GLUCOPHAGE-XR) 500 MG 24 hr tablet Take 1 tablet (500 mg total) by mouth daily with breakfast. 30 tablet 0   ondansetron (ZOFRAN) 8 MG tablet Take 1 tablet (8 mg total) by mouth daily  as needed for nausea. 15 tablet 0   No current facility-administered medications on file prior to visit.   Past Medical History:  Diagnosis Date   ACL tear 05/12/2014   injured on trampoline   Headache(784.0)    stress    Polycystic ovarian syndrome    no current med.   Thyroid disease    No Known Allergies  Social History   Socioeconomic History   Marital status: Married    Spouse name: Not on file   Number of children: 0   Years of education: College/RN   Highest education level: Not on file  Occupational History    Employer: [employer]    Comment: ED-RN  Tobacco Use   Smoking status: Never   Smokeless tobacco: Never  Vaping Use   Vaping Use: Never used  Substance and Sexual Activity   Alcohol use: No    Alcohol/week: 0.0 standard drinks   Drug use: No   Sexual activity: Not on file  Other Topics Concern   Not on file  Social History Narrative   Patient lives at home with family.   Caffeine Use:1-2 cups   Social Determinants of Health   Financial Resource Strain: Not on file  Food Insecurity: Food Insecurity Present   Worried About Programme researcher, broadcasting/film/video in the Last Year: Sometimes true   Ran Out of Food in the Last Year: Never true  Transportation Needs: Not on file  Physical Activity: Not on file  Stress: Not on file  Social Connections: Not on file   Vitals:   10/04/21 0740  BP: 120/70  Pulse: 81  Resp: 16  Temp: 98.3 F (36.8 C)  SpO2: 98%   Wt Readings from Last 3 Encounters:  10/04/21 277 lb (125.6 kg)  03/04/21 270 lb 8 oz (122.7 kg)  01/26/21 248 lb (112.5 kg)   Body mass index is 43.38 kg/m.  Physical Exam Vitals and nursing note reviewed.  Constitutional:      General: She is not in acute distress.    Appearance: She is well-developed.  HENT:     Head: Normocephalic and atraumatic.     Mouth/Throat:     Mouth: Mucous membranes are moist.     Pharynx: Oropharynx is clear.  Eyes:     Conjunctiva/sclera: Conjunctivae normal.  Cardiovascular:     Rate and Rhythm: Normal rate and regular rhythm.     Pulses:          Dorsalis pedis pulses are 2+ on the right side and 2+ on the left side.     Heart sounds: No murmur heard. Pulmonary:     Effort:  Pulmonary effort is normal. No respiratory distress.     Breath sounds: Normal breath sounds.  Abdominal:     Palpations: Abdomen is soft. There is no hepatomegaly or mass.     Tenderness: There is no abdominal tenderness.  Musculoskeletal:     Cervical back: No tenderness or bony tenderness. Normal range of motion.  Lymphadenopathy:     Cervical: No cervical adenopathy.  Skin:    General: Skin is warm.     Findings: No erythema or rash.  Neurological:     General: No focal deficit present.     Mental Status: She is alert and oriented to person, place, and time.     Cranial Nerves: No cranial nerve deficit.     Gait: Gait normal.  Psychiatric:     Comments: Well groomed, good eye contact.  ASSESSMENT AND PLAN:  [name] was seen today for headache.  Diagnoses and all orders for this visit:  Headache, unspecified headache type We discussed possible etiologies. ? Tension headache. Amitriptyline helped but caused drowsiness. After discussed of some side effects she agrees with trying Topamax 50 mg, recommend starting with 1/2 tab and increased to whole tab in 5-7 days of well tolerated.  I do not think imaging is needed at this time. Instructed about warning signs.  -     topiramate (TOPAMAX) 50 MG tablet; Take 1 tablet (50 mg total) by mouth at bedtime.  Nausea without vomiting It seems to be caused by Saxenda, recommend decreasing dose. Follow with Saxenda prescriber and discussed other options if nausea doe snot resolved.  Morbid obesity (HCC) We discussed benefits of wt loss. Consistency with healthy diet and physical activity encouraged.  Depression, major, recurrent, moderate (HCC) Problem has improved. Continue Fluoxetine 20 mg daily and CBT.  Return in about 4 weeks (around 11/01/2021) for Headache..   Juwan Vences G. [place], MD  [facility]. [place] office.

## 2021-10-24 ENCOUNTER — Ambulatory Visit
Admission: RE | Admit: 2021-10-24 | Discharge: 2021-10-24 | Disposition: A | Discharge status: Home / Self Care | Payer: BC Managed Care – PPO | Source: Ambulatory Visit | Attending: Endocrinology | Admitting: Endocrinology

## 2021-10-24 DIAGNOSIS — E041 Nontoxic single thyroid nodule: Secondary | ICD-10-CM | POA: Diagnosis not present

## 2021-10-24 DIAGNOSIS — R599 Enlarged lymph nodes, unspecified: Secondary | ICD-10-CM

## 2021-10-24 DIAGNOSIS — R590 Localized enlarged lymph nodes: Secondary | ICD-10-CM | POA: Diagnosis not present

## 2021-10-24 MED ORDER — IOPAMIDOL (ISOVUE-300) INJECTION 61%
75.0000 mL | Freq: Once | INTRAVENOUS | Status: AC | PRN
  Administered 2021-10-24: 75 mL via INTRAVENOUS

## 2021-10-31 NOTE — Progress Notes (Signed)
Chief Complaint  Patient presents with   Follow-up    Medication is helping some, but she has still had some headaches. She can tell a little bit of difference; has a diary of the headaches.   HPI: Whitney Aguirre is a 30 y.o. female, who is here today for 4 week follow up on headaches. She was last seen on 10/04/21, when she was c/o more frequent headaches. Alternating sharp and pounding parietal headache, intermittently. Headache is not proceeded by visual aura  No associated symptoms. She has not identified exacerbating or alleviating factors.   She has taken Excedrin tension extra straight helps some.  Amitriptyline was helping but caused daytime drowsiness.  Brain MRI in 10/2014 ordered by neurologist due to headaches, nausea, dizziness was normal.  She was started on Topiramate 50 mg daily, which she has tolerated well. She does not feel like medication has helped. Sleeping about 7 hours. + Increased stress. Her father in law Dx'ed with laryngeal cancer, had some complications after surgery.  She is doing CBT. Anxiety and depression on Fluoxetine 20 mg daily, which is still helping.  Shaking: She has a video showing left hand involuntary movement, tremor. She is not sure for how long she has had problem but seems to be worse for the past few weeks. It happens if she holds a cup with let hand and typing. Also worse with stress and alleviated by rest. No FHx of essential tremor. Mild tingling sensation left fingertips. She has not noted weakness or gait abnormalities.  Hypothyroidism, she follows with endocrinology son reported TSH within normal limits.  Review of Systems  Constitutional:  Positive for fatigue. Negative for appetite change, chills and unexpected weight change.  Eyes:  Negative for photophobia and visual disturbance.  Respiratory:  Negative for cough, shortness of breath and wheezing.   Cardiovascular:  Negative for chest pain and leg swelling.   Gastrointestinal:  Negative for abdominal pain, nausea and vomiting.  Musculoskeletal:  Negative for gait problem and neck stiffness.  Neurological:  Negative for syncope and facial asymmetry.  Hematological:  Negative for adenopathy. Does not bruise/bleed easily.  Psychiatric/Behavioral:  Negative for confusion and sleep disturbance. The patient is nervous/anxious.   Rest see pertinent positives and negatives per HPI.  Current Outpatient Medications on File Prior to Visit  Medication Sig Dispense Refill   FLUoxetine (PROZAC) 20 MG capsule Take 1 capsule (20 mg total) by mouth daily. 30 capsule 1   levonorgestrel (MIRENA) 20 MCG/24HR IUD 1 each by Intrauterine route once.     levothyroxine (SYNTHROID) 137 MCG tablet Take 274 mcg by mouth daily.     Liraglutide -Weight Management (SAXENDA [place]) Inject into the skin.     metFORMIN (GLUCOPHAGE-XR) 500 MG 24 hr tablet Take 1 tablet (500 mg total) by mouth daily with breakfast. 30 tablet 0   ondansetron (ZOFRAN) 8 MG tablet Take 1 tablet (8 mg total) by mouth daily as needed for nausea. 15 tablet 0   No current facility-administered medications on file prior to visit.    Past Medical History:  Diagnosis Date   ACL tear 05/12/2014   injured on trampoline   Headache(784.0)    stress   Polycystic ovarian syndrome    no current med.   Thyroid disease    No Known Allergies  Social History   Socioeconomic History   Marital status: Married    Spouse name: Not on file   Number of children: 0   Years of education: College/RN  Highest education level: Not on file  Occupational History    Employer: [employer]    Comment: ED-RN  Tobacco Use   Smoking status: Never   Smokeless tobacco: Never  Vaping Use   Vaping Use: Never used  Substance and Sexual Activity   Alcohol use: No    Alcohol/week: 0.0 standard drinks   Drug use: No   Sexual activity: Not on file  Other Topics Concern   Not on file  Social History Narrative   Patient  lives at home with family.   Caffeine Use:1-2 cups   Social Determinants of Health   Financial Resource Strain: Not on file  Food Insecurity: Food Insecurity Present   Worried About Programme researcher, broadcasting/film/video in the Last Year: Sometimes true   Ran Out of Food in the Last Year: Never true  Transportation Needs: Not on file  Physical Activity: Not on file  Stress: Not on file  Social Connections: Not on file    Vitals:   11/01/21 0733  BP: 120/70  Pulse: (!) 55  Resp: 16  SpO2: 98%   Body mass index is 43.38 kg/m.  Physical Exam Vitals and nursing note reviewed.  Constitutional:      General: She is not in acute distress.    Appearance: She is well-developed.  HENT:     Head: Normocephalic and atraumatic.     Mouth/Throat:     Mouth: Mucous membranes are moist.     Pharynx: Oropharynx is clear.  Eyes:     Conjunctiva/sclera: Conjunctivae normal.  Cardiovascular:     Rate and Rhythm: Regular rhythm. Bradycardia present.     Heart sounds: No murmur heard. Pulmonary:     Effort: Pulmonary effort is normal. No respiratory distress.     Breath sounds: Normal breath sounds.  Abdominal:     Palpations: Abdomen is soft. There is no hepatomegaly or mass.     Tenderness: There is no abdominal tenderness.  Lymphadenopathy:     Cervical: No cervical adenopathy.  Skin:    General: Skin is warm.     Findings: No erythema or rash.  Neurological:     General: No focal deficit present.     Mental Status: She is alert and oriented to person, place, and time.     Cranial Nerves: No cranial nerve deficit.     Motor: Tremor present. No pronator drift.     Coordination: Coordination normal.     Gait: Gait normal.     Deep Tendon Reflexes:     Reflex Scores:      Bicep reflexes are 2+ on the right side and 2+ on the left side.      Patellar reflexes are 2+ on the right side and 2+ on the left side. Psychiatric:        Mood and Affect: Mood is anxious.     Comments: Well groomed, good  eye contact.   ASSESSMENT AND PLAN:  Whitney Aguirre was seen today for follow-up.  Diagnoses and all orders for this visit:  Headache, unspecified headache type I still think it is tension like headache. Amitriptyline helped but not well tolerated. She has not noted significant benefit from Topiramate and may be causing the left hand tingling sensation; so discontinue it. She agrees with trying Nortriptyline 25 mg at bedtime. We discussed some side effects, it may be better tolerated than amitriptyline with similar benefit. Also neurology referral was placed. I do not think brain imaging needs to be repeated, she  was clearly instructed about warning signs.  In 3-4 weeks she will lest me know if Nortriptyline has made a difference, before if any side effect.  -     nortriptyline (PAMELOR) 25 MG capsule; Take 1 capsule (25 mg total) by mouth at bedtime.  Tremor We discussed possible etiologies. History and examination today do not suggest a serious process. Topiramate could be aggravating problem. Reporting thyroid disease well controlled. Neuro referral placed.  Anxiety disorder, unspecified type Could be aggravating some of above problems. Fluoxetine 20 mg has helped, so no changes. Continue CBT. We discussed risk of med interaction between nortriptyline and fluoxetine.  I spent a total of 41 minutes in both face to face and non face to face activities for this visit on the date of this encounter. During this time history was obtained and documented, examination was performed, prior labs/imaging reviewed, and assessment/plan discussed.  Return in about 3 months (around 01/30/2022) for If has not established with neuro.  Maurianna Benard G. [place], MD  [facility]. [place] office.

## 2021-11-01 ENCOUNTER — Ambulatory Visit: Payer: BC Managed Care – PPO | Admitting: Family Medicine

## 2021-11-01 ENCOUNTER — Encounter: Payer: Self-pay | Admitting: Neurology

## 2021-11-01 ENCOUNTER — Encounter: Payer: Self-pay | Admitting: Family Medicine

## 2021-11-01 VITALS — BP 120/70 | HR 55 | Resp 16 | Ht 67.0 in | Wt 277.0 lb

## 2021-11-01 DIAGNOSIS — F419 Anxiety disorder, unspecified: Secondary | ICD-10-CM | POA: Diagnosis not present

## 2021-11-01 DIAGNOSIS — R251 Tremor, unspecified: Secondary | ICD-10-CM | POA: Diagnosis not present

## 2021-11-01 DIAGNOSIS — R519 Headache, unspecified: Secondary | ICD-10-CM

## 2021-11-01 MED ORDER — NORTRIPTYLINE HCL 25 MG PO CAPS: 25.0000 mg | ORAL_CAPSULE | Freq: Every day | ORAL | 1 refills | Status: DC

## 2021-11-01 NOTE — Patient Instructions (Addendum)
A few things to remember from today's visit:  Headache, unspecified headache type - Plan: nortriptyline (PAMELOR) 25 MG capsule, Ambulatory referral to Neurology  Tremor  If you need refills please call your pharmacy. Do not use My Chart to request refills or for acute issues that need immediate attention.   Stop Topamax. Start Nortriptyline 25 mg at bedtime. In 4 weeks let me know if new medication has helped.  No changes in rest. Appt with neuro will be arranged.  Please be sure medication list is accurate. If a new problem present, please set up appointment sooner than planned today.

## 2021-12-06 ENCOUNTER — Encounter: Payer: Self-pay | Admitting: Family Medicine

## 2021-12-11 ENCOUNTER — Encounter: Payer: Self-pay | Admitting: Family Medicine

## 2021-12-12 MED ORDER — NORTRIPTYLINE HCL 50 MG PO CAPS: 50.0000 mg | ORAL_CAPSULE | Freq: Every day | ORAL | 3 refills | Status: DC

## 2022-01-26 NOTE — Progress Notes (Deleted)
? ?NEUROLOGY CONSULTATION NOTE ? ?Sharvi Darleen Crocker ?MRN: [id number] ?DOB: 06-14-1991 ? ?Referring provider: Betty [place], MD ?Primary care provider: Betty [place], MD ? ?Reason for consult:  headache ? ?Assessment/Plan:  ? ?*** ? ? ?Subjective:  ?Whitney Aguirre is a 31 year old female with Hashimoto's thyroiditis who presents for headache and tremor.  History supplemented by referring provider's notes. ? ?Onset:  *** ?Location:  *** ?Quality:  *** ?Intensity:  ***.  *** denies new headache, thunderclap headache or severe headache that wakes *** from sleep. ?Aura:  *** ?Prodrome:  *** ?Postdrome:  *** ?Associated symptoms:  ***.  *** denies associated unilateral numbness or weakness. ?Duration:  *** ?Frequency:  *** ?Frequency of abortive medication: *** ?Triggers:  *** ?Relieving factors:  *** ?Activity:  *** ? ?***.  She has acquired hypothyroidism due to treatment of Hashimoto's.  TSH ***.  She does endorse anxiety. ? ?She previously saw neurology in 2015 for headache.  MRI of brain with and without contrast on 11/11/2014 was normal. ? ?Current NSAIDS/analgesics:  *** ?Current triptans:  none ?Current ergotamine:  none ?Current anti-emetic:  Zofran 8mg  ?Current muscle relaxants:  none ?Current Antihypertensive medications:  none ?Current Antidepressant medications:  nortriptyline 25mg  at bedtime, fluoxetine 20mg  daily ?Current Anticonvulsant medications:  none ?Current anti-CGRP:  none ?Current Vitamins/Herbal/Supplements:  none ?Current Antihistamines/Decongestants:  none ?Other therapy:  *** ?Hormone/birth control:  Mirena ?Other medications:  Levothyroxine, Saxenda, metformin ? ?Past NSAIDS/analgesics:  *** ?Past abortive triptans:  *** ?Past abortive ergotamine:  *** ?Past muscle relaxants:  *** ?Past anti-emetic:  *** ?Past antihypertensive medications:  *** ?Past antidepressant medications:  amitriptyline (drowsiness) ?Past anticonvulsant medications:  topiramate 50mg  BID ?Past anti-CGRP:  *** ?Past  vitamins/Herbal/Supplements:  *** ?Past antihistamines/decongestants:  Flonase ?Other past therapies:  *** ? ?Caffeine:  *** ?Alcohol:  *** ?Smoker:  *** ?Diet:  *** ?Exercise:  *** ?Depression:  ***; Anxiety:  *** ?Other pain:  *** ?Sleep hygiene:  *** ?Family history of headache:  *** ? ?  ? ? ?PAST MEDICAL HISTORY: ?Past Medical History:  ?Diagnosis Date  ? ACL tear 05/12/2014  ? injured on trampoline  ? Headache(784.0)   ? stress  ? Polycystic ovarian syndrome   ? no current med.  ? Thyroid disease   ? ? ?PAST SURGICAL HISTORY: ?Past Surgical History:  ?Procedure Laterality Date  ? KNEE ARTHROSCOPY WITH ANTERIOR CRUCIATE LIGAMENT (ACL) REPAIR Left 06/05/2014  ? Procedure: LEFT KNEE ARTHROSCOPY WITH ANTERIOR CRUCIATE LIGAMENT (ACL) REPAIR;  Surgeon: 11-11-1992, MD;  Location:  [facility];  Service: Orthopedics;  Laterality: Left;  ? WISDOM TOOTH EXTRACTION    ? ? ?MEDICATIONS: ?Current Outpatient Medications on File Prior to Visit  ?Medication Sig Dispense Refill  ? FLUoxetine (PROZAC) 20 MG capsule Take 1 capsule (20 mg total) by mouth daily. 30 capsule 1  ? levonorgestrel (MIRENA) 20 MCG/24HR IUD 1 each by Intrauterine route once.    ? levothyroxine (SYNTHROID) 137 MCG tablet Take 274 mcg by mouth daily.    ? Liraglutide -Weight Management (SAXENDA [place]) Inject into the skin.    ? metFORMIN (GLUCOPHAGE-XR) 500 MG 24 hr tablet Take 1 tablet (500 mg total) by mouth daily with breakfast. 30 tablet 0  ? nortriptyline (PAMELOR) 50 MG capsule Take 1 capsule (50 mg total) by mouth at bedtime. 30 capsule 3  ? ondansetron (ZOFRAN) 8 MG tablet Take 1 tablet (8 mg total) by mouth daily as needed for nausea. 15 tablet 0  ? ?No current facility-administered  medications on file prior to visit.  ? ? ?ALLERGIES: ?No Known Allergies ? ?FAMILY HISTORY: ?Family History  ?Problem Relation Age of Onset  ? Cardiomyopathy Mother   ? High blood pressure Mother   ? Emphysema Mother   ? Ulcers Father   ? Cancer Paternal  Grandfather   ? Diabetes Other   ? Hypertension Other   ? ? ?Objective:  ?*** ?General: No acute distress.  Patient appears well-groomed.   ?Head:  Normocephalic/atraumatic ?Eyes:  fundi examined but not visualized ?Neck: supple, no paraspinal tenderness, full range of motion ?Back: No paraspinal tenderness ?Heart: regular rate and rhythm ?Lungs: Clear to auscultation bilaterally. ?Vascular: No carotid bruits. ?Neurological Exam: ?Mental status: alert and oriented to person, place, and time, recent and remote memory intact, fund of knowledge intact, attention and concentration intact, speech fluent and not dysarthric, language intact. ?Cranial nerves: ?CN I: not tested ?CN II: pupils equal, round and reactive to light, visual fields intact ?CN III, IV, VI:  full range of motion, no nystagmus, no ptosis ?CN V: facial sensation intact. ?CN VII: upper and lower face symmetric ?CN VIII: hearing intact ?CN IX, X: gag intact, uvula midline ?CN XI: sternocleidomastoid and trapezius muscles intact ?CN XII: tongue midline ?Bulk & Tone: normal, no fasciculations. ?Motor:  muscle strength 5/5 throughout ?Sensation:  Pinprick, temperature and vibratory sensation intact. ?Deep Tendon Reflexes:  2+ throughout,  toes downgoing.   ?Finger to nose testing:  Without dysmetria.   ?Heel to shin:  Without dysmetria.   ?Gait:  Normal station and stride.  Romberg negative. ? ? ? ?Thank you for allowing me to take part in the care of this patient. ? ?Shon Millet, DO ? ?CC: *** ? ? ? ? ?

## 2022-01-27 ENCOUNTER — Ambulatory Visit: Payer: BC Managed Care – PPO | Admitting: Neurology

## 2022-01-30 NOTE — Progress Notes (Signed)
? ?Virtual Visit via Video Note ?The purpose of this virtual visit is to provide medical care while limiting exposure to the novel coronavirus.   ? ?Consent was obtained for video visit:  Yes.   ?Answered questions that patient had about telehealth interaction:  Yes.   ?I discussed the limitations, risks, security and privacy concerns of performing an evaluation and management service by telemedicine. I also discussed with the patient that there may be a patient responsible charge related to this service. The patient expressed understanding and agreed to proceed. ? ?Pt location: Home ?Physician Location: office ?Name of referring provider:  [place], Betty G, MD ?I connected with Meta Hatchet at patients initiation/request on 01/31/2022 at  7:50 AM EST by video enabled telemedicine application and verified that I am speaking with the correct person using two identifiers. ?Pt MRN:  [id number] ?Pt DOB:  1991/02/05 ?Video Participants:  Stephanine Gennesis Hogland ? ?Assessment and Plan:   ?Tension-type headache, not intractable ? Task-specific dystonia ? ? ?1  To further reduce headache frequency, increase nortriptyline to 75mg  at bedtime.   ?2  Limit use of pain relievers to no more than 2 days out of week to prevent risk of rebound or medication-overuse headache. ?3  Keep headache diary ?4  Monitor hand cramps for now.   ?5  Follow up 4 months. ? ?History of Present Illness:  ?Whitney Aguirre is a 31 year old right-handed female with Hashimoto's thyroiditis and PCOS who presents for headache and tremor.  History supplemented by referring provider's note. ? ?Onset:  headaches since she was 31 years old. ?Location:  usually forehead, top or back of head at base of neck ?Quality:  pressure vs throbbing vs pounding vs stabbling ?Intensity:  5-6/10.  She denies new headache, thunderclap headache or severe headache that wakes her from sleep. ?Aura:  absent ?Prodrome:  absent ?Associated symptoms:  No photophobia, phonophobia.   Rarely nausea.  She denies associated vomiting, visual disturbance, unilateral numbness or weakness. ?Duration:  Usually 30 to 60 minutes but may last up to 5-6 hours ?Frequency:  daily until starting nortriptyline.  Started nortriptyline on 11/03/2021.  Now approximately 1 to 2 days a week.   ?Frequency of abortive medication: 1 to 2 days a week. ?Triggers:  stress ?Relieving factors:  Excedrin ?Activity:  able to function ? ?She started noticing a tremor in her hands a few months ago.  Happens at work when she is using her hands, such as at the computer or writing.  She feels the tremor start in hand and she needs to stop and rest until it passes.  Thumb starts to twitch and intrinsic hand muscles spasm.  Hand is in a flexed position.  Happens in both hands but mostly the left hand.  Lasts 1 to 4 minutes.  Needs to stop and let it pass.  No pain, weakness or numbness.  Occurs 1 to 2 times a week.  Denies family history of tremor.  Thyroid has been controlled.   ? ?Current NSAIDS/analgesics:  Excedrin Migrane/Tension ?Current triptans:  none ?Current ergotamine:  none ?Current anti-emetic:  Zofran 8mg  ?Current muscle relaxants:  none ?Current Antihypertensive medications:  noe ?Current Antidepressant medications:  nortriptyline 50mg  at bedtime, fluoxetine 20mg  daily ?Current Anticonvulsant medications:  none ?Current anti-CGRP:  none ?Current Vitamins/Herbal/Supplements:  none ?Current Antihistamines/Decongestants:  none ?Other therapy:  none ?Hormone/birth control:  Mirena ?Other medications:  levothyroxine, Saxenda, metformin ? ?Past NSAIDS/analgesics:  ibuprofen, naproxen, acetaminophen ?Past abortive triptans:  none ?Past  abortive ergotamine:  none ?Past muscle relaxants:  none ?Past anti-emetic:  none ?Past antihypertensive medications:  none ?Past antidepressant medications:  amitriptyline ?Past anticonvulsant medications:  topiramate ?Past anti-CGRP:  none ?Past vitamins/Herbal/Supplements:  none ?Past  antihistamines/decongestants:  Flonase, Sudafed ?Other past therapies:  none ? ?Caffeine:  1 20 oz cup coffee daily.  Sometimes soda ?Alcohol:  occasional ?Smoker:  no ?Diet:  48 to 64 oz water daily.  Does not skip meals ?Exercise:  not routine ?Depression/Anxiety:  father was ill and she was primary caregiver.  Father passed away this past weekend. ?Other pain:  no ?Sleep hygiene:  poor.  Nortriptyline not helping with sleeping. ?Family history of headache:  mom (severe migraines) ? ? ?Past Medical History: ?Past Medical History:  ?Diagnosis Date  ? ACL tear 05/12/2014  ? injured on trampoline  ? Headache(784.0)   ? stress  ? Polycystic ovarian syndrome   ? no current med.  ? Thyroid disease   ? ? ?Medications: ?Outpatient Encounter Medications as of 01/31/2022  ?Medication Sig  ? FLUoxetine (PROZAC) 20 MG capsule Take 1 capsule (20 mg total) by mouth daily.  ? levonorgestrel (MIRENA) 20 MCG/24HR IUD 1 each by Intrauterine route once.  ? levothyroxine (SYNTHROID) 137 MCG tablet Take 274 mcg by mouth daily.  ? Liraglutide -Weight Management (SAXENDA [place]) Inject into the skin.  ? metFORMIN (GLUCOPHAGE-XR) 500 MG 24 hr tablet Take 1 tablet (500 mg total) by mouth daily with breakfast.  ? nortriptyline (PAMELOR) 50 MG capsule Take 1 capsule (50 mg total) by mouth at bedtime.  ? ondansetron (ZOFRAN) 8 MG tablet Take 1 tablet (8 mg total) by mouth daily as needed for nausea.  ? ?No facility-administered encounter medications on file as of 01/31/2022.  ? ? ?Allergies: ?No Known Allergies ? ?Family History: ?Family History  ?Problem Relation Age of Onset  ? Cardiomyopathy Mother   ? High blood pressure Mother   ? Emphysema Mother   ? Ulcers Father   ? Cancer Paternal Grandfather   ? Diabetes Other   ? Hypertension Other   ? ? ?Observations/Objective:   ?No acute distress.  Alert and oriented.  Speech fluent and not dysarthric.  Language intact.  Eyes orthophoric on primary gaze.  Face symmetric. ? ? ?Follow Up Instructions: ?   ? -I discussed the assessment and treatment plan with the patient. The patient was provided an opportunity to ask questions and all were answered. The patient agreed with the plan and demonstrated an understanding of the instructions. ?  ?The patient was advised to call back or seek an in-person evaluation if the symptoms worsen or if the condition fails to improve as anticipated. ? ? ? ?Cira Servant, DO ? ?

## 2022-01-31 ENCOUNTER — Telehealth (INDEPENDENT_AMBULATORY_CARE_PROVIDER_SITE_OTHER): Payer: BC Managed Care – PPO | Admitting: Neurology

## 2022-01-31 ENCOUNTER — Other Ambulatory Visit: Payer: Self-pay

## 2022-01-31 ENCOUNTER — Encounter: Payer: Self-pay | Admitting: Neurology

## 2022-01-31 VITALS — Ht 67.0 in | Wt 260.0 lb

## 2022-01-31 DIAGNOSIS — G44219 Episodic tension-type headache, not intractable: Secondary | ICD-10-CM | POA: Diagnosis not present

## 2022-01-31 DIAGNOSIS — G249 Dystonia, unspecified: Secondary | ICD-10-CM | POA: Diagnosis not present

## 2022-01-31 MED ORDER — NORTRIPTYLINE HCL 75 MG PO CAPS: 75.0000 mg | ORAL_CAPSULE | Freq: Every day | ORAL | 5 refills | Status: DC

## 2022-01-31 NOTE — Patient Instructions (Signed)
Increase nortriptyline to 75mg  at bedtime.  If no significant improvement in 6 weeks, contact me ?Limit use of pain relievers to no more than 2 days out of week to prevent risk of rebound or medication-overuse headache. ?Keep headache diary ?Follow up 4 months. ?

## 2022-02-06 ENCOUNTER — Ambulatory Visit: Payer: BC Managed Care – PPO | Admitting: Neurology

## 2022-03-14 ENCOUNTER — Encounter: Payer: Self-pay | Admitting: Neurology

## 2022-03-15 ENCOUNTER — Other Ambulatory Visit: Payer: Self-pay

## 2022-03-15 MED ORDER — NORTRIPTYLINE HCL 10 MG PO CAPS: 10.0000 mg | ORAL_CAPSULE | Freq: Every day | ORAL | 3 refills | Status: DC

## 2022-03-15 NOTE — Progress Notes (Signed)
Per Dr.Jaffe, ?In addition to starting a 50mg  pill of nortriptyline, we can add a 10mg  pill of nortriptyline for total of 60mg  at bedtime.  I don't typically do that but we can try.  Alternatively, if manageable with 1 to 2 headaches a week compared to daily headaches, she may want to just continue 50mg  nortriptyline daily and see how she continues to feel  ?

## 2022-03-20 ENCOUNTER — Other Ambulatory Visit: Payer: Self-pay

## 2022-03-20 MED ORDER — NORTRIPTYLINE HCL 50 MG PO CAPS: 50.0000 mg | ORAL_CAPSULE | Freq: Every day | ORAL | 1 refills | Status: DC

## 2022-05-04 ENCOUNTER — Encounter (INDEPENDENT_AMBULATORY_CARE_PROVIDER_SITE_OTHER): Payer: BC Managed Care – PPO | Admitting: Family Medicine

## 2022-05-04 DIAGNOSIS — R002 Palpitations: Secondary | ICD-10-CM

## 2022-05-04 DIAGNOSIS — I493 Ventricular premature depolarization: Secondary | ICD-10-CM | POA: Diagnosis not present

## 2022-05-08 NOTE — Telephone Encounter (Signed)
Please see the MyChart message reply(ies) for my assessment and plan.  The patient gave consent for this Medical Advice Message and is aware that it may result in a bill to their insurance company as well as the possibility that this may result in a co-payment or deductible. They are an established patient, but are not seeking medical advice exclusively about a problem treated during an in person or video visit in the last 7 days. I did not recommend an in person or video visit within 7 days of my reply. 1. PVC's (premature ventricular contractions) - Ambulatory referral to Cardiology  2. Palpitations - Ambulatory referral to Cardiology  EKG's pt sent will be scanned.  I spent a total of 11 minutes cumulative time within 7 days through [employer] Whitney Blancett [place], MD

## 2022-05-25 ENCOUNTER — Ambulatory Visit: Payer: BC Managed Care – PPO | Admitting: Cardiovascular Disease

## 2022-05-25 ENCOUNTER — Encounter: Payer: Self-pay | Admitting: Cardiovascular Disease

## 2022-05-25 VITALS — BP 118/72 | HR 73 | Ht 67.0 in | Wt 263.0 lb

## 2022-05-25 DIAGNOSIS — E039 Hypothyroidism, unspecified: Secondary | ICD-10-CM | POA: Diagnosis not present

## 2022-05-25 DIAGNOSIS — R7303 Prediabetes: Secondary | ICD-10-CM

## 2022-05-25 DIAGNOSIS — Z8249 Family history of ischemic heart disease and other diseases of the circulatory system: Secondary | ICD-10-CM

## 2022-05-25 DIAGNOSIS — E282 Polycystic ovarian syndrome: Secondary | ICD-10-CM

## 2022-05-25 DIAGNOSIS — I493 Ventricular premature depolarization: Secondary | ICD-10-CM | POA: Diagnosis not present

## 2022-05-25 DIAGNOSIS — R002 Palpitations: Secondary | ICD-10-CM

## 2022-05-25 MED ORDER — METOPROLOL TARTRATE 25 MG PO TABS: 12.5000 mg | ORAL_TABLET | Freq: Two times a day (BID) | ORAL | 4 refills | Status: AC | PRN

## 2022-05-25 NOTE — Patient Instructions (Signed)
Medication Instructions:  TAKE Metoprolol Tartrate 12.5 mg (half of the 25 mg) twice daily as needed for palpitations  *If you need a refill on your cardiac medications before your next appointment, please call your pharmacy*   Lab Work: None ordered If you have labs (blood work) drawn today and your tests are completely normal, you will receive your results only by: MyChart Message (if you have MyChart) OR A paper copy in the mail If you have any lab test that is abnormal or we need to change your treatment, we will call you to review the results.   Testing/Procedures: Your physician has requested that you have an echocardiogram. Echocardiography is a painless test that uses sound waves to create images of your heart. It provides your doctor with information about the size and shape of your heart and how well your heart's chambers and valves are working. You may receive an ultrasound enhancing agent through an IV if needed to better visualize your heart during the echo.This procedure takes approximately one hour. There are no restrictions for this procedure. This will take place at the [address], [address].    Follow-Up: At [facility], you and your health needs are our priority.  As part of our continuing mission to provide you with exceptional heart care, we have created designated Provider Care Teams.  These Care Teams include your primary Cardiologist (physician) and Advanced Practice Providers (APPs -  Physician Assistants and Nurse Practitioners) who all work together to provide you with the care you need, when you need it.  We recommend signing up for the patient portal called "MyChart".  Sign up information is provided on this After Visit Summary.  MyChart is used to connect with patients for Virtual Visits (Telemedicine).  Patients are able to view lab/test results, encounter notes, upcoming appointments, etc.  Non-urgent messages can be sent to your provider as well.   To  learn more about what you can do with MyChart, go to [email or web address].    Your next appointment:   12 month(s)  The format for your next appointment:   In Person  Provider:   Thurmon Fair, MD {    Important Information About Sugar

## 2022-05-25 NOTE — Progress Notes (Signed)
Cardiology Office Note:    Date:  05/28/2022   ID:  Whitney Aguirre, DOB 01-01-1991, MRN [id number]  PCP:  [place], Betty G, MD   [facility] Providers Cardiologist:  Thurmon Fair, MD     Referring MD: [place], Betty G, MD   Chief Complaint  Patient presents with   family history cardiomyopathy   Palpitations  Whitney Aguirre is a 31 y.o. female who is being seen today for the evaluation of arrhythmia and family history of hypertrophic cardiomyopathy at the request of [place], Timoteo Expose, MD.   History of Present Illness:    Whitney Aguirre is a 31 y.o. female with a family history of hypertrophic cardiomyopathy (mother has a defibrillator and sees Dr. Graciela Husbands) who has recently been troubled by increasingly frequent PVCs.  She capture these on her smart watch on several occasions including most recently on May 29, which shows 6 monomorphic PVCs on 1/32 strip.  The PVCs occur at rest, without obvious precipitant and are symptomatic ("they take my breath").  Otherwise she does not have exertional dyspnea, orthopnea or PND and denies chest pain, dizziness or syncope.  She does not have lower extremity edema and has not had any focal neurological events.  Whitney Aguirre had echocardiograms performed in 2013 and 2018 which did not show any evidence of left ventricular hypertrophy or other structural abnormalities.  Additional problems include morbid obesity (BMI 41), polycystic ovarian syndrome, treated autoimmune hypothyroidism, impaired glucose tolerance without diabetes, anxiety and major depression.  She has been successful with weight loss on Wegovy, but since this medication was in short supply she has been temporarily taking Saxenda instead.  She is also receiving phentermine.  As far as the patient knows, her mother has never received appropriate treatment for VT from her ICD and has not had cardiac arrest (device implanted for primary prevention).  Past Medical History:  Diagnosis  Date   ACL tear 05/12/2014   injured on trampoline   Headache(784.0)    stress   Polycystic ovarian syndrome    no current med.   Thyroid disease     Past Surgical History:  Procedure Laterality Date   KNEE ARTHROSCOPY WITH ANTERIOR CRUCIATE LIGAMENT (ACL) REPAIR Left 06/05/2014   Procedure: LEFT KNEE ARTHROSCOPY WITH ANTERIOR CRUCIATE LIGAMENT (ACL) REPAIR;  Surgeon: Harvie Junior, MD;  Location: [facility];  Service: Orthopedics;  Laterality: Left;   WISDOM TOOTH EXTRACTION      Current Medications: Current Meds  Medication Sig   FLUoxetine (PROZAC) 20 MG capsule Take 1 capsule (20 mg total) by mouth daily.   levonorgestrel (MIRENA) 20 MCG/24HR IUD 1 each by Intrauterine route once.   levothyroxine (SYNTHROID) 150 MCG tablet Take 150 mcg by mouth daily in the afternoon. 2 tablets Monday- Saturday and 1 tablet on Sunday   metFORMIN (GLUCOPHAGE-XR) 500 MG 24 hr tablet Take 1 tablet (500 mg total) by mouth daily with breakfast.   metoprolol tartrate (LOPRESSOR) 25 MG tablet Take 0.5 tablets (12.5 mg total) by mouth 2 (two) times daily as needed (for palpitations).   nortriptyline (PAMELOR) 10 MG capsule Take 1 capsule (10 mg total) by mouth at bedtime.   nortriptyline (PAMELOR) 50 MG capsule Take 1 capsule (50 mg total) by mouth at bedtime. Take 50 mg in addition to the 10 mg daily   Phentermine HCl (LOMAIRA) 8 MG TABS Take 8 mg by mouth daily before breakfast.   WEGOVY 0.25 MG/0.5ML SOAJ Inject into the skin once a  week.     Allergies:   Patient has no known allergies.   Social History   Socioeconomic History   Marital status: Married    Spouse name: Not on file   Number of children: 0   Years of education: College/RN   Highest education level: Not on file  Occupational History    Employer: [employer]    Comment: ED-RN  Tobacco Use   Smoking status: Never   Smokeless tobacco: Never  Vaping Use   Vaping Use: Never used  Substance and Sexual Activity    Alcohol use: No    Alcohol/week: 0.0 standard drinks of alcohol   Drug use: No   Sexual activity: Not on file  Other Topics Concern   Not on file  Social History Narrative   Patient lives at home with family.   Caffeine Use:1-2 cups   Social Determinants of Health   Financial Resource Strain: Not on file  Food Insecurity: Food Insecurity Present (06/14/2021)   Hunger Vital Sign    Worried About Running Out of Food in the Last Year: Sometimes true    Ran Out of Food in the Last Year: Never true  Transportation Needs: Not on file  Physical Activity: Not on file  Stress: Not on file  Social Connections: Not on file     Family History: The patient's family history includes Cancer in her paternal grandfather; Cardiomyopathy in her mother; Diabetes in an other family member; Emphysema in her mother; High blood pressure in her mother; Hypertension in an other family member; Ulcers in her father.  ROS:   Please see the history of present illness.     All other systems reviewed and are negative.  EKGs/Labs/Other Studies Reviewed:    The following studies were reviewed today: ECHO 2018 - Left ventricle: The cavity size was normal. Wall thickness was    normal. Systolic function was normal. The estimated ejection    fraction was in the range of 60% to 65%. Wall motion was normal;    there were no regional wall motion abnormalities. Left    ventricular diastolic function parameters were normal.  - Mitral valve: Mildly thickened leaflets . There was trivial    regurgitation.  - Left atrium: The atrium was normal in size.  - Tricuspid valve: There was trivial regurgitation.  - Pulmonary arteries: PA peak pressure: 29 mm Hg (S).  - Inferior vena cava: The vessel was normal in size. The    respirophasic diameter changes were in the normal range (>= 50%),    consistent with normal central venous pressure.   Impressions:   - Esssentially normal echo.   EKG:  EKG is ordered today.   The ekg ordered today demonstrates NSR, normal tracing  Recent Labs: No results found for requested labs within last 365 days.  12/20/2021 Hemoglobin A1c 5.5%, TSH 0.24 Recent Lipid Panel No results found for: "CHOL", "TRIG", "HDL", "CHOLHDL", "VLDL", "LDLCALC", "LDLDIRECT" 03/10/2021 HDL 30, LDL 50, triglycerides 296  Risk Assessment/Calculations:           Physical Exam:    VS:  BP 118/72 (BP Location: Left Arm, Patient Position: Sitting, Cuff Size: Large)   Pulse 73   Ht 5\' 7"  (1.702 m)   Wt 263 lb (119.3 kg)   SpO2 96%   BMI 41.19 kg/m     Wt Readings from Last 3 Encounters:  05/25/22 263 lb (119.3 kg)  01/31/22 260 lb (117.9 kg)  11/01/21 277 lb (125.6 kg)  GEN: Obese,  Well nourished, well developed in no acute distress HEENT: Normal NECK: No JVD; No carotid bruits LYMPHATICS: No lymphadenopathy CARDIAC: RRR, no murmurs, rubs, gallops RESPIRATORY:  Clear to auscultation without rales, wheezing or rhonchi  ABDOMEN: Soft, non-tender, non-distended MUSCULOSKELETAL:  No edema; No deformity  SKIN: Warm and dry NEUROLOGIC:  Alert and oriented x 3 PSYCHIATRIC:  Normal affect   ASSESSMENT:    1. PVCs (premature ventricular contractions)   2. Family history of cardiomyopathy   3. Acquired hypothyroidism   4. Prediabetes   5. PCOS (polycystic ovarian syndrome)   6. Morbid obesity (HCC)    PLAN:    In order of problems listed above:  PVCs: Monomorphic PVCs are likely to be benign as long as they occur in a structurally healthy heart.  However, with a family history of hypertrophic cardiomyopathy it is important to make sure that she does not have this disorder.  Repeat echocardiogram.  Try to get more details about her mother's condition.  We will prescribe beta-blockers "as needed" for now.  Continue monitoring via her smart watch.  Avoid stimulants such as caffeine. FHx HCM: Asked her to see if her mother has had or is considering genetic testing.  I think we  should repeat her echocardiogram since she now has symptoms.  Hypothyroidism: Most recent TSH was on the low end of normal.  She may benefit from a slightly lower dose of levothyroxine due to the PVCs.  Otherwise she does not have any overt findings that suggest hyperthyroidism. PreDM: Most recent hemoglobin A1c is actually normal, but metformin is also useful for her PCOS. Obesity: Will soon be restarting Wegovy.  Her weight is 14 pounds lower than it was 6 months ago.           Medication Adjustments/Labs and Tests Ordered: Current medicines are reviewed at length with the patient today.  Concerns regarding medicines are outlined above.  Orders Placed This Encounter  Procedures   EKG 12-Lead   ECHOCARDIOGRAM COMPLETE   Meds ordered this encounter  Medications   metoprolol tartrate (LOPRESSOR) 25 MG tablet    Sig: Take 0.5 tablets (12.5 mg total) by mouth 2 (two) times daily as needed (for palpitations).    Dispense:  30 tablet    Refill:  4    Patient Instructions  Medication Instructions:  TAKE Metoprolol Tartrate 12.5 mg (half of the 25 mg) twice daily as needed for palpitations  *If you need a refill on your cardiac medications before your next appointment, please call your pharmacy*   Lab Work: None ordered If you have labs (blood work) drawn today and your tests are completely normal, you will receive your results only by: MyChart Message (if you have MyChart) OR A paper copy in the mail If you have any lab test that is abnormal or we need to change your treatment, we will call you to review the results.   Testing/Procedures: Your physician has requested that you have an echocardiogram. Echocardiography is a painless test that uses sound waves to create images of your heart. It provides your doctor with information about the size and shape of your heart and how well your heart's chambers and valves are working. You may receive an ultrasound enhancing agent through an  IV if needed to better visualize your heart during the echo.This procedure takes approximately one hour. There are no restrictions for this procedure. This will take place at the [address].  Follow-Up: At [facility], you and your health needs are our priority.  As part of our continuing mission to provide you with exceptional heart care, we have created designated Provider Care Teams.  These Care Teams include your primary Cardiologist (physician) and Advanced Practice Providers (APPs -  Physician Assistants and Nurse Practitioners) who all work together to provide you with the care you need, when you need it.  We recommend signing up for the patient portal called "MyChart".  Sign up information is provided on this After Visit Summary.  MyChart is used to connect with patients for Virtual Visits (Telemedicine).  Patients are able to view lab/test results, encounter notes, upcoming appointments, etc.  Non-urgent messages can be sent to your provider as well.   To learn more about what you can do with MyChart, go to [email or web address].    Your next appointment:   12 month(s)  The format for your next appointment:   In Person  Provider:   Thurmon Fair, MD {    Important Information About Sugar         Signed, Thurmon Fair, MD  05/28/2022 4:44 PM    [facility]

## 2022-05-26 ENCOUNTER — Encounter: Payer: Self-pay | Admitting: Cardiovascular Disease

## 2022-05-28 ENCOUNTER — Encounter: Payer: Self-pay | Admitting: Cardiovascular Disease

## 2022-05-28 DIAGNOSIS — Z8249 Family history of ischemic heart disease and other diseases of the circulatory system: Secondary | ICD-10-CM | POA: Insufficient documentation

## 2022-06-13 ENCOUNTER — Ambulatory Visit (HOSPITAL_COMMUNITY): Payer: BC Managed Care – PPO

## 2022-06-19 NOTE — Progress Notes (Unsigned)
NEUROLOGY FOLLOW UP OFFICE NOTE  Whitney Aguirre [id number]  Assessment/Plan:   Tension-type headache, not intractable      1  Nortriptyline 60mg  at bedtime (one 50mg  and one 10mg  capsule together) 2  Limit use of pain relievers to no more than 2 days out of week to prevent risk of rebound or medication-overuse headache. 3  Keep headache diary 4  Follow up 6 months.  Subjective:  Whitney Aguirre is a 31 year old right-handed female with Hashimoto's thyroiditis and PCOS and task specific dystonia who follows up for headache and task-specific dystonia.  UPDATE: Increased nortriptyline to 75mg .  It made her feel too sluggish, so she was instead changed to 60mg  at bedtime. Intensity:  7/10 Duration:  2 hours with Excedrin Frequency:  1 headache in last 30+ days Frequency of abortive medication: 1 in last 30 days Last 2 headaches were different - it was left sided pressure pain from temple to jaw.  No associated symptoms.  She is not aware if she clenches her teeth.    Current NSAIDS/analgesics:  Excedrin Migrane/Tension Current triptans:  none Current ergotamine:  none Current anti-emetic:  Zofran 8mg  Current muscle relaxants:  none Current Antihypertensive medications:  metoprolol tartrate Current Antidepressant medications:  nortriptyline 60mg  at bedtime, fluoxetine 20mg  daily Current Anticonvulsant medications:  none Current anti-CGRP:  none Current Vitamins/Herbal/Supplements:  none Current Antihistamines/Decongestants:  none Other therapy:  none Hormone/birth control:  Mirena Other medications:  levothyroxine, metformin  Caffeine:  1 20 oz cup coffee daily.  Sometimes soda Alcohol:  occasional Smoker:  no Diet:  48 to 64 oz water daily.  Does not skip meals Exercise:  not routine Depression/Anxiety:  father was ill and she was primary caregiver.  Father passed away this past weekend. Other pain:  no Sleep hygiene:  poor.  Nortriptyline not helping with  sleeping.  HISTORY:  Onset:  headaches since she was 31 years old. Location:  usually forehead, top or back of head at base of neck Quality:  pressure vs throbbing vs pounding vs stabbling Intensity:  5-6/10.  She denies new headache, thunderclap headache or severe headache that wakes her from sleep. Aura:  absent Prodrome:  absent Associated symptoms:  No photophobia, phonophobia.  Rarely nausea.  She denies associated vomiting, visual disturbance, unilateral numbness or weakness. Duration:  Usually 30 to 60 minutes but may last up to 5-6 hours Frequency:  daily until starting nortriptyline.  Started nortriptyline on 11/03/2021.  Now approximately 1 to 2 days a week.   Frequency of abortive medication: 1 to 2 days a week. Triggers:  stress Relieving factors:  Excedrin Activity:  able to function   She started noticing a tremor in her hands a few months ago.  Happens at work when she is using her hands, such as at the computer or writing.  She feels the tremor start in hand and she needs to stop and rest until it passes.  Thumb starts to twitch and intrinsic hand muscles spasm.  Hand is in a flexed position.  Happens in both hands but mostly the left hand.  Lasts 1 to 4 minutes.  Needs to stop and let it pass.  No pain, weakness or numbness.  Occurs 1 to 2 times a week.  Denies family history of tremor.  Thyroid has been controlled.        Past NSAIDS/analgesics:  ibuprofen, naproxen, acetaminophen Past abortive triptans:  none Past abortive ergotamine:  none Past muscle relaxants:  none Past  anti-emetic:  none Past antihypertensive medications:  none Past antidepressant medications:  amitriptyline Past anticonvulsant medications:  topiramate Past anti-CGRP:  none Past vitamins/Herbal/Supplements:  none Past antihistamines/decongestants:  Flonase, Sudafed Other past therapies:  none    Family history of headache:  mom (severe migraines)  PAST MEDICAL HISTORY: Past Medical  History:  Diagnosis Date   ACL tear 05/12/2014   injured on trampoline   Headache(784.0)    stress   Polycystic ovarian syndrome    no current med.   Thyroid disease     MEDICATIONS: Current Outpatient Medications on File Prior to Visit  Medication Sig Dispense Refill   FLUoxetine (PROZAC) 20 MG capsule Take 1 capsule (20 mg total) by mouth daily. 30 capsule 1   levonorgestrel (MIRENA) 20 MCG/24HR IUD 1 each by Intrauterine route once.     levothyroxine (SYNTHROID) 150 MCG tablet Take 150 mcg by mouth daily in the afternoon. 2 tablets Monday- Saturday and 1 tablet on Sunday     metFORMIN (GLUCOPHAGE-XR) 500 MG 24 hr tablet Take 1 tablet (500 mg total) by mouth daily with breakfast. 30 tablet 0   metoprolol tartrate (LOPRESSOR) 25 MG tablet Take 0.5 tablets (12.5 mg total) by mouth 2 (two) times daily as needed (for palpitations). 30 tablet 4   nortriptyline (PAMELOR) 10 MG capsule Take 1 capsule (10 mg total) by mouth at bedtime. 60 capsule 3   nortriptyline (PAMELOR) 50 MG capsule Take 1 capsule (50 mg total) by mouth at bedtime. Take 50 mg in addition to the 10 mg daily 30 capsule 1   ondansetron (ZOFRAN) 8 MG tablet Take 1 tablet (8 mg total) by mouth daily as needed for nausea. (Patient not taking: Reported on 05/25/2022) 15 tablet 0   Phentermine HCl (LOMAIRA) 8 MG TABS Take 8 mg by mouth daily before breakfast.     WEGOVY 0.25 MG/0.5ML SOAJ Inject into the skin once a week.     No current facility-administered medications on file prior to visit.    ALLERGIES: No Known Allergies  FAMILY HISTORY: Family History  Problem Relation Age of Onset   Cardiomyopathy Mother    High blood pressure Mother    Emphysema Mother    Ulcers Father    Cancer Paternal Grandfather    Diabetes Other    Hypertension Other       Objective:  Blood pressure 133/72, pulse 70, height 5\' 7"  (1.702 m), weight 272 lb 12.8 oz (123.7 kg), SpO2 98 %. General: No acute distress.  Patient appears  well-groomed.   Head:  Normocephalic/atraumatic Eyes:  Fundi examined but not visualized Neck: supple, no paraspinal tenderness, full range of motion Heart:  Regular rate and rhythm Lungs:  Clear to auscultation bilaterally Back: No paraspinal tenderness Neurological Exam: alert and oriented to person, place, and time.  Speech fluent and not dysarthric, language intact.  CN II-XII intact. Bulk and tone normal, muscle strength 5/5 throughout.  Sensation to light touch intact.  Deep tendon reflexes 2+ throughout  Finger to nose testing intact.  Gait normal, Romberg negative.   , DO  CC: Betty Shon Millet, MD

## 2022-06-20 ENCOUNTER — Ambulatory Visit: Payer: BC Managed Care – PPO | Admitting: Neurology

## 2022-06-20 ENCOUNTER — Encounter: Payer: Self-pay | Admitting: Neurology

## 2022-06-20 VITALS — BP 133/72 | HR 70 | Ht 67.0 in | Wt 272.8 lb

## 2022-06-20 DIAGNOSIS — G44219 Episodic tension-type headache, not intractable: Secondary | ICD-10-CM

## 2022-06-20 MED ORDER — NORTRIPTYLINE HCL 10 MG PO CAPS
10.0000 mg | ORAL_CAPSULE | Freq: Every day | ORAL | 5 refills | Status: DC
Start: 2022-06-20 — End: 2023-06-29

## 2022-06-20 MED ORDER — NORTRIPTYLINE HCL 50 MG PO CAPS: 50.0000 mg | ORAL_CAPSULE | Freq: Every day | ORAL | 5 refills | Status: DC

## 2022-06-29 ENCOUNTER — Ambulatory Visit (HOSPITAL_COMMUNITY): Payer: BC Managed Care – PPO | Attending: Cardiology

## 2022-06-29 DIAGNOSIS — R079 Chest pain, unspecified: Secondary | ICD-10-CM | POA: Insufficient documentation

## 2022-06-29 DIAGNOSIS — Z8249 Family history of ischemic heart disease and other diseases of the circulatory system: Secondary | ICD-10-CM | POA: Insufficient documentation

## 2022-06-29 DIAGNOSIS — R002 Palpitations: Secondary | ICD-10-CM | POA: Diagnosis not present

## 2022-06-29 LAB — ECHOCARDIOGRAM COMPLETE
Area-P 1/2: 3.13 cm2
S' Lateral: 2.6 cm

## 2022-07-11 IMAGING — US US THYROID
1 series · 13 of 25 positions shown · non-contrast
Comparison: 02/11/2017

CLINICAL DATA: Goiter.

EXAM:
THYROID ULTRASOUND
TECHNIQUE: Ultrasound examination of the thyroid gland and adjacent soft
tissues was performed.

[Series 1: us thyroid · 0.08mm/px · 53 acquisitions, 13 frames shown]
[im 1/53]
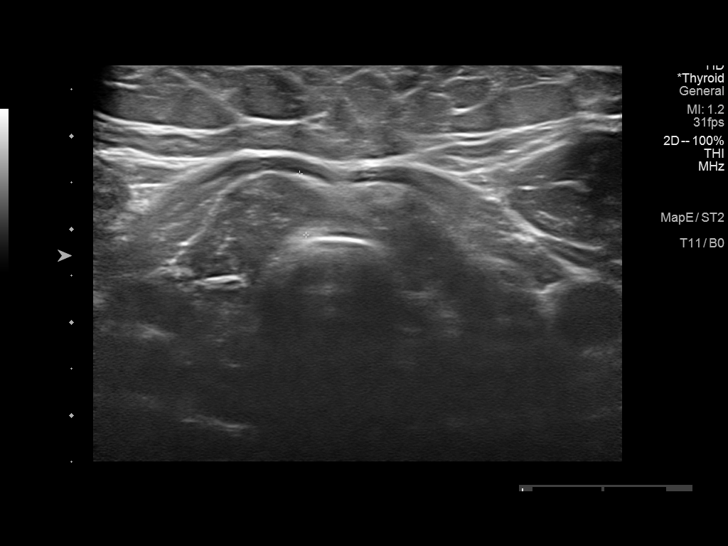
[im 5/53]
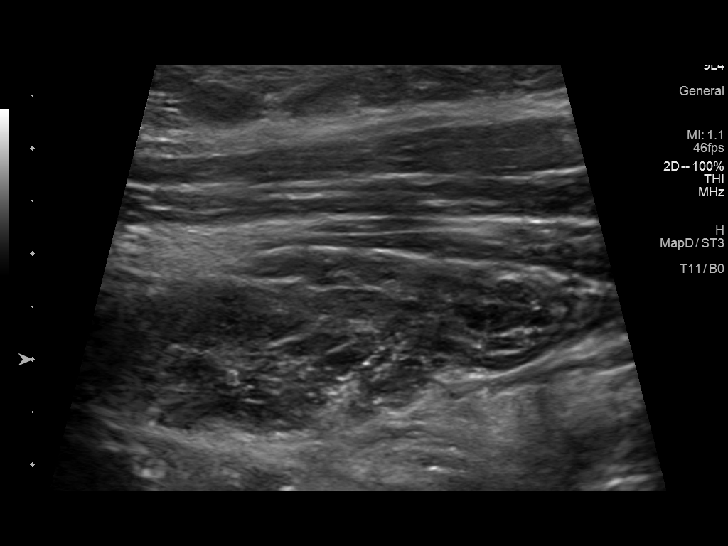
[im 9/53]
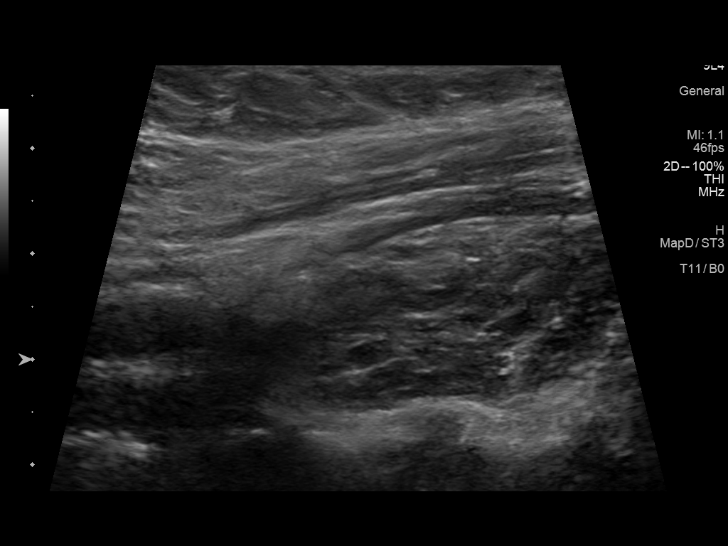
[im 14/53]
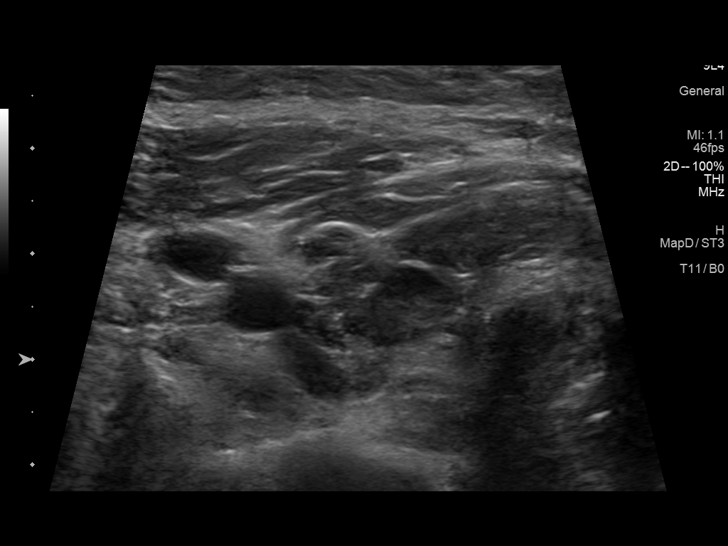
[im 18/53]
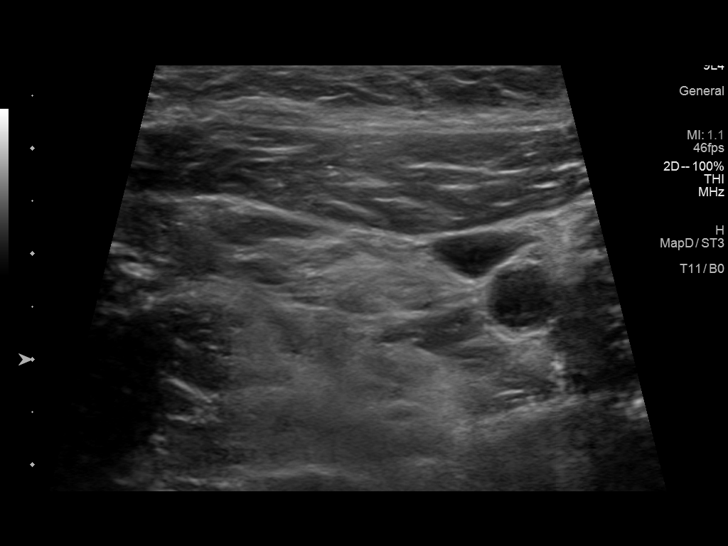
[im 22/53]
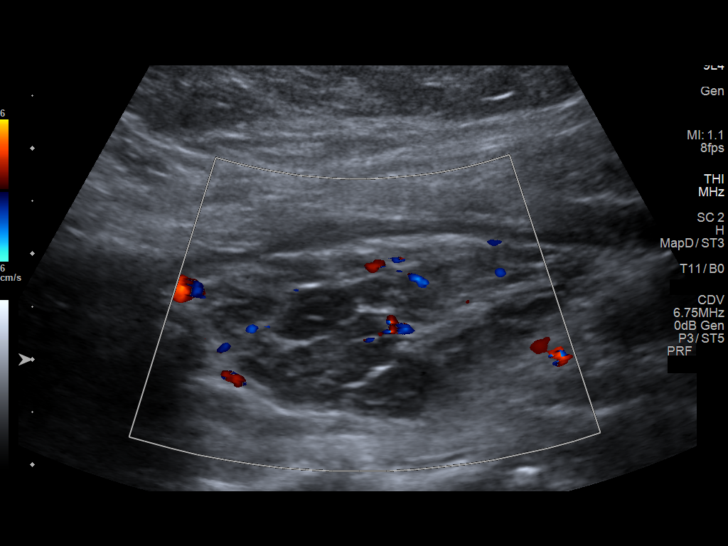
[im 27/53]
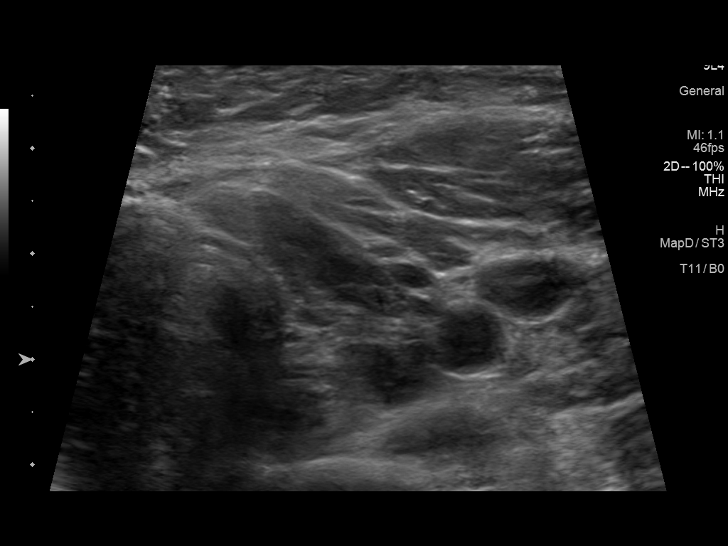
[im 31/53]
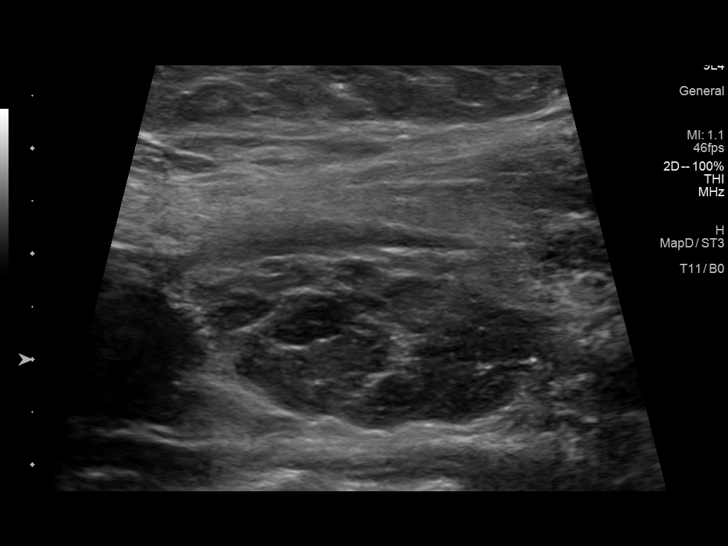
[im 35/53]
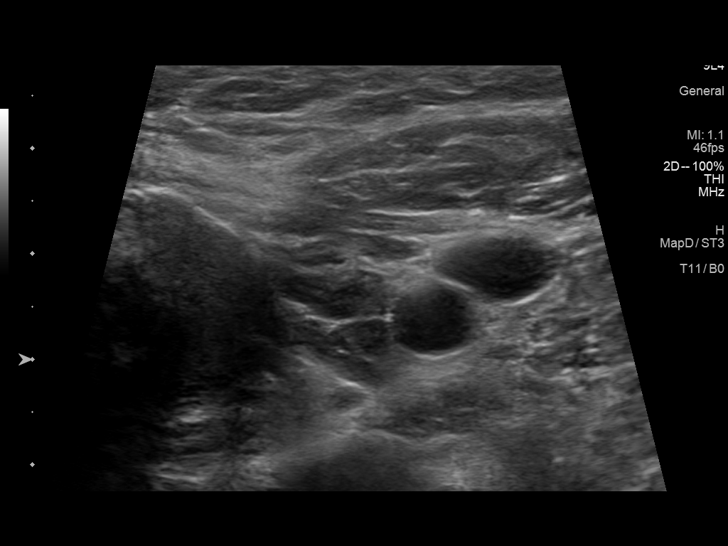
[im 40/53]
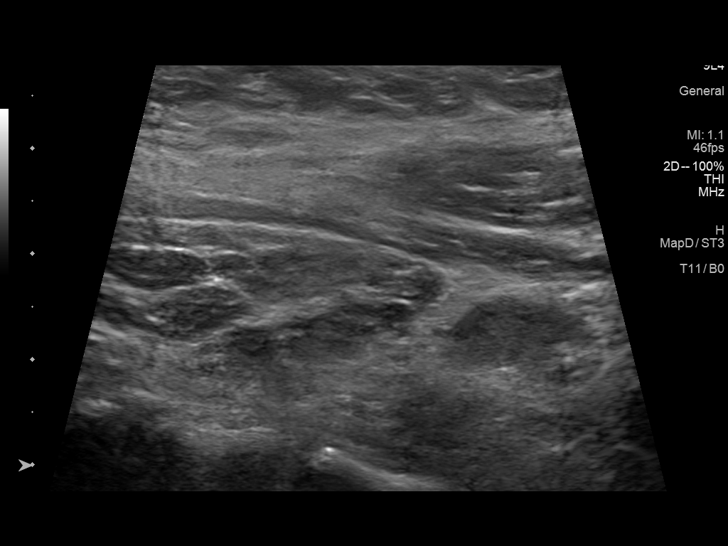
[im 44/53]
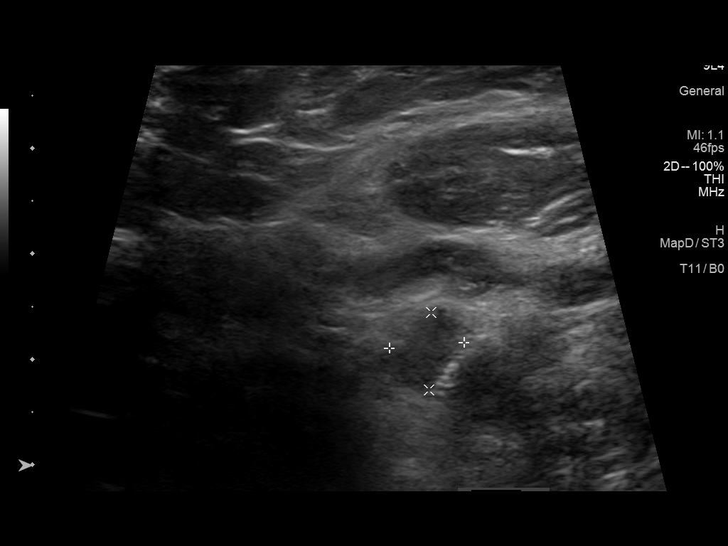
[im 48/53]
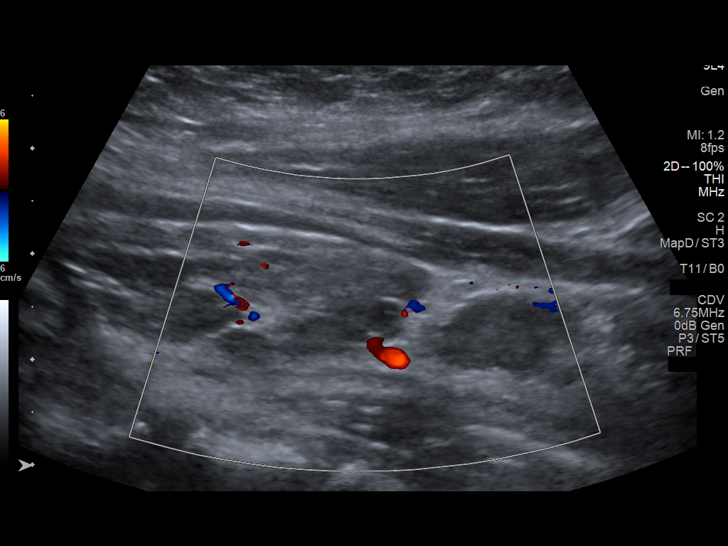
[im 53/53]
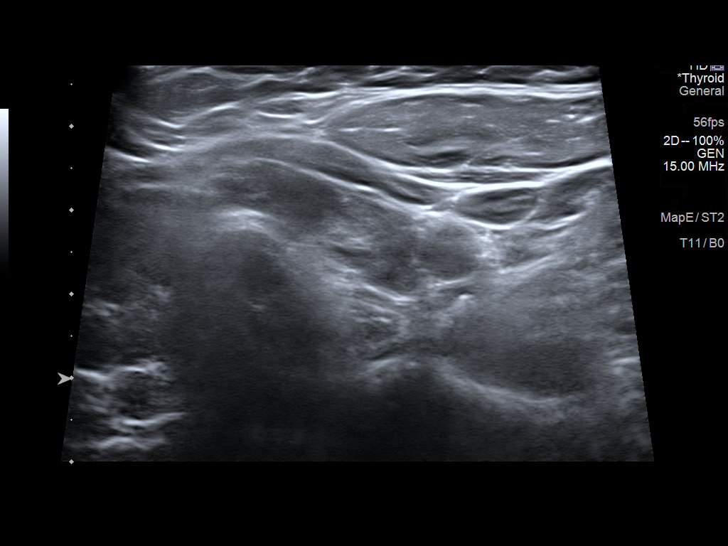

[13 of 25 positions shown; findings below may reference images not displayed]

FINDINGS: Parenchymal Echotexture: Markedly heterogenous

Isthmus: 0.7 cm

Right lobe: 5.1 x 1.8 x 1.5 cm

Left lobe: 3.8 x 1.7 x 1.5 cm

_________________________________________________________

Estimated total number of nodules >/= 1 cm: 0

Number of spongiform nodules >/=  2 cm not described below (TR1): 0

Number of mixed cystic and solid nodules >/= 1.5 cm not described
below (TR2): 0

_________________________________________________________

Again identified is an exophytic nodule adjacent to the left
inferior thyroid gland measuring approximately 1.4 x 0.7 x 0.7 cm
(previously measuring 1.3 x 0.6 x 0.7 cm). This nodule appears to be
separate from the thyroid gland itself (image 42/51).
IMPRESSION: 1. Again noted is a markedly heterogeneous thyroid gland which can
be seen in patients with thyroiditis.
2. Again noted is a stable nodule adjacent to the left inferior
thyroid gland. Differential considerations remain the same including
a cervical lymph node versus an exophytic thyroid nodule versus less
likely a parathyroid adenoma. The stability across multiple years
suggest it is a benign process.

The above is in keeping with the ACR TI-RADS recommendations - [HOSPITAL] 6116;[DATE].

## 2022-10-09 ENCOUNTER — Encounter: Payer: Self-pay | Admitting: Family Medicine

## 2022-10-09 DIAGNOSIS — K625 Hemorrhage of anus and rectum: Secondary | ICD-10-CM

## 2022-10-09 NOTE — Progress Notes (Signed)
Appointment cancelled - please disregard. 

## 2022-10-14 NOTE — Telephone Encounter (Signed)
It is ok to place GI referral as requested. Thanks, BJ

## 2022-10-16 ENCOUNTER — Ambulatory Visit (INDEPENDENT_AMBULATORY_CARE_PROVIDER_SITE_OTHER): Payer: BC Managed Care – PPO | Admitting: Family Medicine

## 2022-10-16 ENCOUNTER — Encounter: Payer: Self-pay | Admitting: Family Medicine

## 2022-10-16 VITALS — BP 128/80 | HR 94 | Temp 98.7°F | Resp 12 | Ht 67.0 in | Wt 256.0 lb

## 2022-10-16 DIAGNOSIS — R202 Paresthesia of skin: Secondary | ICD-10-CM | POA: Diagnosis not present

## 2022-10-16 DIAGNOSIS — K625 Hemorrhage of anus and rectum: Secondary | ICD-10-CM

## 2022-10-16 DIAGNOSIS — M542 Cervicalgia: Secondary | ICD-10-CM | POA: Diagnosis not present

## 2022-10-16 MED ORDER — METHOCARBAMOL 500 MG PO TABS: 500.0000 mg | ORAL_TABLET | Freq: Three times a day (TID) | ORAL | 0 refills | Status: AC | PRN

## 2022-10-16 MED ORDER — PREDNISONE 20 MG PO TABS: ORAL_TABLET | ORAL | 0 refills | Status: AC

## 2022-10-16 MED ORDER — CELECOXIB 100 MG PO CAPS: 100.0000 mg | ORAL_CAPSULE | Freq: Two times a day (BID) | ORAL | 0 refills | Status: AC

## 2022-10-16 NOTE — Patient Instructions (Signed)
A few things to remember from today's visit:  Acute neck pain - Plan: DG Cervical Spine Complete, methocarbamol (ROBAXIN) 500 MG tablet, celecoxib (CELEBREX) 100 MG capsule  Rectal bleed  Paresthesia of arm - Plan: predniSONE (DELTASONE) 20 MG tablet  Do not take Celebrex and Prednisone together. Prednisone with breakfast. Local massage and range of motion exercises. Muscle relaxant causes drowsiness. If pain is not greatly improved in 1-2 weeks PT can be considered.  Appt with gastro is being arranged.  If you need refills for medications you take chronically, please call your pharmacy. Do not use My Chart to request refills or for acute issues that need immediate attention. If you send a my chart message, it may take a few days to be addressed, specially if I am not in the office.  Please be sure medication list is accurate. If a new problem present, please set up appointment sooner than planned today.

## 2022-10-16 NOTE — Progress Notes (Signed)
ACUTE VISIT Chief Complaint  Patient presents with   Neck Pain    Started yesterday around 2:30, no known injury   HPI: [name] is a 31 y.o. female with medical hx significant for GERD, chronic headache, anxiety, and hypothyroidism here today with her husband complaining of neck pain that started yesterday. The pain is located above the shoulder blades, extending from the middle of the shoulder blades to both sides of the shoulders and the base of the neck. She reports that the pain began suddenly around 2:30 PM while playing a video game and attempting to sit up. The pain is described as burning and achy. She had difficulty getting comfortable and sleeping due to the pain and is now unable to turn her head, lift, or bend down.  She has experienced similar, albeit less severe, pain in the past that lasted for approximately 10-15 minutes before resolving.   The current pain is constant, rated as a 7 out of 10, and has caused her to feel tense and her shoulders to feel heavy.  Neck Pain  This is a new problem. The current episode started yesterday. The problem occurs constantly. The problem has been unchanged. The pain is present in the midline. The pain is at a severity of 7/10. The pain is moderate. The symptoms are aggravated by coughing, sneezing, twisting and bending. Stiffness is present All day. Pertinent negatives include no chest pain, fever, headaches, leg pain, numbness, pain with swallowing, photophobia, syncope, trouble swallowing, visual change, weakness or weight loss. She has tried acetaminophen and heat for the symptoms. The treatment provided mild relief.  She also reports a burning sensation in her left forearm. She has been taking ibuprofen for pain management without relief.  -She has also experienced rectal bleeding , intermittently with the most recent episode involving bright red blood in the toilet and on toilet paper after passing a stool.  Negative for  dyschezia. She has had episodes of rectal bleeding a couple times in the past few months and usually proceeded by loose stools. She was attributing symptoms to metformin use.  Review of Systems  Constitutional:  Negative for fever and weight loss.  HENT:  Negative for sore throat and trouble swallowing.   Eyes:  Negative for photophobia.  Respiratory:  Negative for shortness of breath.   Cardiovascular:  Negative for chest pain and syncope.  Gastrointestinal:  Negative for abdominal distention, nausea and vomiting.  Musculoskeletal:  Positive for neck pain.  Skin:  Negative for rash.  Neurological:  Negative for weakness, numbness and headaches.  Hematological:  Negative for adenopathy. Does not bruise/bleed easily.  See other pertinent positives and negatives in HPI.  Current Outpatient Medications on File Prior to Visit  Medication Sig Dispense Refill   FLUoxetine (PROZAC) 20 MG capsule Take 1 capsule (20 mg total) by mouth daily. 30 capsule 1   levonorgestrel (MIRENA) 20 MCG/24HR IUD 1 each by Intrauterine route once.     levothyroxine (SYNTHROID) 150 MCG tablet Take 150 mcg by mouth daily in the afternoon. 2 tablets Monday- Saturday and 1 tablet on Sunday     metFORMIN (GLUCOPHAGE-XR) 500 MG 24 hr tablet Take 1 tablet (500 mg total) by mouth daily with breakfast. 30 tablet 0   metoprolol tartrate (LOPRESSOR) 25 MG tablet Take 0.5 tablets (12.5 mg total) by mouth 2 (two) times daily as needed (for palpitations). 30 tablet 4   nortriptyline (PAMELOR) 10 MG capsule Take 1 capsule (10 mg total) by mouth  at bedtime. 60 capsule 5   nortriptyline (PAMELOR) 50 MG capsule Take 1 capsule (50 mg total) by mouth at bedtime. Take 50 mg in addition to the 10 mg daily 30 capsule 5   ondansetron (ZOFRAN) 8 MG tablet Take 1 tablet (8 mg total) by mouth daily as needed for nausea. 15 tablet 0   Phentermine HCl (LOMAIRA) 8 MG TABS Take 8 mg by mouth daily before breakfast.     WEGOVY 0.25 MG/0.5ML SOAJ  Inject into the skin once a week.     No current facility-administered medications on file prior to visit.   Past Medical History:  Diagnosis Date   ACL tear 05/12/2014   injured on trampoline   Headache(784.0)    stress   Polycystic ovarian syndrome    no current med.   Thyroid disease    No Known Allergies  Social History   Socioeconomic History   Marital status: Married    Spouse name: Not on file   Number of children: 0   Years of education: College/RN   Highest education level: Not on file  Occupational History    Employer: [employer]    Comment: ED-RN  Tobacco Use   Smoking status: Never   Smokeless tobacco: Never  Vaping Use   Vaping Use: Never used  Substance and Sexual Activity   Alcohol use: Yes    Comment: rare   Drug use: No   Sexual activity: Not on file  Other Topics Concern   Not on file  Social History Narrative   Patient lives at home with family.   Caffeine Use:1-2 cups   Right handed   Social Determinants of Health   Financial Resource Strain: Not on file  Food Insecurity: Food Insecurity Present (06/14/2021)   Hunger Vital Sign    Worried About Running Out of Food in the Last Year: Sometimes true    Ran Out of Food in the Last Year: Never true  Transportation Needs: Not on file  Physical Activity: Not on file  Stress: Not on file  Social Connections: Not on file   Vitals:   10/16/22 1540  BP: 128/80  Pulse: 94  Resp: 12  Temp: 98.7 F (37.1 C)  SpO2: 98%   Body mass index is 40.1 kg/m.  Physical Exam Vitals and nursing note reviewed.  Constitutional:      General: She is not in acute distress.    Appearance: She is well-developed. She is not ill-appearing.  HENT:     Head: Normocephalic and atraumatic.  Eyes:     Conjunctiva/sclera: Conjunctivae normal.  Cardiovascular:     Rate and Rhythm: Normal rate and regular rhythm.     Heart sounds: No murmur heard. Pulmonary:     Effort: Pulmonary effort is normal. No  respiratory distress.     Breath sounds: Normal breath sounds.  Abdominal:     Palpations: Abdomen is soft. There is no hepatomegaly or mass.     Tenderness: There is no abdominal tenderness.  Musculoskeletal:     Right shoulder: Normal range of motion.     Left shoulder: Normal range of motion.     Cervical back: Spasms and tenderness present. No bony tenderness. Pain with movement present. No muscular tenderness. Decreased range of motion.  Lymphadenopathy:     Cervical: No cervical adenopathy.     Upper Body:     Right upper body: No supraclavicular adenopathy.     Left upper body: No supraclavicular adenopathy.  Skin:  General: Skin is warm.     Findings: No erythema or rash.  Neurological:     General: No focal deficit present.     Mental Status: She is alert and oriented to person, place, and time.     Gait: Gait normal.     Deep Tendon Reflexes:     Reflex Scores:      Patellar reflexes are 2+ on the right side and 2+ on the left side. Psychiatric:        Mood and Affect: Mood and affect normal.   ASSESSMENT AND PLAN: Ms. Leopard is a 31 yo female seen for a day hx of neck pain.  Acute neck pain Hx and examination today do not suggest a serious process. ROM exercises recommended and continue lidocaine patch. Methocarbamol and Celebrex side effects discussed. Further recommendations according to cervical X ray.  -     Methocarbamol; Take 1 tablet (500 mg total) by mouth every 8 (eight) hours as needed for up to 10 days for muscle spasms.  Dispense: 30 tablet; Refill: 0 -     Celecoxib; Take 1 capsule (100 mg total) by mouth 2 (two) times daily for 10 days.  Dispense: 20 capsule; Refill: 0  Paresthesia of arm Burning sensation in left forearm, ? Radicular pain. Recommend Prednisone taper, side effects discussed. Instructed to take it with breakfast and not at the same time with Celebrex. Instructed about warning signs.  -     predniSONE; 2 tabs for 5 days, 1 tabs for 4  days, and 1/2 tab for 3 days. Take tables together with breakfast.  Dispense: 16 tablet; Refill: 0  Rectal bleed Asymptomatic at this time. Intermittently for the past few months. Avoid constipation. No Fhx of IBD. GI referral has been placed. Instructed about warning signs.  Return if symptoms worsen or fail to improve, for keep next appointment.  Merve Hotard G. [place], MD  [facility]. [place] office.

## 2022-10-17 ENCOUNTER — Other Ambulatory Visit: Payer: BC Managed Care – PPO

## 2022-10-17 ENCOUNTER — Ambulatory Visit (INDEPENDENT_AMBULATORY_CARE_PROVIDER_SITE_OTHER): Payer: BC Managed Care – PPO

## 2022-10-17 DIAGNOSIS — M542 Cervicalgia: Secondary | ICD-10-CM

## 2022-12-12 ENCOUNTER — Ambulatory Visit: Payer: BC Managed Care – PPO | Admitting: Neurology

## 2023-01-03 ENCOUNTER — Encounter: Payer: Self-pay | Admitting: Family Medicine

## 2023-01-03 ENCOUNTER — Telehealth (INDEPENDENT_AMBULATORY_CARE_PROVIDER_SITE_OTHER): Payer: BC Managed Care – PPO | Admitting: Family Medicine

## 2023-01-03 VITALS — Ht 67.0 in

## 2023-01-03 DIAGNOSIS — U071 COVID-19: Secondary | ICD-10-CM | POA: Diagnosis not present

## 2023-01-03 DIAGNOSIS — H9202 Otalgia, left ear: Secondary | ICD-10-CM | POA: Diagnosis not present

## 2023-01-03 MED ORDER — NIRMATRELVIR/RITONAVIR (PAXLOVID)TABLET: 3.0000 | ORAL_TABLET | Freq: Two times a day (BID) | ORAL | 0 refills | Status: AC

## 2023-01-03 NOTE — Progress Notes (Signed)
Virtual Visit via Video Note I connected with Whitney Aguirre on 01/03/23 by a video enabled telemedicine application and verified that I am speaking with the correct person using two identifiers. Location patient: home Location provider:work office Persons participating in the virtual visit: patient, provider  I discussed the limitations of evaluation and management by telemedicine and the availability of in person appointments. The patient expressed understanding and agreed to proceed.  Chief Complaint  Patient presents with   Covid Positive   HPI: Whitney Aguirre is a 32 year old female with history of bilateral headaches, PCOS, and hypothyroidism complaining of respiratory symptoms that started today.  She is experiencing a cough, "scratchy"/ sore throat, sneezing, rhinorrhea, and a sensation of her ear being "clogged." She had left earache earlier today but it has improved. Mild body aches this morning. She has not noted ear drainage. She has not tried OTC medication for symptoms. She denies having a fever, chills, wheezing, dyspnea, CP, abdominal pain, nausea, vomiting, changes in bowel habits, urinary symptoms, or skin rash. She reports experiencing body aches upon waking up this morning.   She tested positive for COVID-19 this morning. Her husband has been sick and had a positive COVID-19 test on Monday evening. LMP 12/04/22.  ROS: See pertinent positives and negatives per HPI.  Past Medical History:  Diagnosis Date   ACL tear 05/12/2014   injured on trampoline   Headache(784.0)    stress   Polycystic ovarian syndrome    no current med.   Thyroid disease     Past Surgical History:  Procedure Laterality Date   KNEE ARTHROSCOPY WITH ANTERIOR CRUCIATE LIGAMENT (ACL) REPAIR Left 06/05/2014   Procedure: LEFT KNEE ARTHROSCOPY WITH ANTERIOR CRUCIATE LIGAMENT (ACL) REPAIR;  Surgeon: Alta Corning, MD;  Location: [place];  Service: Orthopedics;  Laterality: Left;    WISDOM TOOTH EXTRACTION      Family History  Problem Relation Age of Onset   Cardiomyopathy Mother    High blood pressure Mother    Emphysema Mother    Ulcers Father    Cancer Paternal Grandfather    Diabetes Other    Hypertension Other     Social History   Socioeconomic History   Marital status: Married    Spouse name: Not on file   Number of children: 0   Years of education: College/RN   Highest education level: Not on file  Occupational History    Employer: [employer]    Comment: ED-RN  Tobacco Use   Smoking status: Never   Smokeless tobacco: Never  Vaping Use   Vaping Use: Never used  Substance and Sexual Activity   Alcohol use: Yes    Comment: rare   Drug use: No   Sexual activity: Not on file  Other Topics Concern   Not on file  Social History Narrative   Patient lives at home with family.   Caffeine Use:1-2 cups   Right handed   Social Determinants of Health   Financial Resource Strain: Not on file  Food Insecurity: Food Insecurity Present (06/14/2021)   Hunger Vital Sign    Worried About Running Out of Food in the Last Year: Sometimes true    Ran Out of Food in the Last Year: Never true  Transportation Needs: Not on file  Physical Activity: Not on file  Stress: Not on file  Social Connections: Not on file  Intimate Partner Violence: Not on file    Current Outpatient Medications:    levonorgestrel (MIRENA)  20 MCG/24HR IUD, 1 each by Intrauterine route once., Disp: , Rfl:    levothyroxine (SYNTHROID) 150 MCG tablet, Take 150 mcg by mouth daily in the afternoon. 2 tablets Monday- Saturday and 1 tablet on Sunday, Disp: , Rfl:    metFORMIN (GLUCOPHAGE-XR) 500 MG 24 hr tablet, Take 1 tablet (500 mg total) by mouth daily with breakfast., Disp: 30 tablet, Rfl: 0   nortriptyline (PAMELOR) 10 MG capsule, Take 1 capsule (10 mg total) by mouth at bedtime., Disp: 60 capsule, Rfl: 5   nortriptyline (PAMELOR) 50 MG capsule, Take 1 capsule (50 mg total) by mouth  at bedtime. Take 50 mg in addition to the 10 mg daily, Disp: 30 capsule, Rfl: 5   ondansetron (ZOFRAN) 8 MG tablet, Take 1 tablet (8 mg total) by mouth daily as needed for nausea., Disp: 15 tablet, Rfl: 0   Phentermine HCl (LOMAIRA) 8 MG TABS, Take 8 mg by mouth daily before breakfast., Disp: , Rfl:    WEGOVY 0.25 MG/0.5ML SOAJ, Inject into the skin once a week., Disp: , Rfl:    metoprolol tartrate (LOPRESSOR) 25 MG tablet, Take 0.5 tablets (12.5 mg total) by mouth 2 (two) times daily as needed (for palpitations)., Disp: 30 tablet, Rfl: 4  EXAM:  VITALS per patient if applicable:Ht 5\' 7"  (1.702 m)   LMP 12/04/2022   BMI 40.10 kg/m   GENERAL: alert, oriented, appears well and in no acute distress  HEENT: atraumatic, conjunctiva clear, no obvious abnormalities on inspection of external nose and ears She cannot pop left ear when doing auto inflation maneuvers, right ear did.  NECK: normal movements of the head and neck  LUNGS: on inspection no signs of respiratory distress, breathing rate appears normal, no obvious gross SOB, gasping or wheezing  CV: no obvious cyanosis  MS: moves all visible extremities without noticeable abnormality  PSYCH/NEURO: pleasant and cooperative, no obvious depression or anxiety, speech and thought processing grossly intact  ASSESSMENT AND PLAN:  Discussed the following assessment and plan:  COVID-19 virus infection We discussed Dx,possible complications and treatment options. She has a mild to moderate case with low risk for complications. We discussed oral antiviral options and side effects. She is not interested in taking antiviral medication at this time.  I sent prescription for Paxlovid to the pharmacy, so she can start if symptoms get worse or if she develops fever.  She understands that if she decides to the medication it has to be within 5 days from symptoms onset.  Symptomatic treatment with plenty of fluids,rest,tylenol 500 mg 3-4 times per  day prn. Throat lozenges if needed for sore throat. 5 to 7 days of quarantine. She states that she does not need a note for work. Explained that cough and congestion may last a few more days and even weeks after acute symptoms have resolved. Clearly instructed about warning signs.  -     nirmatrelvir/ritonavir; Take 3 tablets by mouth 2 (two) times daily for 5 days. (Take nirmatrelvir 150 mg two tablets twice daily for 5 days and ritonavir 100 mg one tablet twice daily for 5 days) Patient GFR is 119 in 01/2019  Dispense: 30 tablet; Refill: 0  Earache, left We discussed possible etiologies, most likely caused by above problem. Eustachian tube dysfunction, recommend auto inflation maneuvers a few times throughout the day. Instructed about warning signs. If problem is persistent in 10-14 days, she can come to the office to have ear examination.   We discussed possible serious and likely etiologies,  options for evaluation and workup, limitations of telemedicine visit vs in person visit, treatment, treatment risks and precautions. The patient was advised to call back or seek an in-person evaluation if the symptoms worsen or if the condition fails to improve as anticipated. I discussed the assessment and treatment plan with the patient. The patient was provided an opportunity to ask questions and all were answered. The patient agreed with the plan and demonstrated an understanding of the instructions.  Return if symptoms worsen or fail to improve.  Trae Bovenzi G. [place], MD  [facility]. [place] office.

## 2023-01-09 ENCOUNTER — Encounter: Payer: Self-pay | Admitting: Gastroenterology

## 2023-01-17 DIAGNOSIS — R7301 Impaired fasting glucose: Secondary | ICD-10-CM | POA: Diagnosis not present

## 2023-01-17 DIAGNOSIS — E282 Polycystic ovarian syndrome: Secondary | ICD-10-CM | POA: Diagnosis not present

## 2023-01-17 DIAGNOSIS — E039 Hypothyroidism, unspecified: Secondary | ICD-10-CM | POA: Diagnosis not present

## 2023-01-29 ENCOUNTER — Other Ambulatory Visit (HOSPITAL_COMMUNITY): Payer: Self-pay

## 2023-01-29 DIAGNOSIS — E039 Hypothyroidism, unspecified: Secondary | ICD-10-CM | POA: Diagnosis not present

## 2023-01-29 DIAGNOSIS — E669 Obesity, unspecified: Secondary | ICD-10-CM | POA: Diagnosis not present

## 2023-01-29 DIAGNOSIS — R7301 Impaired fasting glucose: Secondary | ICD-10-CM | POA: Diagnosis not present

## 2023-01-29 DIAGNOSIS — E282 Polycystic ovarian syndrome: Secondary | ICD-10-CM | POA: Diagnosis not present

## 2023-01-29 MED ORDER — WEGOVY 0.5 MG/0.5ML ~~LOC~~ SOAJ
0.5000 mg | SUBCUTANEOUS | 5 refills | Status: DC
  Filled 2023-01-29: qty 2, 28d supply, fill #0

## 2023-02-06 ENCOUNTER — Other Ambulatory Visit (HOSPITAL_COMMUNITY): Payer: Self-pay

## 2023-02-06 MED ORDER — WEGOVY 0.5 MG/0.5ML ~~LOC~~ SOAJ
0.5000 mg | SUBCUTANEOUS | 5 refills | Status: DC
  Filled 2023-02-06: qty 2, 28d supply, fill #0

## 2023-02-11 ENCOUNTER — Encounter: Payer: Self-pay | Admitting: Neurology

## 2023-02-12 NOTE — Progress Notes (Deleted)
NEUROLOGY FOLLOW UP OFFICE NOTE  Marsie Kraynak [id number]  Assessment/Plan:   Episodic tension-type headache, not intractable      1  Nortriptyline 60mg  at bedtime (one 50mg  and one 10mg  capsule together) *** 2  Limit use of pain relievers to no more than 2 days out of week to prevent risk of rebound or medication-overuse headache. 3  Keep headache diary 4  Follow up 6 months. ***  Subjective:  Whitney Aguirre is a 32 year old right-handed female with Hashimoto's thyroiditis and PCOS and task specific dystonia who follows up for headache and task-specific dystonia.  UPDATE: *** Intensity:  7/10 Duration:  2 hours with Excedrin Frequency:  1 headache in last 30+ days Frequency of abortive medication: 1 in last 30 days Last 2 headaches were different - it was left sided pressure pain from temple to jaw.  No associated symptoms.  She is not aware if she clenches her teeth.    Current NSAIDS/analgesics:  Excedrin Migrane/Tension Current triptans:  none Current ergotamine:  none Current anti-emetic:  Zofran 8mg  Current muscle relaxants:  none Current Antihypertensive medications:  metoprolol tartrate Current Antidepressant medications:  nortriptyline 60mg  at bedtime, fluoxetine 20mg  daily Current Anticonvulsant medications:  none Current anti-CGRP:  none Current Vitamins/Herbal/Supplements:  none Current Antihistamines/Decongestants:  none Other therapy:  none Hormone/birth control:  Mirena Other medications:  levothyroxine, metformin  Caffeine:  1 20 oz cup coffee daily.  Sometimes soda Alcohol:  occasional Smoker:  no Diet:  48 to 64 oz water daily.  Does not skip meals Exercise:  not routine Depression/Anxiety:  father was ill and she was primary caregiver.  Father passed away this past weekend. Other pain:  no Sleep hygiene:  poor.  Nortriptyline not helping with sleeping.  HISTORY:  Onset:  headaches since she was 32 years old. Location:  usually forehead,  top or back of head at base of neck Quality:  pressure vs throbbing vs pounding vs stabbling Intensity:  5-6/10.  She denies new headache, thunderclap headache or severe headache that wakes her from sleep. Aura:  absent Prodrome:  absent Associated symptoms:  No photophobia, phonophobia.  Rarely nausea.  She denies associated vomiting, visual disturbance, unilateral numbness or weakness. Duration:  Usually 30 to 60 minutes but may last up to 5-6 hours Frequency:  daily until starting nortriptyline.  Started nortriptyline on 11/03/2021.  Now approximately 1 to 2 days a week.   Frequency of abortive medication: 1 to 2 days a week. Triggers:  stress Relieving factors:  Excedrin Activity:  able to function   She started noticing a tremor in her hands a few months ago.  Happens at work when she is using her hands, such as at the computer or writing.  She feels the tremor start in hand and she needs to stop and rest until it passes.  Thumb starts to twitch and intrinsic hand muscles spasm.  Hand is in a flexed position.  Happens in both hands but mostly the left hand.  Lasts 1 to 4 minutes.  Needs to stop and let it pass.  No pain, weakness or numbness.  Occurs 1 to 2 times a week.  Denies family history of tremor.  Thyroid has been controlled.        Past NSAIDS/analgesics:  ibuprofen, naproxen, acetaminophen Past abortive triptans:  none Past abortive ergotamine:  none Past muscle relaxants:  none Past anti-emetic:  none Past antihypertensive medications:  none Past antidepressant medications:  amitriptyline Past anticonvulsant medications:  topiramate Past anti-CGRP:  none Past vitamins/Herbal/Supplements:  none Past antihistamines/decongestants:  Flonase, Sudafed Other past therapies:  none    Family history of headache:  mom (severe migraines)  PAST MEDICAL HISTORY: Past Medical History:  Diagnosis Date   ACL tear 05/12/2014   injured on trampoline   Headache(784.0)    stress    Polycystic ovarian syndrome    no current med.   Thyroid disease     MEDICATIONS: Current Outpatient Medications on File Prior to Visit  Medication Sig Dispense Refill   levonorgestrel (MIRENA) 20 MCG/24HR IUD 1 each by Intrauterine route once.     levothyroxine (SYNTHROID) 150 MCG tablet Take 150 mcg by mouth daily in the afternoon. 2 tablets Monday- Saturday and 1 tablet on Sunday     metFORMIN (GLUCOPHAGE-XR) 500 MG 24 hr tablet Take 1 tablet (500 mg total) by mouth daily with breakfast. 30 tablet 0   metoprolol tartrate (LOPRESSOR) 25 MG tablet Take 0.5 tablets (12.5 mg total) by mouth 2 (two) times daily as needed (for palpitations). 30 tablet 4   nortriptyline (PAMELOR) 10 MG capsule Take 1 capsule (10 mg total) by mouth at bedtime. 60 capsule 5   nortriptyline (PAMELOR) 50 MG capsule Take 1 capsule (50 mg total) by mouth at bedtime. Take 50 mg in addition to the 10 mg daily 30 capsule 5   ondansetron (ZOFRAN) 8 MG tablet Take 1 tablet (8 mg total) by mouth daily as needed for nausea. 15 tablet 0   Phentermine HCl (LOMAIRA) 8 MG TABS Take 8 mg by mouth daily before breakfast.     Semaglutide-Weight Management (WEGOVY) 0.5 MG/0.5ML SOAJ Inject 0.5 mg into the skin once a week. 2 mL 5   Semaglutide-Weight Management (WEGOVY) 0.5 MG/0.5ML SOAJ Inject 0.5 mg into the skin once a week. 2 mL 5   WEGOVY 0.25 MG/0.5ML SOAJ Inject into the skin once a week.     No current facility-administered medications on file prior to visit.    ALLERGIES: No Known Allergies  FAMILY HISTORY: Family History  Problem Relation Age of Onset   Cardiomyopathy Mother    High blood pressure Mother    Emphysema Mother    Ulcers Father    Cancer Paternal Grandfather    Diabetes Other    Hypertension Other       *** General: No acute distress.  Patient appears well-groomed.   Head:  Normocephalic/atraumatic Eyes:  Fundi examined but not visualized Neck: supple, no paraspinal tenderness, full range of  motion Heart:  Regular rate and rhythm Neurological Exam: ***   Metta Clines, DO  CC: Betty Martinique, MD

## 2023-02-13 ENCOUNTER — Ambulatory Visit: Payer: BC Managed Care – PPO | Admitting: Neurology

## 2023-02-15 ENCOUNTER — Encounter: Payer: Self-pay | Admitting: Gastroenterology

## 2023-02-15 ENCOUNTER — Ambulatory Visit: Payer: BC Managed Care – PPO | Admitting: Gastroenterology

## 2023-02-15 VITALS — BP 122/80 | HR 83 | Ht 67.0 in | Wt 262.0 lb

## 2023-02-15 DIAGNOSIS — K640 First degree hemorrhoids: Secondary | ICD-10-CM

## 2023-02-15 DIAGNOSIS — K921 Melena: Secondary | ICD-10-CM | POA: Diagnosis not present

## 2023-02-15 NOTE — Patient Instructions (Signed)
A high fiber diet with plenty of fluids (up to 8 glasses of water daily) is suggested to relieve these symptoms.  Metamucil, 1 tablespoon once or twice daily can be used to keep bowels regular if needed. _______________________________________________________  If your blood pressure at your visit was 140/90 or greater, please contact your primary care physician to follow up on this.  _______________________________________________________  If you are age 30 or older, your body mass index should be between 23-30. Your Body mass index is 41.04 kg/m. If this is out of the aforementioned range listed, please consider follow up with your Primary Care Provider.  If you are age 17 or younger, your body mass index should be between 19-25. Your Body mass index is 41.04 kg/m. If this is out of the aformentioned range listed, please consider follow up with your Primary Care Provider.   ________________________________________________________  The [place] [facility] providers would like to encourage you to use [facility] to communicate with providers for non-urgent requests or questions.  Due to long hold times on the telephone, sending your provider a message by [facility] may be a faster and more efficient way to get a response.  Please allow 48 business hours for a response.  Please remember that this is for non-urgent requests.  _______________________________________________________

## 2023-02-15 NOTE — Progress Notes (Signed)
HPI : Whitney Aguirre is a very pleasant 32 year old female with a history of obesity, hypothyroidism and PCOS who is referred to [place] by Dr. Betty Martinique for further evaluation of bright red blood per rectum. She first noted blood in the stool over a year ago.  She was prescribed suppositories which resolved the bleeding for a while, but then it returned.  She sees the blood intermittently and is noticed more with hard stools and constipation as well as when she has frequent bowel movements.  She usually has a bowel movement 2-3 times per day (has been more frequent since starting metformin).  Stools are usually formed a solid. The blood is always bright red and in the water and on the toilet paper, not sure if she has ever seen it in the stool itself.  Sometimes she sees just sees blood without stool.  No clots. Sometimes has to strain with bowel movements when stool is hard.  No pain with passage of stool.  No anal discomfort. No itching/burning.  No lumps or bumps.  No seepage.  No urgency or tenesmus.  No nocturnal stools.  No unintentional weight loss.   She sometimes has reflux symptoms (a few times a month).  She doesn't take anything for this, but she does take Zofran sometimes for nausea. Otherwise she denies any other chronic GI symptoms.  No family history of colon cancer  Past Medical History:  Diagnosis Date   ACL tear 05/12/2014   injured on trampoline   Hashimoto's disease 2018   Headache(784.0)    stress   Polycystic ovarian syndrome    no current med.   Thyroid disease      Past Surgical History:  Procedure Laterality Date   KNEE ARTHROSCOPY WITH ANTERIOR CRUCIATE LIGAMENT (ACL) REPAIR Left 06/05/2014   Procedure: LEFT KNEE ARTHROSCOPY WITH ANTERIOR CRUCIATE LIGAMENT (ACL) REPAIR;  Surgeon: Alta Corning, MD;  Location: [facility];  Service: Orthopedics;  Laterality: Left;   WISDOM TOOTH EXTRACTION     Family History  Problem Relation Age of Onset    Cardiomyopathy Mother    High blood pressure Mother    Emphysema Mother    Ulcers Father    Cancer Paternal Grandfather    Diabetes Other    Hypertension Other    Liver disease Neg Hx    Colon cancer Neg Hx    Esophageal cancer Neg Hx    Social History   Tobacco Use   Smoking status: Never   Smokeless tobacco: Never  Vaping Use   Vaping Use: Never used  Substance Use Topics   Alcohol use: Yes    Comment: rare   Drug use: No   Current Outpatient Medications  Medication Sig Dispense Refill   levothyroxine (SYNTHROID) 150 MCG tablet Take 150 mcg by mouth daily in the afternoon. 2 tablets Monday- Saturday and 1 tablet on Sunday     metFORMIN (GLUCOPHAGE-XR) 500 MG 24 hr tablet Take 1 tablet (500 mg total) by mouth daily with breakfast. 30 tablet 0   nortriptyline (PAMELOR) 10 MG capsule Take 1 capsule (10 mg total) by mouth at bedtime. 60 capsule 5   nortriptyline (PAMELOR) 50 MG capsule Take 1 capsule (50 mg total) by mouth at bedtime. Take 50 mg in addition to the 10 mg daily 30 capsule 5   ondansetron (ZOFRAN) 8 MG tablet Take 1 tablet (8 mg total) by mouth daily as needed for nausea. 15 tablet 0   Semaglutide-Weight Management (  WEGOVY) 0.5 MG/0.5ML SOAJ Inject 0.5 mg into the skin once a week. 2 mL 5   WEGOVY 0.25 MG/0.5ML SOAJ Inject into the skin once a week.     levonorgestrel (MIRENA) 20 MCG/24HR IUD 1 each by Intrauterine route once. (Patient not taking: Reported on 02/15/2023)     metoprolol tartrate (LOPRESSOR) 25 MG tablet Take 0.5 tablets (12.5 mg total) by mouth 2 (two) times daily as needed (for palpitations). 30 tablet 4   Phentermine HCl (LOMAIRA) 8 MG TABS Take 8 mg by mouth daily before breakfast. (Patient not taking: Reported on 02/15/2023)     Semaglutide-Weight Management (WEGOVY) 0.5 MG/0.5ML SOAJ Inject 0.5 mg into the skin once a week. (Patient not taking: Reported on 02/15/2023) 2 mL 5   No current facility-administered medications for this visit.   No Known  Allergies   Review of Systems: All systems reviewed and negative except where noted in HPI.    No results found.  Physical Exam: BP 122/80   Pulse 83   Ht 5\' 7"  (1.702 m)   Wt 262 lb (118.8 kg)   SpO2 98%   BMI 41.04 kg/m  Constitutional: Pleasant,well-developed, Caucasian female in no acute distress. HEENT: Normocephalic and atraumatic. Conjunctivae are normal. No scleral icterus. Cardiovascular: Normal rate, regular rhythm.  Pulmonary/chest: Effort normal and breath sounds normal. No wheezing, rales or rhonchi. Abdominal: Soft, nondistended, nontender. Bowel sounds active throughout. There are no masses palpable. No hepatomegaly. Extremities: no edema Rectal: (Performed with CMA Short present) No skin tag or external hemorrhoid noted. Normal perianal skin.  Digital rectal exam with normal sphincter tone.  No mass or lesion palpated.  No evidence of anal fissure. Neurological: Alert and oriented to person place and time. Skin: Skin is warm and dry. No rashes noted. Psychiatric: Normal mood and affect. Behavior is normal.  CBC    Component Value Date/Time   WBC 7.8 07/02/2020 1114   RBC 4.88 07/02/2020 1114   HGB 14.1 07/02/2020 1114   HCT 43.9 07/02/2020 1114   PLT 234 07/02/2020 1114   MCV 90.0 07/02/2020 1114   MCH 28.9 07/02/2020 1114   MCHC 32.1 07/02/2020 1114   RDW 13.3 07/02/2020 1114   LYMPHSABS 2,613 07/02/2020 1114   MONOABS 0.5 10/04/2016 1023   EOSABS 257 07/02/2020 1114   BASOSABS 31 07/02/2020 1114    CMP     Component Value Date/Time   NA 142 07/02/2020 1114   K 4.3 07/02/2020 1114   CL 107 07/02/2020 1114   CO2 26 07/02/2020 1114   GLUCOSE 89 07/02/2020 1114   BUN 14 07/02/2020 1114   CREATININE 0.89 07/02/2020 1114   CALCIUM 9.7 07/02/2020 1114   PROT 7.1 07/02/2020 1114   ALBUMIN 4.2 10/04/2016 1023   AST 17 07/02/2020 1114   ALT 25 07/02/2020 1114   ALKPHOS 49 10/04/2016 1023   BILITOT 0.2 07/02/2020 1114   GFRNONAA >90 12/07/2014  1729   GFRAA >90 12/07/2014 1729     ASSESSMENT AND PLAN: 32 year old female with 1+ year history of intermittent bright red blood per rectum without any other concerning symptoms.   Hematochezia noticed more with hard stools/straining as well as with frequent bowel movements.  This history is very consistent with bleeding from internal hemorrhoids. We discussed the anatomy and physiology of hemorrhoids, as well as the principles of hemorrhoid management to include optimization of bowel habits, with a goal of passing a soft formed stool daily without straining, avoidance of hard stools and  straining, limiting time on the toilet to less than 5 minutes.  We discussed the role of fiber in optimizing the stool bulk and consistency.  We also discussed the role of hemorrhoid banding for persistent symptoms. I have very little suspicion for other causes of her bleeding such as IBD or a mass lesion, but I did offer either colonoscopy or flexible sigmoidoscopy to exclude these causes of rectal bleeding. The patient declined endoscopy if it was not absolutely necessary but said she would reconsider it if she continues to have bleeding despite fiber supplementation.  Hematochezia, consistent with hemorrhoidal bleeding - Daily metamucil - Offered colonoscopy/flex sig to definitively exclude other etiologies; decline for now - Discussed banding  Comfort Iversen E. Candis Schatz, MD [facility]  Martinique, Betty G, MD

## 2023-02-20 ENCOUNTER — Ambulatory Visit
Admission: EM | Admit: 2023-02-20 | Discharge: 2023-02-20 | Disposition: A | Discharge status: Home / Self Care | Payer: BC Managed Care – PPO | Attending: Urgent Care | Admitting: Urgent Care

## 2023-02-20 ENCOUNTER — Other Ambulatory Visit (HOSPITAL_COMMUNITY): Payer: Self-pay

## 2023-02-20 DIAGNOSIS — R110 Nausea: Secondary | ICD-10-CM | POA: Diagnosis not present

## 2023-02-20 DIAGNOSIS — B349 Viral infection, unspecified: Secondary | ICD-10-CM

## 2023-02-20 DIAGNOSIS — R197 Diarrhea, unspecified: Secondary | ICD-10-CM | POA: Diagnosis not present

## 2023-02-20 LAB — POCT INFLUENZA A/B
Influenza A, POC: NEGATIVE
Influenza B, POC: NEGATIVE

## 2023-02-20 LAB — POCT URINALYSIS DIP (MANUAL ENTRY)
Glucose, UA: NEGATIVE mg/dL
Ketones, POC UA: NEGATIVE mg/dL
Nitrite, UA: NEGATIVE
Spec Grav, UA: 1.025 (ref 1.010–1.025)
Urobilinogen, UA: 1 E.U./dL
pH, UA: 6 (ref 5.0–8.0)

## 2023-02-20 LAB — POCT URINE PREGNANCY: Preg Test, Ur: NEGATIVE

## 2023-02-20 MED ORDER — LOPERAMIDE HCL 2 MG PO CAPS: 2.0000 mg | ORAL_CAPSULE | Freq: Two times a day (BID) | ORAL | 0 refills | Status: DC | PRN

## 2023-02-20 MED ORDER — ONDANSETRON 8 MG PO TBDP: 8.0000 mg | ORAL_TABLET | Freq: Three times a day (TID) | ORAL | 0 refills | Status: DC | PRN

## 2023-02-20 NOTE — ED Triage Notes (Signed)
Pt reports headache and body aches x 2 days;  nausea x 1 day.

## 2023-02-20 NOTE — Discharge Instructions (Addendum)

## 2023-02-20 NOTE — ED Provider Notes (Signed)
[facility]  Note:  This document was prepared using Systems analyst and may include unintentional dictation errors.  MRN: [id number] DOB: 1991/09/12  Subjective:   Whitney Aguirre is a 32 y.o. female presenting for 2-day history of persistent body pains, headaches, nausea without vomiting, diarrhea (had 3 episodes yesterday).  No runny or stuffy nose, throat pain, cough, chest pain, shortness of breath or wheezing, bloody stools. No fever, recent antibiotic use, hospitalizations or long distance travel.  Has not eaten raw foods, drank unfiltered water.  No history of GI disorders including Crohn's, IBS, ulcerative colitis.  Patient is prediabetic, takes metformin and has been on [facility] for weight loss.  No current facility-administered medications for this encounter.  Current Outpatient Medications:    levonorgestrel (MIRENA) 20 MCG/24HR IUD, 1 each by Intrauterine route once. (Patient not taking: Reported on 02/15/2023), Disp: , Rfl:    levothyroxine (SYNTHROID) 150 MCG tablet, Take 150 mcg by mouth daily in the afternoon. 2 tablets Monday- Saturday and 1 tablet on Sunday, Disp: , Rfl:    metFORMIN (GLUCOPHAGE-XR) 500 MG 24 hr tablet, Take 1 tablet (500 mg total) by mouth daily with breakfast., Disp: 30 tablet, Rfl: 0   metoprolol tartrate (LOPRESSOR) 25 MG tablet, Take 0.5 tablets (12.5 mg total) by mouth 2 (two) times daily as needed (for palpitations)., Disp: 30 tablet, Rfl: 4   nortriptyline (PAMELOR) 10 MG capsule, Take 1 capsule (10 mg total) by mouth at bedtime., Disp: 60 capsule, Rfl: 5   nortriptyline (PAMELOR) 50 MG capsule, Take 1 capsule (50 mg total) by mouth at bedtime. Take 50 mg in addition to the 10 mg daily, Disp: 30 capsule, Rfl: 5   ondansetron (ZOFRAN) 8 MG tablet, Take 1 tablet (8 mg total) by mouth daily as needed for nausea., Disp: 15 tablet, Rfl: 0   Phentermine HCl (LOMAIRA) 8 MG TABS, Take 8 mg by mouth daily before  breakfast. (Patient not taking: Reported on 02/15/2023), Disp: , Rfl:    Semaglutide-Weight Management (WEGOVY) 0.5 MG/0.5ML SOAJ, Inject 0.5 mg into the skin once a week. (Patient not taking: Reported on 02/15/2023), Disp: 2 mL, Rfl: 5   Semaglutide-Weight Management (WEGOVY) 0.5 MG/0.5ML SOAJ, Inject 0.5 mg into the skin once a week., Disp: 2 mL, Rfl: 5   WEGOVY 0.25 MG/0.5ML SOAJ, Inject into the skin once a week., Disp: , Rfl:    No Known Allergies  Past Medical History:  Diagnosis Date   ACL tear 05/12/2014   injured on trampoline   Hashimoto's disease 2018   Headache(784.0)    stress   Polycystic ovarian syndrome    no current med.   Thyroid disease      Past Surgical History:  Procedure Laterality Date   KNEE ARTHROSCOPY WITH ANTERIOR CRUCIATE LIGAMENT (ACL) REPAIR Left 06/05/2014   Procedure: LEFT KNEE ARTHROSCOPY WITH ANTERIOR CRUCIATE LIGAMENT (ACL) REPAIR;  Surgeon: Alta Corning, MD;  Location: [facility];  Service: Orthopedics;  Laterality: Left;   WISDOM TOOTH EXTRACTION      Family History  Problem Relation Age of Onset   Cardiomyopathy Mother    High blood pressure Mother    Emphysema Mother    Ulcers Father    Cancer Paternal Grandfather    Diabetes Other    Hypertension Other    Liver disease Neg Hx    Colon cancer Neg Hx    Esophageal cancer Neg Hx     Social History   Tobacco  Use   Smoking status: Never   Smokeless tobacco: Never  Vaping Use   Vaping Use: Never used  Substance Use Topics   Alcohol use: Yes    Comment: rare   Drug use: No    ROS   Objective:   Vitals: BP 128/85 (BP Location: Left Arm)   Pulse 98   Temp 98.1 F (36.7 C) (Oral)   Resp 16   LMP 01/21/2023 (Exact Date)   SpO2 97%   Physical Exam Constitutional:      General: She is not in acute distress.    Appearance: Normal appearance. She is well-developed. She is not ill-appearing, toxic-appearing or diaphoretic.  HENT:     Head: Normocephalic and  atraumatic.     Nose: Nose normal.     Mouth/Throat:     Mouth: Mucous membranes are moist.  Eyes:     General: No scleral icterus.       Right eye: No discharge.        Left eye: No discharge.     Extraocular Movements: Extraocular movements intact.  Cardiovascular:     Rate and Rhythm: Normal rate and regular rhythm.     Heart sounds: Normal heart sounds. No murmur heard.    No friction rub. No gallop.  Pulmonary:     Effort: Pulmonary effort is normal. No respiratory distress.     Breath sounds: No stridor. No wheezing, rhonchi or rales.  Chest:     Chest wall: No tenderness.  Skin:    General: Skin is warm and dry.  Neurological:     General: No focal deficit present.     Mental Status: She is alert and oriented to person, place, and time.  Psychiatric:        Mood and Affect: Mood normal.        Behavior: Behavior normal.    Results for orders placed or performed during the hospital encounter of 02/20/23 (from the past 24 hour(s))  POCT urinalysis dipstick     Status: Abnormal   Collection Time: 02/20/23 11:01 AM  Result Value Ref Range   Color, UA yellow yellow   Clarity, UA clear clear   Glucose, UA negative negative mg/dL   Bilirubin, UA small (A) negative   Ketones, POC UA negative negative mg/dL   Spec Grav, UA 1.025 1.010 - 1.025   Blood, UA trace-intact (A) negative   pH, UA 6.0 5.0 - 8.0   Protein Ur, POC trace (A) negative mg/dL   Urobilinogen, UA 1.0 0.2 or 1.0 E.U./dL   Nitrite, UA Negative Negative   Leukocytes, UA Trace (A) Negative  POCT urine pregnancy     Status: None   Collection Time: 02/20/23 11:13 AM  Result Value Ref Range   Preg Test, Ur Negative Negative  POCT Influenza A/B     Status: None   Collection Time: 02/20/23 11:31 AM  Result Value Ref Range   Influenza A, POC Negative Negative   Influenza B, POC Negative Negative    Assessment and Plan :   PDMP not reviewed this encounter.  1. Acute viral syndrome   2. Nausea without  vomiting   3. Diarrhea, unspecified type     Discussed the possibility of her Wegovy as a primary source of her GI symptoms.  Recommend follow-up with her general practitioner for this.  Otherwise, will manage for viral illness such as viral URI, viral syndrome, viral rhinitis, noninfectious gastroenteritis, colitis. Recommended supportive care. Offered scripts for symptomatic relief.  Patient declined COVID test. Counseled patient on potential for adverse effects with medications prescribed/recommended today, ER and return-to-clinic precautions discussed, patient verbalized understanding.     Jaynee Eagles, [place] 02/20/23 1136

## 2023-02-23 ENCOUNTER — Other Ambulatory Visit (HOSPITAL_COMMUNITY): Payer: Self-pay

## 2023-02-26 ENCOUNTER — Encounter: Payer: Self-pay | Admitting: Gastroenterology

## 2023-02-26 ENCOUNTER — Encounter: Payer: Self-pay | Admitting: Family Medicine

## 2023-02-27 ENCOUNTER — Encounter (INDEPENDENT_AMBULATORY_CARE_PROVIDER_SITE_OTHER): Payer: BC Managed Care – PPO | Admitting: Family Medicine

## 2023-02-27 ENCOUNTER — Other Ambulatory Visit (HOSPITAL_COMMUNITY): Payer: Self-pay

## 2023-02-27 DIAGNOSIS — R1013 Epigastric pain: Secondary | ICD-10-CM

## 2023-02-27 DIAGNOSIS — R1111 Vomiting without nausea: Secondary | ICD-10-CM | POA: Diagnosis not present

## 2023-02-27 DIAGNOSIS — R110 Nausea: Secondary | ICD-10-CM

## 2023-02-27 MED ORDER — PANTOPRAZOLE SODIUM 40 MG PO TBEC: 40.0000 mg | DELAYED_RELEASE_TABLET | Freq: Every day | ORAL | 0 refills | Status: DC

## 2023-02-27 NOTE — Telephone Encounter (Signed)
Please see the MyChart message reply(ies) for my assessment and plan.  The patient gave consent for this Medical Advice Message and is aware that it may result in a bill to their insurance company as well as the possibility that this may result in a co-payment or deductible. They are an established patient, but are not seeking medical advice exclusively about a problem treated during an in person or video visit in the last 7 days. I did not recommend an in person or video visit within 7 days of my reply.  1. Nausea without vomiting  2. Dyspepsia - pantoprazole (PROTONIX) 40 MG tablet; Take 1 tablet (40 mg total) by mouth daily.  Dispense: 60 tablet; Refill: 0  I spent a total of 8 minutes cumulative time within 7 days through MyChart messaging Ronique Simerly Martinique, MD

## 2023-04-11 ENCOUNTER — Other Ambulatory Visit: Payer: Self-pay | Admitting: Family Medicine

## 2023-04-11 DIAGNOSIS — R1013 Epigastric pain: Secondary | ICD-10-CM

## 2023-05-01 ENCOUNTER — Other Ambulatory Visit (HOSPITAL_COMMUNITY): Payer: Self-pay

## 2023-05-01 MED ORDER — WEGOVY 0.5 MG/0.5ML ~~LOC~~ SOAJ
0.5000 mg | SUBCUTANEOUS | 1 refills | Status: DC
  Filled 2023-05-01: qty 2, 28d supply, fill #0

## 2023-05-11 ENCOUNTER — Encounter: Payer: Self-pay | Admitting: Family Medicine

## 2023-05-17 ENCOUNTER — Other Ambulatory Visit (HOSPITAL_COMMUNITY): Payer: Self-pay

## 2023-05-29 ENCOUNTER — Other Ambulatory Visit (HOSPITAL_COMMUNITY): Payer: Self-pay

## 2023-06-18 ENCOUNTER — Encounter: Payer: Self-pay | Admitting: Cardiovascular Disease

## 2023-06-18 ENCOUNTER — Ambulatory Visit: Payer: BC Managed Care – PPO | Attending: Cardiovascular Disease | Admitting: Cardiovascular Disease

## 2023-06-18 VITALS — BP 133/79 | HR 54 | Ht 67.0 in | Wt 269.4 lb

## 2023-06-18 DIAGNOSIS — I493 Ventricular premature depolarization: Secondary | ICD-10-CM | POA: Diagnosis not present

## 2023-06-18 DIAGNOSIS — R002 Palpitations: Secondary | ICD-10-CM | POA: Diagnosis not present

## 2023-06-18 DIAGNOSIS — Z8249 Family history of ischemic heart disease and other diseases of the circulatory system: Secondary | ICD-10-CM

## 2023-06-18 NOTE — Patient Instructions (Signed)
Medication Instructions:  Your physician recommends that you continue on your current medications as directed. Please refer to the Current Medication list given to you today.  *If you need a refill on your cardiac medications before your next appointment, please call your pharmacy*  Follow-Up: At [facility], you and your health needs are our priority.  As part of our continuing mission to provide you with exceptional heart care, we have created designated Provider Care Teams.  These Care Teams include your primary Cardiologist (physician) and Advanced Practice Providers (APPs -  Physician Assistants and Nurse Practitioners) who all work together to provide you with the care you need, when you need it.  Your next appointment:   As needed with Dr. Royann Shivers

## 2023-06-18 NOTE — Progress Notes (Signed)
Cardiology Office Note:    Date:  06/18/2023   ID:  Whitney Aguirre, DOB 22-Aug-1991, MRN [id number]  PCP:  [place], Betty G, MD   [facility] Providers Cardiologist:  Thurmon Fair, MD     Referring MD: [place], Betty G, MD   Chief Complaint  Patient presents with   Palpitations  Whitney Aguirre is a 32 y.o. female who is being seen today for the evaluation of arrhythmia and family history of hypertrophic cardiomyopathy.  History of Present Illness:    Whitney Aguirre is a 32 y.o. female with a family history of hypertrophic cardiomyopathy (mother has a defibrillator and sees Dr. Graciela Husbands) who has recently been troubled by increasingly frequent PVCs.  She captured these on her smart watch on several occasions including most recently in May 2023, which shows 6 monomorphic PVCs on a 30" strip.  The patient is seen for evaluation here last year when her father was passing away from cancer.  They have now subsided significantly.  She took metoprolol a few times earlier in 2023, but has not taken it now in several weeks.  The palpitations are most noticeable when she lies in bed at night just before falling asleep.  Did not associate dizziness, angina, syncope, severe dyspnea.  She has a brief sensation of her breath being interrupted.  She does not have exertional chest pain or shortness of breath, denies edema, claudication, focal neurological complaints.  Her mother has HCM but did not undergo genetic testing since it was not covered by insurance and was expensive.  She had echocardiograms performed in 2013, 2018 and 2023, none of which showed evidence of left ventricular hypertrophy or other structural abnormalities.  Additional problems include morbid obesity (BMI 41), polycystic ovarian syndrome, treated autoimmune hypothyroidism, impaired glucose tolerance without diabetes, anxiety and major depression.  Reginal Lutes has helped with weight loss, but is intermittently unavailable  and then she often will gain much of the weight back.  Her most recent TSH was performed more than a year ago and was borderline suppressed at 0.240.  She is scheduled for a repeat TSH with her primary care provider soon.  As far as the patient knows, her mother has never received appropriate treatment for VT from her ICD and has not had cardiac arrest (device implanted for primary prevention).  Past Medical History:  Diagnosis Date   ACL tear 05/12/2014   injured on trampoline   Hashimoto's disease 2018   Headache(784.0)    stress   Polycystic ovarian syndrome    no current med.   Thyroid disease     Past Surgical History:  Procedure Laterality Date   KNEE ARTHROSCOPY WITH ANTERIOR CRUCIATE LIGAMENT (ACL) REPAIR Left 06/05/2014   Procedure: LEFT KNEE ARTHROSCOPY WITH ANTERIOR CRUCIATE LIGAMENT (ACL) REPAIR;  Surgeon: Harvie Junior, MD;  Location: [facility];  Service: Orthopedics;  Laterality: Left;   WISDOM TOOTH EXTRACTION      Current Medications: Current Meds  Medication Sig   levothyroxine (SYNTHROID) 150 MCG tablet Take 150 mcg by mouth daily in the afternoon. 2 tablets Monday- Saturday and 1 tablet on Sunday   metFORMIN (GLUCOPHAGE-XR) 500 MG 24 hr tablet Take 1 tablet (500 mg total) by mouth daily with breakfast. (Patient taking differently: Take 1,000 mg by mouth daily with breakfast.)   norethindrone-ethinyl estradiol-FE (LOESTRIN FE) 1-20 MG-MCG tablet Take 1 tablet by mouth daily.   nortriptyline (PAMELOR) 10 MG capsule Take 1 capsule (10 mg total) by mouth  at bedtime.   nortriptyline (PAMELOR) 50 MG capsule Take 1 capsule (50 mg total) by mouth at bedtime. Take 50 mg in addition to the 10 mg daily   pantoprazole (PROTONIX) 40 MG tablet TAKE 1 TABLET BY MOUTH EVERY DAY   Semaglutide-Weight Management (WEGOVY) 0.5 MG/0.5ML SOAJ Inject 0.5 mg into the skin once a week.     Allergies:   Patient has no known allergies.   Social History   Socioeconomic  History   Marital status: Married    Spouse name: Not on file   Number of children: 0   Years of education: College/RN   Highest education level: Not on file  Occupational History    Employer: [employer]    Comment: ED-RN  Tobacco Use   Smoking status: Never   Smokeless tobacco: Never  Vaping Use   Vaping status: Never Used  Substance and Sexual Activity   Alcohol use: Yes    Comment: rare   Drug use: No   Sexual activity: Yes  Other Topics Concern   Not on file  Social History Narrative   Patient lives at home with family.   Caffeine Use:1-2 cups   Right handed   Social Determinants of Health   Financial Resource Strain: Not on file  Food Insecurity: Food Insecurity Present (06/14/2021)   Hunger Vital Sign    Worried About Running Out of Food in the Last Year: Sometimes true    Ran Out of Food in the Last Year: Never true  Transportation Needs: Not on file  Physical Activity: Not on file  Stress: Not on file  Social Connections: Not on file     Family History: The patient's family history includes Cancer in her paternal grandfather; Cardiomyopathy in her mother; Diabetes in an other family member; Emphysema in her mother; High blood pressure in her mother; Hypertension in an other family member; Ulcers in her father. There is no history of Liver disease, Colon cancer, or Esophageal cancer.  ROS:   Please see the history of present illness.     All other systems reviewed and are negative.  EKGs/Labs/Other Studies Reviewed:    The following studies were reviewed today: ECHO 06/30/2023   1. Left ventricular ejection fraction, by estimation, is 60 to 65%. The  left ventricle has normal function. The left ventricle has no regional  wall motion abnormalities. Left ventricular diastolic parameters were  normal.   2. Right ventricular systolic function is normal. The right ventricular  size is normal.   3. The mitral valve is normal in structure. Trivial mitral valve   regurgitation. No evidence of mitral stenosis.   4. The aortic valve is normal in structure. Aortic valve regurgitation is  not visualized. No aortic stenosis is present.   5. The inferior vena cava is normal in size with greater than 50%  respiratory variability, suggesting right atrial pressure of 3 mmHg.   EKG:  EKG is ordered today.  Shows normal sinus rhythm, completely normal tracing, normal QT.  No evidence of LVH  Recent Labs: No results found for requested labs within last 365 days.  12/20/2021 Hemoglobin A1c 5.5%, TSH 0.24 Recent Lipid Panel No results found for: "CHOL", "TRIG", "HDL", "CHOLHDL", "VLDL", "LDLCALC", "LDLDIRECT" 03/10/2021 HDL 30, LDL 50, triglycerides 296  Risk Assessment/Calculations:           Physical Exam:    VS:  BP 133/79   Pulse (!) 54   Ht 5\' 7"  (1.702 m)   Wt 269  lb 6.4 oz (122.2 kg)   SpO2 99%   BMI 42.19 kg/m     Wt Readings from Last 3 Encounters:  06/18/23 269 lb 6.4 oz (122.2 kg)  02/15/23 262 lb (118.8 kg)  10/16/22 256 lb (116.1 kg)     General: Alert, oriented x3, no distress, morbidly obese Head: no evidence of trauma, PERRL, EOMI, no exophtalmos or lid lag, no myxedema, no xanthelasma; normal ears, nose and oropharynx Neck: normal jugular venous pulsations and no hepatojugular reflux; brisk carotid pulses without delay and no carotid bruits Chest: clear to auscultation, no signs of consolidation by percussion or palpation, normal fremitus, symmetrical and full respiratory excursions Cardiovascular: normal position and quality of the apical impulse, regular rhythm, normal first and second heart sounds, no murmurs, rubs or gallops Abdomen: no tenderness or distention, no masses by palpation, no abnormal pulsatility or arterial bruits, normal bowel sounds, no hepatosplenomegaly Extremities: no clubbing, cyanosis or edema; 2+ radial, ulnar and brachial pulses bilaterally; 2+ right femoral, posterior tibial and dorsalis pedis pulses;  2+ left femoral, posterior tibial and dorsalis pedis pulses; no subclavian or femoral bruits Neurological: grossly nonfocal Psych: Normal mood and affect   ASSESSMENT:    1. Palpitations   2. PVCs (premature ventricular contractions)   3. Family history of cardiomyopathy     PLAN:    In order of problems listed above:  PVCs: Monomorphic, likely adrenergically-driven, greatly reduced in frequency.  Monitoring with smart watch.  Avoid stimulants such as caffeine.  Occasionally takes beta-blockers.  In the absence of structural cardiac abnormalities are very likely to be benign. FHx HCM: No indication of LVH or other signs of cardiomyopathy on her recent echocardiogram.  Plan to repeat everything over the years or so. Hypothyroidism: Most recent TSH was at the low end of normal.  Appears clinically euthyroid. PreDM: Most recent hemoglobin A1c is actually normal, but metformin is also useful for her PCOS.  Due for repeat labs. Obesity: Reginal Lutes helps and is generally well-tolerated, but unfortunately frequently unavailable.           Medication Adjustments/Labs and Tests Ordered: Current medicines are reviewed at length with the patient today.  Concerns regarding medicines are outlined above.  Orders Placed This Encounter  Procedures   EKG 12-Lead   No orders of the defined types were placed in this encounter.   Patient Instructions  Medication Instructions:  Your physician recommends that you continue on your current medications as directed. Please refer to the Current Medication list given to you today.  *If you need a refill on your cardiac medications before your next appointment, please call your pharmacy*  Follow-Up: At [facility], you and your health needs are our priority.  As part of our continuing mission to provide you with exceptional heart care, we have created designated Provider Care Teams.  These Care Teams include your primary Cardiologist (physician)  and Advanced Practice Providers (APPs -  Physician Assistants and Nurse Practitioners) who all work together to provide you with the care you need, when you need it.  Your next appointment:   As needed with Dr. Royann Shivers   Signed, Thurmon Fair, MD  06/18/2023 8:43 AM    [facility]

## 2023-06-28 ENCOUNTER — Ambulatory Visit
Admission: EM | Admit: 2023-06-28 | Discharge: 2023-06-28 | Disposition: A | Discharge status: Home / Self Care | Payer: BC Managed Care – PPO | Attending: Emergency Medicine | Admitting: Emergency Medicine

## 2023-06-28 ENCOUNTER — Other Ambulatory Visit: Payer: Self-pay | Admitting: Neurology

## 2023-06-28 DIAGNOSIS — B349 Viral infection, unspecified: Secondary | ICD-10-CM

## 2023-06-28 LAB — POCT INFLUENZA A/B
Influenza A, POC: NEGATIVE
Influenza B, POC: NEGATIVE

## 2023-06-28 MED ORDER — AZELASTINE-FLUTICASONE 137-50 MCG/ACT NA SUSP: 1.0000 | Freq: Two times a day (BID) | NASAL | 2 refills | Status: DC

## 2023-06-28 NOTE — ED Triage Notes (Signed)
Pt presents to UC w/ c/o sore throat yesterday, body aches, head congestion.

## 2023-06-28 NOTE — Discharge Instructions (Addendum)
Your rapid influenza antigen test today was negative.  No further influenza testing is indicated.  While your physical exam findings are not concerning for COVID-19 at this time, it would not be unreasonable for you to take a home COVID-19 test today and may be again in the next 3 days if today's test is negative.  I am sorry that we are not able to offer rapid COVID-19 testing at this location.  Conservative care is recommended with rest, drinking plenty of clear fluids, eating only when hungry, taking supportive medications for your symptoms and avoiding being around other people.  Please remain at home until you are fever free for 24 hours without the use of antifever medications such as Tylenol and ibuprofen.   Please read below to learn more about the medications, dosages and frequencies that I recommend to help alleviate your symptoms and to get you feeling better soon:   Dymista (fluticasone and azelastine): This is a combination nasal spray that contains both a nasal steroid and nasal antihistamine.  Please use 1 spray in each nare twice daily or every 12 hours.  This medication works best when it is used regularly, not "as needed".  You may find that it takes a few days for this to reach full effectiveness, please be patient with his onboarding process.  The most common side effect of this medication is nosebleeds.  Please discontinue use for 1 week if this occurs, then resume.    Advil, Motrin (ibuprofen): This is a good anti-inflammatory medication which addresses aches, pains and inflammation of the upper airways that causes sinus and nasal congestion as well as in the lower airways which makes your cough feel tight and sometimes burn.  I recommend that you take between 400 to 600 mg every 6-8 hours as needed.  Please do not take more than 2400 mg of ibuprofen in a 24-hour period and please do not take high doses of ibuprofen for more than 3 days in a row as this can lead to stomach ulcers.    As we discussed, here is a selection of the over-the-counter multisymptom medications that I recommend.  Please choose 1 for daytime and 1 for nighttime.  These medications can be taken with ibuprofen.  Please avoid taking these medications with other allergy medications.  Please calculate the amount of acetaminophen you are taking a 24-hour period to make sure that you do not exceed 3000 mg.  NyQuil Severe Cold and Flu Liquid, NyQuil VapoCOOL Caplets / Tylenol Cold + Flu + Cough Nighttime Liquid: These are identical over-the-counter multisymptom medications that contain a fever/pain reliever, acetaminophen 650 mg, a cough suppressant, dextromethorphan 30 mg, and an antihistamine commonly found and sleep medications such as Unisom and Sleep-Aid that dries out mucus production and helps you sleep, doxylamine 12.5 mg.  Please do not take this medication along with other cough suppressants with the letters "DM" or with other antihistamines such as Benadryl, Zyrtec, Xyzal, Allegra or Claritin to avoid overdose.  Please be careful when taking this medication along with other medications containing acetaminophen or Tylenol.  The maximum dose of Tylenol in a 24-hour period is 3000 mg, taking more than 3000 mg can damage your liver.   DayQuil Cough DM Plus Congestion liquid:  This is an over-the-counter multisymptom preparation that contains a cough suppressant, dextromethorphan 20 mg, an expectorant which loosens congestion to make coughing easier, guaifenesin 400 mg, and a decongestant intended to decrease mucous production, phenylephrine 10 mg.  Please keep in  mind that the FDA recently stated that phenylephrine has been shown to be ineffective.  My personal recommendation is to avoid combination medications such as this one and to instead choose your own combination of the single symptom relievers listed in this summary.     Alka-Seltzer Plus Severe Cold and Cough: This is an over-the-counter multisymptom  medication that contains a fever/pain reliever, aspirin 650 mg, a cough suppressant, dextromethorphan 20 mg, an antihistamine that dries out mucus production and helps you sleep, chlorpheniramine 4 mg and a decongestant intended to decrease mucous production, phenylephrine 15.6 mg.  Please do not take this medication along with other cough suppressants with the letters "DM" or with other antihistamines such as Benadryl, Zyrtec, Xyzal, Allegra or Claritin to avoid antihistamine overdose.  Please do not give this medication to children with fever because it contains aspirin.  Aspirin can cause Reye syndrome which causes seizures and permanent brain damage.   TheraFlu Nighttime Flu Relief Max Strength Syrup:  This is an over-the-counter multisymptom medication that contains a fever/pain reliever, acetaminophen 1000 mg, a cough suppressant, dextromethorphan 30 mg, and an antihistamine that dries out mucus production and helps you sleep, chlorpheniramine 4 mg.  Please do not take this medication along with other cough suppressants with the letters "DM" or with other antihistamines such as Benadryl, Zyrtec, Xyzal, Allegra or Claritin to avoid antihistamine overdose.  Please be careful when taking this medication along with other medications containing acetaminophen or Tylenol.  The maximum dose of Tylenol in a 24-hour period is 3000 mg, taking more than 3000 mg can damage your liver.   TheraFlu Dayttime Flu Relief Max Strength Syrup:  This is an over-the-counter dual symptom medication that contains a fever/pain reliever, acetaminophen 1000 mg and a cough suppressant, dextromethorphan 30 mg.  Please do not take this medication along with other cough suppressants with the letters "DM" or with other antihistamines such as Benadryl, Zyrtec, Xyzal, Allegra or Claritin to avoid antihistamine overdose.  Please be careful when taking this medication along with other medications containing acetaminophen or Tylenol.  The  maximum dose of Tylenol in a 24-hour period is 3000 mg, taking more than 3000 mg can damage your liver.   If symptoms have not meaningfully improved in the next 5 to 7 days, please return for repeat evaluation or follow-up with your regular provider.  If symptoms have worsened in the next 3 to 5 days, please return for repeat evaluation or follow-up with your regular provider.    Thank you for visiting urgent care today.  We appreciate the opportunity to participate in your care.

## 2023-06-28 NOTE — ED Provider Notes (Signed)
[facility]    CSN: [id number] Arrival date & time: 06/28/23  0941    HISTORY  No chief complaint on file.  HPI Whitney Aguirre is a pleasant, 32 y.o. female who presents to urgent care today. Patient complains of a 1 day history of sore throat, hoarseness of voice, nonproductive cough, headache, sinus pain and pressure, body aches.  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  Patient denies fever, nausea, vomiting, diarrhea.  The history is provided by the patient.   Past Medical History:  Diagnosis Date   ACL tear 05/12/2014   injured on trampoline   Hashimoto's disease 2018   Headache(784.0)    stress   Polycystic ovarian syndrome    no current med.   Thyroid disease    Patient Active Problem List   Diagnosis Date Noted   Family history of cardiomyopathy 05/28/2022   Depression, major, single episode, moderate (HCC) 01/26/2021   Anxiety disorder, unspecified 01/26/2021   Chronic nausea 06/28/2020   Headache, unspecified headache type 01/28/2019   Morbid obesity (HCC) 09/04/2018   GERD (gastroesophageal reflux disease) 04/02/2018   Acquired hypothyroidism 10/04/2016   Insomnia disorder 09/07/2016   Allergic rhinitis 08/29/2016   Chest pain 01/04/2012   Palpitations 01/04/2012   Past Surgical History:  Procedure Laterality Date   KNEE ARTHROSCOPY WITH ANTERIOR CRUCIATE LIGAMENT (ACL) REPAIR Left 06/05/2014   Procedure: LEFT KNEE ARTHROSCOPY WITH ANTERIOR CRUCIATE LIGAMENT (ACL) REPAIR;  Surgeon: Harvie Junior, MD;  Location: [facility];  Service: Orthopedics;  Laterality: Left;   WISDOM TOOTH EXTRACTION     OB History   No obstetric history on file.    Home Medications    Prior to Admission medications   Medication Sig Start Date End Date Taking? Authorizing Provider  levothyroxine (SYNTHROID) 150 MCG tablet Take 150 mcg by mouth daily  in the afternoon. 2 tablets Monday- Saturday and 1 tablet on Sunday    [provider]  metFORMIN (GLUCOPHAGE-XR) 500 MG 24 hr tablet Take 1 tablet (500 mg total) by mouth daily with breakfast. Patient taking differently: Take 1,000 mg by mouth daily with breakfast. 06/16/20   Nelwyn Salisbury, MD  metoprolol tartrate (LOPRESSOR) 25 MG tablet Take 0.5 tablets (12.5 mg total) by mouth 2 (two) times daily as needed (for palpitations). Patient not taking: Reported on 06/18/2023 05/25/22 12/21/22  Croitoru, Mihai, MD  norethindrone-ethinyl estradiol-FE (LOESTRIN FE) 1-20 MG-MCG tablet Take 1 tablet by mouth daily. 05/28/23   [provider]  nortriptyline (PAMELOR) 10 MG capsule Take 1 capsule (10 mg total) by mouth at bedtime. 06/20/22   Drema Dallas, DO  nortriptyline (PAMELOR) 50 MG capsule Take 1 capsule (50 mg total) by mouth at bedtime. Take 50 mg in addition to the 10 mg daily 06/20/22   Shon Millet R, DO  ondansetron (ZOFRAN-ODT) 8 MG disintegrating tablet Take 1 tablet (8 mg total) by mouth every 8 (eight) hours as needed for nausea or vomiting. Patient not taking: Reported on 06/18/2023 02/20/23   Wallis Bamberg, PA-C  pantoprazole (PROTONIX) 40 MG tablet TAKE 1 TABLET BY MOUTH EVERY DAY 04/11/23   [place], Betty G, MD  Semaglutide-Weight Management [facility]) 0.5 MG/0.5ML SOAJ Inject 0.5 mg into the skin once a week. 05/01/23       Family History Family History  Problem Relation Age of Onset   Cardiomyopathy Mother  High blood pressure Mother    Emphysema Mother    Ulcers Father    Cancer Paternal Grandfather    Diabetes Other    Hypertension Other    Liver disease Neg Hx    Colon cancer Neg Hx    Esophageal cancer Neg Hx    Social History Social History   Tobacco Use   Smoking status: Never   Smokeless tobacco: Never  Vaping Use   Vaping status: Never Used  Substance Use Topics   Alcohol use: Yes    Comment: rare   Drug use: No   Allergies   Patient has no known  allergies.  Review of Systems Review of Systems Pertinent findings revealed after performing a 14 point review of systems has been noted in the history of present illness.  Physical Exam Vital Signs BP 118/76 (BP Location: Right Arm)   Pulse 69   Temp 98.3 F (36.8 C) (Oral)   Resp 16   SpO2 98%   No data found.  Physical Exam Constitutional:      Appearance: She is ill-appearing.  HENT:     Head: Normocephalic and atraumatic.     Salivary Glands: Right salivary gland is not diffusely enlarged or tender. Left salivary gland is not diffusely enlarged or tender.     Right Ear: Tympanic membrane, ear canal and external ear normal.     Left Ear: Tympanic membrane, ear canal and external ear normal.     Nose: Congestion and rhinorrhea present. Rhinorrhea is clear.     Right Sinus: No maxillary sinus tenderness or frontal sinus tenderness.     Left Sinus: No maxillary sinus tenderness.     Mouth/Throat:     Mouth: Mucous membranes are moist.     Pharynx: Pharyngeal swelling, posterior oropharyngeal erythema and uvula swelling present.     Tonsils: No tonsillar exudate. 0 on the right. 0 on the left.  Cardiovascular:     Rate and Rhythm: Normal rate and regular rhythm.     Pulses: Normal pulses.  Pulmonary:     Effort: Pulmonary effort is normal. No accessory muscle usage, prolonged expiration or respiratory distress.     Breath sounds: No stridor. No wheezing, rhonchi or rales.     Comments: Turbulent breath sounds throughout without wheeze, rale, rhonchi. Abdominal:     General: Abdomen is flat. Bowel sounds are normal.     Palpations: Abdomen is soft.  Musculoskeletal:        General: Normal range of motion.     Cervical back: Full passive range of motion without pain, normal range of motion and neck supple.  Lymphadenopathy:     Cervical: Cervical adenopathy present.     Right cervical: Superficial cervical adenopathy and posterior cervical adenopathy present.     Left  cervical: Superficial cervical adenopathy and posterior cervical adenopathy present.  Skin:    General: Skin is warm and dry.  Neurological:     General: No focal deficit present.     Mental Status: She is alert and oriented to person, place, and time.     Motor: Motor function is intact.     Coordination: Coordination is intact.     Gait: Gait is intact.     Deep Tendon Reflexes: Reflexes are normal and symmetric.  Psychiatric:        Attention and Perception: Attention and perception normal.        Mood and Affect: Mood and affect normal.  Speech: Speech normal.        Behavior: Behavior normal. Behavior is cooperative.        Thought Content: Thought content normal.     Visual Acuity Right Eye Distance:   Left Eye Distance:   Bilateral Distance:    Right Eye Near:   Left Eye Near:    Bilateral Near:     UC Couse / Diagnostics / Procedures:     Radiology No results found.  Procedures Procedures (including critical care time) EKG  Pending results:  Labs Reviewed  POCT INFLUENZA A/B    Medications Ordered in UC: Medications - No data to display  UC Diagnoses / Final Clinical Impressions(s)   I have reviewed the triage vital signs and the nursing notes.  Pertinent labs & imaging results that were available during my care of the patient were reviewed by me and considered in my medical decision making (see chart for details).    Final diagnoses:  Viral illness   Patient advised of negative influenza test.  Conservative care recommended with rest, drinking plenty of clear fluids and anti-inflammatory medication including Dymista and ibuprofen.  Return precautions advised.  Please see discharge instructions below for details of plan of care as provided to patient. ED Prescriptions     Medication Sig Dispense Auth. Provider   Azelastine-Fluticasone (DYMISTA) 137-50 MCG/ACT SUSP Place 1 spray into the nose every 12 (twelve) hours. 23 g Theadora Rama  Scales, PA-C      PDMP not reviewed this encounter.  Pending results:  Labs Reviewed  POCT INFLUENZA A/B    Discharge Instructions:   Discharge Instructions      Your rapid influenza antigen test today was negative.  No further influenza testing is indicated.  While your physical exam findings are not concerning for COVID-19 at this time, it would not be unreasonable for you to take a home COVID-19 test today and may be again in the next 3 days if today's test is negative.  I am sorry that we are not able to offer rapid COVID-19 testing at this location.  Conservative care is recommended with rest, drinking plenty of clear fluids, eating only when hungry, taking supportive medications for your symptoms and avoiding being around other people.  Please remain at home until you are fever free for 24 hours without the use of antifever medications such as Tylenol and ibuprofen.   Please read below to learn more about the medications, dosages and frequencies that I recommend to help alleviate your symptoms and to get you feeling better soon:   Dymista (fluticasone and azelastine): This is a combination nasal spray that contains both a nasal steroid and nasal antihistamine.  Please use 1 spray in each nare twice daily or every 12 hours.  This medication works best when it is used regularly, not "as needed".  You may find that it takes a few days for this to reach full effectiveness, please be patient with his onboarding process.  The most common side effect of this medication is nosebleeds.  Please discontinue use for 1 week if this occurs, then resume.    Advil, Motrin (ibuprofen): This is a good anti-inflammatory medication which addresses aches, pains and inflammation of the upper airways that causes sinus and nasal congestion as well as in the lower airways which makes your cough feel tight and sometimes burn.  I recommend that you take between 400 to 600 mg every 6-8 hours as needed.  Please do  not take more  than 2400 mg of ibuprofen in a 24-hour period and please do not take high doses of ibuprofen for more than 3 days in a row as this can lead to stomach ulcers.   As we discussed, here is a selection of the over-the-counter multisymptom medications that I recommend.  Please choose 1 for daytime and 1 for nighttime.  These medications can be taken with ibuprofen.  Please avoid taking these medications with other allergy medications.  Please calculate the amount of acetaminophen you are taking a 24-hour period to make sure that you do not exceed 3000 mg.  NyQuil Severe Cold and Flu Liquid, NyQuil VapoCOOL Caplets / Tylenol Cold + Flu + Cough Nighttime Liquid: These are identical over-the-counter multisymptom medications that contain a fever/pain reliever, acetaminophen 650 mg, a cough suppressant, dextromethorphan 30 mg, and an antihistamine commonly found and sleep medications such as Unisom and Sleep-Aid that dries out mucus production and helps you sleep, doxylamine 12.5 mg.  Please do not take this medication along with other cough suppressants with the letters "DM" or with other antihistamines such as Benadryl, Zyrtec, Xyzal, Allegra or Claritin to avoid overdose.  Please be careful when taking this medication along with other medications containing acetaminophen or Tylenol.  The maximum dose of Tylenol in a 24-hour period is 3000 mg, taking more than 3000 mg can damage your liver.   DayQuil Cough DM Plus Congestion liquid:  This is an over-the-counter multisymptom preparation that contains a cough suppressant, dextromethorphan 20 mg, an expectorant which loosens congestion to make coughing easier, guaifenesin 400 mg, and a decongestant intended to decrease mucous production, phenylephrine 10 mg.  Please keep in mind that the FDA recently stated that phenylephrine has been shown to be ineffective.  My personal recommendation is to avoid combination medications such as this one and to instead  choose your own combination of the single symptom relievers listed in this summary.     Alka-Seltzer Plus Severe Cold and Cough: This is an over-the-counter multisymptom medication that contains a fever/pain reliever, aspirin 650 mg, a cough suppressant, dextromethorphan 20 mg, an antihistamine that dries out mucus production and helps you sleep, chlorpheniramine 4 mg and a decongestant intended to decrease mucous production, phenylephrine 15.6 mg.  Please do not take this medication along with other cough suppressants with the letters "DM" or with other antihistamines such as Benadryl, Zyrtec, Xyzal, Allegra or Claritin to avoid antihistamine overdose.  Please do not give this medication to children with fever because it contains aspirin.  Aspirin can cause Reye syndrome which causes seizures and permanent brain damage.   TheraFlu Nighttime Flu Relief Max Strength Syrup:  This is an over-the-counter multisymptom medication that contains a fever/pain reliever, acetaminophen 1000 mg, a cough suppressant, dextromethorphan 30 mg, and an antihistamine that dries out mucus production and helps you sleep, chlorpheniramine 4 mg.  Please do not take this medication along with other cough suppressants with the letters "DM" or with other antihistamines such as Benadryl, Zyrtec, Xyzal, Allegra or Claritin to avoid antihistamine overdose.  Please be careful when taking this medication along with other medications containing acetaminophen or Tylenol.  The maximum dose of Tylenol in a 24-hour period is 3000 mg, taking more than 3000 mg can damage your liver.   TheraFlu Dayttime Flu Relief Max Strength Syrup:  This is an over-the-counter dual symptom medication that contains a fever/pain reliever, acetaminophen 1000 mg and a cough suppressant, dextromethorphan 30 mg.  Please do not take this medication along with other  cough suppressants with the letters "DM" or with other antihistamines such as Benadryl, Zyrtec, Xyzal,  Allegra or Claritin to avoid antihistamine overdose.  Please be careful when taking this medication along with other medications containing acetaminophen or Tylenol.  The maximum dose of Tylenol in a 24-hour period is 3000 mg, taking more than 3000 mg can damage your liver.   If symptoms have not meaningfully improved in the next 5 to 7 days, please return for repeat evaluation or follow-up with your regular provider.  If symptoms have worsened in the next 3 to 5 days, please return for repeat evaluation or follow-up with your regular provider.    Thank you for visiting urgent care today.  We appreciate the opportunity to participate in your care.       Disposition Upon Discharge:  Condition: stable for discharge home  Patient presented with an acute illness with associated systemic symptoms and significant discomfort requiring urgent management. In my opinion, this is a condition that a prudent lay person (someone who possesses an average knowledge of health and medicine) may potentially expect to result in complications if not addressed urgently such as respiratory distress, impairment of bodily function or dysfunction of bodily organs.   Routine symptom specific, illness specific and/or disease specific instructions were discussed with the patient and/or caregiver at length.   As such, the patient has been evaluated and assessed, work-up was performed and treatment was provided in alignment with urgent care protocols and evidence based medicine.  Patient/parent/caregiver has been advised that the patient may require follow up for further testing and treatment if the symptoms continue in spite of treatment, as clinically indicated and appropriate.  Patient/parent/caregiver has been advised to return to the [facility] or PCP if no better; to PCP or the Emergency Department if new signs and symptoms develop, or if the current signs or symptoms continue to change or worsen for further workup, evaluation  and treatment as clinically indicated and appropriate  The patient will follow up with their current PCP if and as advised. If the patient does not currently have a PCP we will assist them in obtaining one.   The patient may need specialty follow up if the symptoms continue, in spite of conservative treatment and management, for further workup, evaluation, consultation and treatment as clinically indicated and appropriate.  Patient/parent/caregiver verbalized understanding and agreement of plan as discussed.  All questions were addressed during visit.  Please see discharge instructions below for further details of plan.  This office note has been dictated using Teaching laboratory technician.  Unfortunately, this method of dictation can sometimes lead to typographical or grammatical errors.  I apologize for your inconvenience in advance if this occurs.  Please do not hesitate to reach out to me if clarification is needed.      Theadora Rama Scales, [place] 06/29/23 [phone]

## 2023-07-13 ENCOUNTER — Encounter: Payer: Self-pay | Admitting: Adult Health

## 2023-07-13 ENCOUNTER — Ambulatory Visit: Payer: BC Managed Care – PPO | Admitting: Adult Health

## 2023-07-13 VITALS — BP 130/84 | HR 96 | Temp 98.1°F | Ht 67.0 in | Wt 265.2 lb

## 2023-07-13 DIAGNOSIS — G8929 Other chronic pain: Secondary | ICD-10-CM

## 2023-07-13 DIAGNOSIS — M5440 Lumbago with sciatica, unspecified side: Secondary | ICD-10-CM | POA: Diagnosis not present

## 2023-07-13 DIAGNOSIS — R519 Headache, unspecified: Secondary | ICD-10-CM

## 2023-07-13 MED ORDER — CYCLOBENZAPRINE HCL 10 MG PO TABS: 10.0000 mg | ORAL_TABLET | Freq: Every day | ORAL | 0 refills | Status: DC

## 2023-07-13 MED ORDER — METHYLPREDNISOLONE 4 MG PO TBPK: ORAL_TABLET | ORAL | 0 refills | Status: DC

## 2023-07-13 NOTE — Progress Notes (Signed)
Subjective:    Patient ID: Meta Hatchet, female    DOB: [birth year], 32 y.o.   MRN: [id number]  HPI  32 year old female who  has a past medical history of ACL tear (05/12/2014), Hashimoto's disease (2018), Headache(784.0), Polycystic ovarian syndrome, and Thyroid disease.  She is a patient of Dr. Swaziland who I am seeing today for multiple issues.   She has a family history of hypertension and is concerned that she may have hypertension. She reports that she feels a significant  pressure in her head when she gets up from a sitting position.  Her symptoms started 5 days ago and lasted about 3 days.  She has not had any symptoms over the last 2 days.  She denies any vision changes, fevers, chills, sinus issues.  She has been trying to stay more hydrated.  Additionally, she reports that for the last few years she has been experiencing intermittent episodes of low back pain with radiating pain down her legs.  Pain feels as though it is coming from midline, and she is unsure of which leg the pain radiates down.  Pain is worse with standing.  Pain is not as significant when sitting or walking.  Review of Systems See HPI   Past Medical History:  Diagnosis Date   ACL tear 05/12/2014   injured on trampoline   Hashimoto's disease 2018   Headache(784.0)    stress   Polycystic ovarian syndrome    no current med.   Thyroid disease     Social History   Socioeconomic History   Marital status: Married    Spouse name: Not on file   Number of children: 0   Years of education: College/RN   Highest education level: Not on file  Occupational History    Employer: [employer]    Comment: ED-RN  Tobacco Use   Smoking status: Never   Smokeless tobacco: Never  Vaping Use   Vaping status: Never Used  Substance and Sexual Activity   Alcohol use: Yes    Comment: rare   Drug use: No   Sexual activity: Yes  Other Topics Concern   Not on file  Social History Narrative   Patient lives at  home with family.   Caffeine Use:1-2 cups   Right handed   Social Determinants of Health   Financial Resource Strain: Not on file  Food Insecurity: Food Insecurity Present (06/14/2021)   Hunger Vital Sign    Worried About Running Out of Food in the Last Year: Sometimes true    Ran Out of Food in the Last Year: Never true  Transportation Needs: Not on file  Physical Activity: Not on file  Stress: Not on file  Social Connections: Not on file  Intimate Partner Violence: Not on file    Past Surgical History:  Procedure Laterality Date   KNEE ARTHROSCOPY WITH ANTERIOR CRUCIATE LIGAMENT (ACL) REPAIR Left 06/05/2014   Procedure: LEFT KNEE ARTHROSCOPY WITH ANTERIOR CRUCIATE LIGAMENT (ACL) REPAIR;  Surgeon: Harvie Junior, MD;  [facility];  Service: Orthopedics;  Laterality: Left;   WISDOM TOOTH EXTRACTION      Family History  Problem Relation Age of Onset   Cardiomyopathy Mother    High blood pressure Mother    Emphysema Mother    Ulcers Father    Cancer Paternal Grandfather    Diabetes Other    Hypertension Other    Liver disease Neg Hx    Colon cancer Neg Hx  Esophageal cancer Neg Hx     No Known Allergies  Current Outpatient Medications on File Prior to Visit  Medication Sig Dispense Refill   levothyroxine (SYNTHROID) 150 MCG tablet Take 150 mcg by mouth daily in the afternoon. 2 tablets Monday- Saturday and 1 tablet on Sunday     metFORMIN (GLUCOPHAGE-XR) 500 MG 24 hr tablet Take 1 tablet (500 mg total) by mouth daily with breakfast. (Patient taking differently: Take 1,000 mg by mouth daily with breakfast.) 30 tablet 0   norethindrone-ethinyl estradiol-FE (LOESTRIN FE) 1-20 MG-MCG tablet Take 1 tablet by mouth daily.     nortriptyline (PAMELOR) 10 MG capsule TAKE 1 CAPSULE BY MOUTH AT BEDTIME 60 capsule 5   nortriptyline (PAMELOR) 50 MG capsule Take 1 capsule (50 mg total) by mouth at bedtime. Take 50 mg in addition to the 10 mg daily 30 capsule 5    ondansetron (ZOFRAN-ODT) 8 MG disintegrating tablet Take 1 tablet (8 mg total) by mouth every 8 (eight) hours as needed for nausea or vomiting. 20 tablet 0   pantoprazole (PROTONIX) 40 MG tablet TAKE 1 TABLET BY MOUTH EVERY DAY 60 tablet 0   Azelastine-Fluticasone (DYMISTA) 137-50 MCG/ACT SUSP Place 1 spray into the nose every 12 (twelve) hours. (Patient not taking: Reported on 07/13/2023) 23 g 2   metoprolol tartrate (LOPRESSOR) 25 MG tablet Take 0.5 tablets (12.5 mg total) by mouth 2 (two) times daily as needed (for palpitations). (Patient not taking: Reported on 06/18/2023) 30 tablet 4   Semaglutide-Weight Management (WEGOVY) 0.5 MG/0.5ML SOAJ Inject 0.5 mg into the skin once a week. (Patient not taking: Reported on 07/13/2023) 2 mL 1   No current facility-administered medications on file prior to visit.    BP 130/84 (BP Location: Left Arm, Patient Position: Sitting, Cuff Size: Large)   Pulse 96   Temp 98.1 F (36.7 C) (Oral)   Ht 5\' 7"  (1.702 m)   Wt 265 lb 3.2 oz (120.3 kg)   LMP 06/28/2023 (Exact Date)   SpO2 97%   BMI 41.54 kg/m       Objective:   Physical Exam Vitals and nursing note reviewed.  Constitutional:      Appearance: Normal appearance. She is obese.  HENT:     Right Ear: Tympanic membrane, ear canal and external ear normal.     Left Ear: Tympanic membrane, ear canal and external ear normal.     Nose: Nose normal. No congestion or rhinorrhea.  Eyes:     Extraocular Movements: Extraocular movements intact.     Pupils: Pupils are equal, round, and reactive to light.  Cardiovascular:     Rate and Rhythm: Regular rhythm.     Pulses: Normal pulses.     Heart sounds: Normal heart sounds.  Pulmonary:     Effort: Pulmonary effort is normal.     Breath sounds: Normal breath sounds.  Musculoskeletal:        General: No swelling, tenderness or deformity. Normal range of motion.  Skin:    General: Skin is warm and dry.  Neurological:     General: No focal deficit  present.     Mental Status: She is alert and oriented to person, place, and time.  Psychiatric:        Mood and Affect: Mood normal.        Behavior: Behavior normal.        Thought Content: Thought content normal.        Judgment: Judgment normal.  Assessment & Plan:  1. Pressure in head - This has resolved thankfully. Unknown cause, possibly from a degree of dehydration versus upper respiratory issue such as seasonal allergies.  No concern for CSF leak.  -Advised to stay hydrated and follow-up if symptoms return  2. Chronic midline low back pain with sciatica, sciatica laterality unspecified  - cyclobenzaprine (FLEXERIL) 10 MG tablet; Take 1 tablet (10 mg total) by mouth at bedtime.  Dispense: 15 tablet; Refill: 0 - methylPREDNISolone (MEDROL DOSEPAK) 4 MG TBPK tablet; Take as directed  Dispense: 21 tablet; Refill: 0   Shirline Frees, NP  Time spent with patient today was 31 minutes which consisted of chart review, discussing head pressure, and low back pain with sciatica work up, treatment answering questions and documentation.

## 2023-07-27 ENCOUNTER — Encounter: Payer: Self-pay | Admitting: Family Medicine

## 2023-07-31 NOTE — Progress Notes (Signed)
NEUROLOGY FOLLOW UP OFFICE NOTE  Whitney Aguirre [id number]  Assessment/Plan:   Tension-type headache, not intractable      1  Since she is having lethargy still with nortriptyline, taper off and then start duloxetine 30mg  daily.  We can increase to 60mg  daily in 6 weeks if needed. 2  Limit use of pain relievers to no more than 2 days out of week to prevent risk of rebound or medication-overuse headache. 3  Keep headache diary 4  Follow up 6-7 months.  Subjective:  Whitney Aguirre is a 32 year old right-handed female with Hashimoto's thyroiditis and PCOS and task specific dystonia who follows up for headache and task-specific dystonia.  UPDATE: Slight increase in headache frequency. 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   She had one different headache, a left sided pressure/stabbing headache while driving that lasted just 5 minutes.  No nausea, vomiting, photophobia, phonophobia or visual disturbance.  No neck pain at that time.  No recurrence.  Still feels nortriptyline makes her too drowsy in the morning.    Not sure if she stills clenches her jaw.  Doesn't have mouth guard.  Sometimes wears a retainer.  Current NSAIDS/analgesics:  Excedrin Migrane/Tension, Aleve Current triptans:  none Current ergotamine:  none Current anti-emetic:  Zofran 8mg  Current muscle relaxants:  Flexeril 10mg  at bedtime (sciatic/back pain) Current Antihypertensive medications:  metoprolol tartrate Current Antidepressant medications:  nortriptyline 60mg  at bedtime (higher doses caused lethargy) Current Anticonvulsant medications:  none Current anti-CGRP:  none Current Vitamins/Herbal/Supplements:  none Current Antihistamines/Decongestants:  none Other therapy:  none Hormone/birth control:  Mirena Other medications:   levothyroxine, metformin  Caffeine:  1 20 oz cup coffee daily.  Sometimes soda Alcohol:  occasional Smoker:  no Diet:  48 to 64 oz water daily.  Does not skip meals Exercise:  not routine Depression/Anxiety:  anxiety Other pain:  no Sleep hygiene:  poor.  no more than 5-6 hours a night.  Stress.  Cannot shut of brain.    HISTORY:  Onset:  headaches since she was 32 years old. Location:  usually forehead, top or back of head at base of neck Quality:  pressure vs throbbing vs pounding vs stabbling Intensity:  5-6/10.  She denies new headache, thunderclap headache or severe headache that wakes her from sleep. Aura:  absent Prodrome:  absent Associated symptoms:  No photophobia, phonophobia.  Rarely nausea.  She denies associated vomiting, visual disturbance, unilateral numbness or weakness. Duration:  Usually 30 to 60 minutes but may last up to 5-6 hours Frequency:  daily until starting nortriptyline.  Started nortriptyline on 11/03/2021.  Now approximately 1 to 2 days a week.   Frequency of abortive medication: 1 to 2 days a week. Triggers:  stress Relieving factors:  Excedrin Activity:  able to function   She started noticing a tremor in her hands a few months ago.  Happens at work when she is using her hands, such as at the computer or writing.  She feels the tremor start in hand and she needs to stop and rest until it passes.  Thumb starts to twitch and intrinsic hand muscles spasm.  Hand is in a flexed position.  Happens in both hands but mostly the left hand.  Lasts 1 to 4 minutes.  Needs to stop and  let it pass.  No pain, weakness or numbness.  Occurs 1 to 2 times a week.  Denies family history of tremor.  Thyroid has been controlled.        Past NSAIDS/analgesics:  ibuprofen, naproxen, acetaminophen Past abortive triptans:  none Past abortive ergotamine:  none Past muscle relaxants:  none Past anti-emetic:  none Past antihypertensive medications:  none Past antidepressant  medications:  amitriptyline Past anticonvulsant medications:  topiramate Past anti-CGRP:  none Past vitamins/Herbal/Supplements:  none Past antihistamines/decongestants:  Flonase, Sudafed Other past therapies:  none    Family history of headache:  mom (severe migraines)  PAST MEDICAL HISTORY: Past Medical History:  Diagnosis Date   ACL tear 05/12/2014   injured on trampoline   Hashimoto's disease 2018   Headache(784.0)    stress   Polycystic ovarian syndrome    no current med.   Thyroid disease     MEDICATIONS: Current Outpatient Medications on File Prior to Visit  Medication Sig Dispense Refill   Azelastine-Fluticasone (DYMISTA) 137-50 MCG/ACT SUSP Place 1 spray into the nose every 12 (twelve) hours. (Patient not taking: Reported on 07/13/2023) 23 g 2   cyclobenzaprine (FLEXERIL) 10 MG tablet Take 1 tablet (10 mg total) by mouth at bedtime. 15 tablet 0   levothyroxine (SYNTHROID) 150 MCG tablet Take 150 mcg by mouth daily in the afternoon. 2 tablets Monday- Saturday and 1 tablet on Sunday     metFORMIN (GLUCOPHAGE-XR) 500 MG 24 hr tablet Take 1 tablet (500 mg total) by mouth daily with breakfast. (Patient taking differently: Take 1,000 mg by mouth daily with breakfast.) 30 tablet 0   methylPREDNISolone (MEDROL DOSEPAK) 4 MG TBPK tablet Take as directed 21 tablet 0   metoprolol tartrate (LOPRESSOR) 25 MG tablet Take 0.5 tablets (12.5 mg total) by mouth 2 (two) times daily as needed (for palpitations). (Patient not taking: Reported on 06/18/2023) 30 tablet 4   norethindrone-ethinyl estradiol-FE (LOESTRIN FE) 1-20 MG-MCG tablet Take 1 tablet by mouth daily.     nortriptyline (PAMELOR) 10 MG capsule TAKE 1 CAPSULE BY MOUTH AT BEDTIME 60 capsule 5   nortriptyline (PAMELOR) 50 MG capsule Take 1 capsule (50 mg total) by mouth at bedtime. Take 50 mg in addition to the 10 mg daily 30 capsule 5   ondansetron (ZOFRAN-ODT) 8 MG disintegrating tablet Take 1 tablet (8 mg total) by mouth every 8  (eight) hours as needed for nausea or vomiting. 20 tablet 0   pantoprazole (PROTONIX) 40 MG tablet TAKE 1 TABLET BY MOUTH EVERY DAY 60 tablet 0   Semaglutide-Weight Management (WEGOVY) 0.5 MG/0.5ML SOAJ Inject 0.5 mg into the skin once a week. (Patient not taking: Reported on 07/13/2023) 2 mL 1   No current facility-administered medications on file prior to visit.    ALLERGIES: No Known Allergies  FAMILY HISTORY: Family History  Problem Relation Age of Onset   Cardiomyopathy Mother    High blood pressure Mother    Emphysema Mother    Ulcers Father    Cancer Paternal Grandfather    Diabetes Other    Hypertension Other    Liver disease Neg Hx    Colon cancer Neg Hx    Esophageal cancer Neg Hx       Objective:  Blood pressure 118/70, pulse 98, height 5\' 7"  (1.702 m), weight 270 lb (122.5 kg), last menstrual period 06/28/2023, SpO2 98%. General: No acute distress.  Patient appears well-groomed.   Head:  Normocephalic/atraumatic Eyes:  Fundi examined but not visualized Neck: supple,  no paraspinal tenderness, full range of motion Heart:  Regular rate and rhythm Neurological Exam: alert and oriented.  Speech fluent and not dysarthric, language intact.  CN II-XII intact. Bulk and tone normal, muscle strength 5/5 throughout.  Sensation to light touch intact.  Deep tendon reflexes 2+ throughout.  Finger to nose testing intact.  Gait normal, Romberg negative.   Shon Millet, DO  CC: Betty [place], MD

## 2023-08-01 ENCOUNTER — Encounter: Payer: Self-pay | Admitting: Neurology

## 2023-08-01 ENCOUNTER — Ambulatory Visit: Payer: BC Managed Care – PPO | Admitting: Neurology

## 2023-08-01 VITALS — BP 118/70 | HR 98 | Ht 67.0 in | Wt 270.0 lb

## 2023-08-01 DIAGNOSIS — G44219 Episodic tension-type headache, not intractable: Secondary | ICD-10-CM | POA: Diagnosis not present

## 2023-08-01 DIAGNOSIS — G249 Dystonia, unspecified: Secondary | ICD-10-CM | POA: Diagnosis not present

## 2023-08-01 MED ORDER — NORTRIPTYLINE HCL 25 MG PO CAPS: 25.0000 mg | ORAL_CAPSULE | Freq: Every day | ORAL | 0 refills | Status: DC

## 2023-08-01 MED ORDER — DULOXETINE HCL 30 MG PO CPEP: 30.0000 mg | ORAL_CAPSULE | Freq: Every day | ORAL | 5 refills | Status: DC

## 2023-08-01 NOTE — Patient Instructions (Signed)
Taper off nortriptyline: Take nortriptyline 50mg  pill at bedtime for one week Then nortriptyline 25mg  pill at bedtime for one week Then nortriptyline 10mg  pill at bedtime for one week Once you are off nortriptyline, start duloxetine 30mg  daily.  If no improvement in 6 weeks, contact me and we can increase dose. Limit use of pain relievers to no more than 2 days out of week to prevent risk of rebound or medication-overuse headache. Follow up 6-7 weeks.

## 2023-08-08 DIAGNOSIS — E039 Hypothyroidism, unspecified: Secondary | ICD-10-CM | POA: Diagnosis not present

## 2023-08-08 DIAGNOSIS — E282 Polycystic ovarian syndrome: Secondary | ICD-10-CM | POA: Diagnosis not present

## 2023-08-08 DIAGNOSIS — R7301 Impaired fasting glucose: Secondary | ICD-10-CM | POA: Diagnosis not present

## 2023-08-10 ENCOUNTER — Other Ambulatory Visit: Payer: Self-pay | Admitting: Neurology

## 2023-08-15 DIAGNOSIS — E669 Obesity, unspecified: Secondary | ICD-10-CM | POA: Diagnosis not present

## 2023-08-15 DIAGNOSIS — E282 Polycystic ovarian syndrome: Secondary | ICD-10-CM | POA: Diagnosis not present

## 2023-08-15 DIAGNOSIS — Z23 Encounter for immunization: Secondary | ICD-10-CM | POA: Diagnosis not present

## 2023-08-15 DIAGNOSIS — R7301 Impaired fasting glucose: Secondary | ICD-10-CM | POA: Diagnosis not present

## 2023-08-15 DIAGNOSIS — E039 Hypothyroidism, unspecified: Secondary | ICD-10-CM | POA: Diagnosis not present

## 2023-08-20 ENCOUNTER — Ambulatory Visit (INDEPENDENT_AMBULATORY_CARE_PROVIDER_SITE_OTHER): Payer: BC Managed Care – PPO | Admitting: Family Medicine

## 2023-08-20 ENCOUNTER — Encounter: Payer: Self-pay | Admitting: Family Medicine

## 2023-08-20 VITALS — BP 130/82 | HR 98 | Temp 97.6°F | Ht 67.0 in | Wt 255.0 lb

## 2023-08-20 DIAGNOSIS — G43909 Migraine, unspecified, not intractable, without status migrainosus: Secondary | ICD-10-CM | POA: Insufficient documentation

## 2023-08-20 DIAGNOSIS — E282 Polycystic ovarian syndrome: Secondary | ICD-10-CM | POA: Insufficient documentation

## 2023-08-20 DIAGNOSIS — Z0289 Encounter for other administrative examinations: Secondary | ICD-10-CM

## 2023-08-20 DIAGNOSIS — Z6839 Body mass index (BMI) 39.0-39.9, adult: Secondary | ICD-10-CM

## 2023-08-20 DIAGNOSIS — E669 Obesity, unspecified: Secondary | ICD-10-CM | POA: Diagnosis not present

## 2023-08-20 NOTE — Assessment & Plan Note (Signed)
Reports a history of PCOS with associated weight gain.  Never on metformin. Has irregular menses.   Plan to check fasting insulin with upcoming labs.  Consider use of metformin along with dietary change

## 2023-08-20 NOTE — Assessment & Plan Note (Signed)
She was recently changed from nortriptyline to duloxetine for migraine prevention.  She is seeing a reduction in headache frequency.  She has been working on improving sleep at night.  We discussed how TCAs may contribute to further weight gain.  Look for improvements in migraine frequency with healthy eating patterns, adequate sleep at night, proper hydration and stress reduction.

## 2023-08-20 NOTE — Progress Notes (Signed)
Office: [phone]  /  Fax: [phone]   Initial Visit  Whitney Aguirre was seen in clinic today to evaluate for obesity. She is interested in losing weight to improve overall health and reduce the risk of weight related complications. She presents today to review program treatment options, initial physical assessment, and evaluation.     She was referred by: Friend or Family  When asked what else they would like to accomplish? 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  She was overweight in childhood.  Husband and mom at home  When asked how has your weight affected you? She states: Contributed to orthopedic problems or mobility issues, Having fatigue, and Has affected mood   Some associated conditions: PCOS and Other: hypothyroidism  Contributing factors: Family history, Stress, and Reduced physical activity  Weight promoting medications identified: Steroids  Current nutrition plan: None  Current level of physical activity: NEAT  thinking about [facility] into [facility] [employer] and going to a nearby park  Current or previous pharmacotherapy: GLP-1  Saxenda-- no weight loss; UVOZDG-- liked but stopped due to lack over coverage  Response to medication: Lost weight and was able to maintain weight loss   Past medical history includes:   Past Medical History:  Diagnosis Date   ACL tear 05/12/2014   injured on trampoline   Hashimoto's disease 2018   Headache(784.0)    stress   Polycystic ovarian syndrome    no current med.   Thyroid disease      Objective:   BP 130/82   Pulse 98   Temp 97.6 F (36.4 C)   Ht 5\' 7"  (1.702 m)   Wt 255 lb (115.7 kg)   SpO2 100%   BMI 39.94 kg/m  She was weighed on the bioimpedance scale: Body mass index is 39.94 kg/m.  Peak Weight:275 , Body Fat%:43, Visceral Fat Rating:10, Weight trend over the  last 12 months: Decreasing  General:  Alert, oriented and cooperative. Patient is in no acute distress.  Respiratory: Normal respiratory effort, no problems with respiration noted   Gait: able to ambulate independently  Mental Status: Normal mood and affect. Normal behavior. Normal judgment and thought content.   DIAGNOSTIC DATA REVIEWED:  BMET    Component Value Date/Time   NA 142 07/02/2020 1114   K 4.3 07/02/2020 1114   CL 107 07/02/2020 1114   CO2 26 07/02/2020 1114   GLUCOSE 89 07/02/2020 1114   BUN 14 07/02/2020 1114   CREATININE 0.89 07/02/2020 1114   CALCIUM 9.7 07/02/2020 1114   GFRNONAA >90 12/07/2014 1729   GFRAA >90 12/07/2014 1729   No results found for: "HGBA1C" No results found for: "INSULIN" CBC    Component Value Date/Time   WBC 7.8 07/02/2020 1114   RBC 4.88 07/02/2020 1114   HGB 14.1 07/02/2020 1114   HCT 43.9 07/02/2020 1114   PLT 234 07/02/2020 1114   MCV 90.0 07/02/2020 1114   MCH 28.9 07/02/2020 1114   MCHC 32.1 07/02/2020 1114   RDW 13.3 07/02/2020 1114   Iron/TIBC/Ferritin/ %Sat No results found for: "IRON", "TIBC", "FERRITIN", "IRONPCTSAT" Lipid Panel  No results found for: "CHOL", "TRIG", "HDL", "CHOLHDL", "VLDL", "LDLCALC", "LDLDIRECT" Hepatic Function Panel     Component Value Date/Time   PROT 7.1 07/02/2020 1114   ALBUMIN 4.2 10/04/2016 1023   AST 17 07/02/2020 1114   ALT 25  07/02/2020 1114   ALKPHOS 49 10/04/2016 1023   BILITOT 0.2 07/02/2020 1114      Component Value Date/Time   TSH 132.17 (H) 10/20/2020 0747     Assessment and Plan:   PCOS (polycystic ovarian syndrome) Assessment & Plan: Reports a history of PCOS with associated weight gain.  Never on metformin. Has irregular menses.   Plan to check fasting insulin with upcoming labs.  Consider use of metformin along with dietary change   Obesity, Class II, BMI 35-39.9  BMI 39.0-39.9,adult  Migraine without status migrainosus, not intractable, unspecified  migraine type Assessment & Plan: She was recently changed from nortriptyline to duloxetine for migraine prevention.  She is seeing a reduction in headache frequency.  She has been working on improving sleep at night.  We discussed how TCAs may contribute to further weight gain.  Look for improvements in migraine frequency with healthy eating patterns, adequate sleep at night, proper hydration and stress reduction.         Obesity Treatment / Action Plan:  Patient will work on garnering support from family and friends to begin weight loss journey. Will work on eliminating or reducing the presence of highly palatable, calorie dense foods in the home. Will complete provided nutritional and psychosocial assessment questionnaire before the next appointment. Will be scheduled for indirect calorimetry to determine resting energy expenditure in a fasting state.  This will allow [place] to create a reduced calorie, high-protein meal plan to promote loss of fat mass while preserving muscle mass. Will think about ideas on how to incorporate physical activity into their daily routine. Counseled on the health benefits of losing 5%-15% of total body weight. Was counseled on nutritional approaches to weight loss and benefits of reducing processed foods and consuming plant-based foods and high quality protein as part of nutritional weight management. Was counseled on pharmacotherapy and role as an adjunct in weight management.   Obesity Education Performed Today:  She was weighed on the bioimpedance scale and results were discussed and documented in the synopsis.  We discussed obesity as a disease and the importance of a more detailed evaluation of all the factors contributing to the disease.  We discussed the importance of long term lifestyle changes which include nutrition, exercise and behavioral modifications as well as the importance of customizing this to her specific health and social needs.  We  discussed the benefits of reaching a healthier weight to alleviate the symptoms of existing conditions and reduce the risks of the biomechanical, metabolic and psychological effects of obesity.  Whitney Aguirre appears to be in the action stage of change and states they are ready to start intensive lifestyle modifications and behavioral modifications.  30 minutes was spent today on this visit including the above counseling, pre-visit chart review, and post-visit documentation.  Reviewed by clinician on day of visit: allergies, medications, problem list, medical history, surgical history, family history, social history, and previous encounter notes pertinent to obesity diagnosis.    Seymour Bars, D.O. DABFM, DABOM [facility] [phone] [place], [place] [address] [phone]

## 2023-08-29 ENCOUNTER — Other Ambulatory Visit: Payer: Self-pay | Admitting: Family Medicine

## 2023-08-29 DIAGNOSIS — R1013 Epigastric pain: Secondary | ICD-10-CM

## 2023-09-04 ENCOUNTER — Encounter: Payer: Self-pay | Admitting: Family Medicine

## 2023-09-04 ENCOUNTER — Ambulatory Visit: Payer: BC Managed Care – PPO | Admitting: Family Medicine

## 2023-09-04 VITALS — BP 125/80 | HR 97 | Temp 97.6°F | Ht 67.0 in | Wt 256.0 lb

## 2023-09-04 DIAGNOSIS — Z1331 Encounter for screening for depression: Secondary | ICD-10-CM | POA: Insufficient documentation

## 2023-09-04 DIAGNOSIS — F32A Depression, unspecified: Secondary | ICD-10-CM

## 2023-09-04 DIAGNOSIS — R5383 Other fatigue: Secondary | ICD-10-CM | POA: Insufficient documentation

## 2023-09-04 DIAGNOSIS — R0602 Shortness of breath: Secondary | ICD-10-CM | POA: Diagnosis not present

## 2023-09-04 DIAGNOSIS — E038 Other specified hypothyroidism: Secondary | ICD-10-CM

## 2023-09-04 DIAGNOSIS — F419 Anxiety disorder, unspecified: Secondary | ICD-10-CM | POA: Diagnosis not present

## 2023-09-04 DIAGNOSIS — Z0000 Encounter for general adult medical examination without abnormal findings: Secondary | ICD-10-CM | POA: Insufficient documentation

## 2023-09-04 DIAGNOSIS — R632 Polyphagia: Secondary | ICD-10-CM | POA: Insufficient documentation

## 2023-09-04 DIAGNOSIS — E282 Polycystic ovarian syndrome: Secondary | ICD-10-CM

## 2023-09-04 DIAGNOSIS — Z6841 Body Mass Index (BMI) 40.0 and over, adult: Secondary | ICD-10-CM

## 2023-09-04 MED ORDER — QSYMIA 3.75-23 MG PO CP24: ORAL_CAPSULE | ORAL | 0 refills | Status: DC

## 2023-09-04 MED ORDER — QSYMIA 7.5-46 MG PO CP24: ORAL_CAPSULE | ORAL | 0 refills | Status: DC

## 2023-09-04 NOTE — Patient Instructions (Signed)
Begin OTC vitamin D3 4,000 international units  daily  Begin Qsymia 3.75/23 mg every morning (with or without food) x 14 days then increase Qsymia to 7.5/46 mg each morning  Call if any problems or questions  Check out [email or web address].com for further information  Aim for 90 g of dietary protein per day  Aim for 4,000 steps per day

## 2023-09-05 ENCOUNTER — Encounter: Payer: Self-pay | Admitting: *Deleted

## 2023-09-05 LAB — COMPREHENSIVE METABOLIC PANEL
ALT: 30 [IU]/L (ref 0–32)
AST: 19 [IU]/L (ref 0–40)
Albumin: 4 g/dL (ref 4.0–5.0)
Alkaline Phosphatase: 62 [IU]/L (ref 44–121)
BUN/Creatinine Ratio: 13 (ref 9–23)
BUN: 8 mg/dL (ref 6–20)
Bilirubin Total: 0.4 mg/dL (ref 0.0–1.2)
CO2: 23 mmol/L (ref 20–29)
Calcium: 9.6 mg/dL (ref 8.7–10.2)
Chloride: 104 mmol/L (ref 96–106)
Creatinine, Ser: 0.64 mg/dL (ref 0.57–1.00)
Globulin, Total: 2.4 g/dL (ref 1.5–4.5)
Glucose: 97 mg/dL (ref 70–99)
Potassium: 4.4 mmol/L (ref 3.5–5.2)
Sodium: 142 mmol/L (ref 134–144)
Total Protein: 6.4 g/dL (ref 6.0–8.5)
eGFR: 123 mL/min/{1.73_m2} (ref >59)

## 2023-09-05 LAB — CBC WITH DIFFERENTIAL/PLATELET
Basophils Absolute: 0 10*3/uL (ref 0.0–0.2)
Basos: 1 %
EOS (ABSOLUTE): 0.2 10*3/uL (ref 0.0–0.4)
Eos: 3 %
Hematocrit: 43 % (ref 34.0–46.6)
Hemoglobin: 14.1 g/dL (ref 11.1–15.9)
Immature Grans (Abs): 0 10*3/uL (ref 0.0–0.1)
Immature Granulocytes: 0 %
Lymphocytes Absolute: 2.6 10*3/uL (ref 0.7–3.1)
Lymphs: 43 %
MCH: 29.3 pg (ref 26.6–33.0)
MCHC: 32.8 g/dL (ref 31.5–35.7)
MCV: 89 fL (ref 79–97)
Monocytes Absolute: 0.5 10*3/uL (ref 0.1–0.9)
Monocytes: 8 %
Neutrophils Absolute: 2.7 10*3/uL (ref 1.4–7.0)
Neutrophils: 45 %
Platelets: 338 10*3/uL (ref 150–450)
RBC: 4.81 x10E6/uL (ref 3.77–5.28)
RDW: 12 % (ref 11.7–15.4)
WBC: 6 10*3/uL (ref 3.4–10.8)

## 2023-09-05 LAB — VITAMIN B12: Vitamin B-12: 345 pg/mL (ref 232–1245)

## 2023-09-05 LAB — LIPID PANEL
Chol/HDL Ratio: 4.2 {ratio} (ref 0.0–4.4)
Cholesterol, Total: 142 mg/dL (ref 100–199)
HDL: 34 mg/dL — ABNORMAL LOW (ref >39)
LDL Chol Calc (NIH): 68 mg/dL (ref 0–99)
Triglycerides: 246 mg/dL — ABNORMAL HIGH (ref 0–149)
VLDL Cholesterol Cal: 40 mg/dL (ref 5–40)

## 2023-09-05 LAB — TSH: TSH: 0.059 u[IU]/mL — ABNORMAL LOW (ref 0.450–4.500)

## 2023-09-05 LAB — FOLATE: Folate: 5.7 ng/mL (ref >3.0)

## 2023-09-05 LAB — INSULIN, RANDOM: INSULIN: 53.8 u[IU]/mL — ABNORMAL HIGH (ref 2.6–24.9)

## 2023-09-05 LAB — T3: T3, Total: 190 ng/dL — ABNORMAL HIGH (ref 71–180)

## 2023-09-05 LAB — VITAMIN D 25 HYDROXY (VIT D DEFICIENCY, FRACTURES): Vit D, 25-Hydroxy: 19.4 ng/mL — ABNORMAL LOW (ref 30.0–100.0)

## 2023-09-05 LAB — HEMOGLOBIN A1C
Est. average glucose Bld gHb Est-mCnc: 105 mg/dL
Hgb A1c MFr Bld: 5.3 % (ref 4.8–5.6)

## 2023-09-05 LAB — T4, FREE: Free T4: 2.63 ng/dL — ABNORMAL HIGH (ref 0.82–1.77)

## 2023-09-05 NOTE — Progress Notes (Unsigned)
Chief Complaint:   OBESITY Whitney Aguirre (MR# [id number]) is a 32 y.o. female who presents for evaluation and treatment of obesity and related comorbidities. Current BMI is Body mass index is 40.1 kg/m. Whitney Aguirre has been struggling with her weight for many years and has been unsuccessful in either losing weight, maintaining weight loss, or reaching her healthy weight goal.  Whitney Aguirre is currently in the action stage of change and ready to dedicate time achieving and maintaining a healthier weight. Whitney Aguirre is interested in becoming our patient and working on intensive lifestyle modifications including (but not limited to) diet and exercise for weight loss.  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  She averages 2000 steps per day.  She is married and her mom and cousin lives with her.  She has used Wegovy in the past.  Whitney Aguirre's habits were reviewed today and are as follows: Her family eats meals together, she thinks her family will eat healthier with her, her desired weight loss is 106 lbs, she has been heavy most of her life, she started gaining excessive weight in middle school (2006), her heaviest weight ever was 275-280 pounds, she is a picky eater and doesn't like to eat healthier foods, she has significant food cravings issues, she skips meals frequently, she is frequently drinking liquids with calories, she frequently makes poor food choices, and she struggles with emotional eating.  Depression Screen [place]'s Food and Mood (modified PHQ-9) score was 12.  Subjective:   1. Other fatigue Whitney Aguirre admits to daytime somnolence and admits to waking up still tired. Patient has a history of symptoms of daytime fatigue, morning fatigue, and morning headache. Whitney Aguirre generally gets 6.5 hours of sleep per night, and states that she has nightime awakenings. Snoring is not present. Apneic episodes are not present. Epworth Sleepiness Score is 3.  EKG was done in July 2024.  2. SOBOE  (shortness of breath on exertion) Whitney Aguirre notes increasing shortness of breath with exercising and seems to be worsening over time with weight gain. She notes getting out of breath sooner with activity than she used to. This has not gotten worse recently. Whitney Aguirre denies shortness of breath at rest or orthopnea.  3. PCOS (polycystic ovarian syndrome) Patient is on metformin 500 mg twice daily.  She is on BCPS and has occasional diarrhea.  4. Other specified hypothyroidism Patient is on levothyroxine 300 mcg daily.  Her energy level is stable.  5. Polyphagia Patient had short-term success on Wegovy.  6. Anxiety and depression Patient is on Cymbalta 30 mg once daily.  She reports a good support system at home.  Assessment/Plan:   1. Other fatigue [name] does feel that her weight is causing her energy to be lower than it should be. Fatigue may be related to obesity, depression or many other causes. Labs will be ordered, and in the meanwhile, Whitney Aguirre will focus on self care including making healthy food choices, increasing physical activity and focusing on stress reduction.  - VITAMIN D 25 Hydroxy (Vit-D Deficiency, Fractures) - TSH - T4, free - T3 - Lipid panel - Insulin, random - Hemoglobin A1c - Folate - Comprehensive metabolic panel - Vitamin B12 - CBC with Differential/Platelet  2. SOBOE (shortness of breath on exertion) Whitney Aguirre does feel that she gets out of breath more easily that she used to when she exercises. Whitney Aguirre shortness of breath appears to be obesity related and exercise induced. She has agreed to work on weight loss and  gradually increase exercise to treat her exercise induced shortness of breath. Will continue to monitor closely.  3. PCOS (polycystic ovarian syndrome) We will check fasting insulin today, and we will follow-up at patient's next office visit.  4. Other specified hypothyroidism We will check thyroid labs today, and we will follow-up at patient's next  office visit.  5. Polyphagia Patient agreed to begin Qsymia 3.75-23 mg every morning and Qsymia 7.5-46 mg every morning with no refills.  PDMP was reviewed.  Patient is on oral contraceptive and uses backup birth control.  She is to increase her protein intake to 90+ grams per day.  - Phentermine-Topiramate (QSYMIA) 3.75-23 MG CP24; 1 capsule po qAM  Dispense: 14 capsule; Refill: 0 - Phentermine-Topiramate (QSYMIA) 7.5-46 MG CP24; 1 capsule po QAM  Dispense: 30 capsule; Refill: 0  6. Anxiety and depression Patient is to consider adding cognitive behavioral therapy with Dr. Dewaine Conger, our bariatric psychologist.  7. Depression screen Whitney Aguirre had a positive depression screening. Depression is commonly associated with obesity and often results in emotional eating behaviors. We will monitor this closely and work on CBT to help improve the non-hunger eating patterns. Referral to Psychology may be required if no improvement is seen as she continues in our clinic.  8. BMI 40.0-44.9, adult (HCC)  9. Morbid obesity with starting BMI 40.2 Whitney Aguirre is currently in the action stage of change and her goal is to continue with weight loss efforts. I recommend Whitney Aguirre begin the structured treatment plan as follows:  She has agreed to the Category 3 Plan.  100-calorie snack list was given.  Protein content of food list was given.  Exercise goals: All adults should avoid inactivity. Some physical activity is better than none, and adults who participate in any amount of physical activity gain some health benefits.   Behavioral modification strategies: increasing lean protein intake, increasing water intake, decreasing eating out, meal planning and cooking strategies, keeping healthy foods in the home, better snacking choices, and planning for success.  She was informed of the importance of frequent follow-up visits to maximize her success with intensive lifestyle modifications for her multiple health conditions.  She was informed we would discuss her lab results at her next visit unless there is a critical issue that needs to be addressed sooner. Whitney Aguirre agreed to keep her next visit at the agreed upon time to discuss these results.  Objective:   Blood pressure 125/80, pulse 97, temperature 97.6 F (36.4 C), height 5\' 7"  (1.702 m), weight 256 lb (116.1 kg), SpO2 100%. Body mass index is 40.1 kg/m.  EKG: Normal sinus rhythm, rate 54 BPM.  Indirect Calorimeter completed today shows a VO2 of 313 and a REE of 2160.  Her calculated basal metabolic rate is 9147 thus her basal metabolic rate is better than expected.  General: Cooperative, alert, well developed, in no acute distress. HEENT: Conjunctivae and lids unremarkable. Cardiovascular: Regular rhythm.  Lungs: Normal work of breathing. Neurologic: No focal deficits.   Lab Results  Component Value Date   CREATININE 0.64 09/04/2023   BUN 8 09/04/2023   NA 142 09/04/2023   K 4.4 09/04/2023   CL 104 09/04/2023   CO2 23 09/04/2023   Lab Results  Component Value Date   ALT 30 09/04/2023   AST 19 09/04/2023   ALKPHOS 62 09/04/2023   BILITOT 0.4 09/04/2023   Lab Results  Component Value Date   HGBA1C 5.3 09/04/2023   Lab Results  Component Value Date   INSULIN 53.8 (  H) 09/04/2023   Lab Results  Component Value Date   TSH 0.059 (L) 09/04/2023   Lab Results  Component Value Date   CHOL 142 09/04/2023   HDL 34 (L) 09/04/2023   LDLCALC 68 09/04/2023   TRIG 246 (H) 09/04/2023   CHOLHDL 4.2 09/04/2023   Lab Results  Component Value Date   WBC 6.0 09/04/2023   HGB 14.1 09/04/2023   HCT 43.0 09/04/2023   MCV 89 09/04/2023   PLT 338 09/04/2023   No results found for: "IRON", "TIBC", "FERRITIN"  Attestation Statements:   Reviewed by clinician on day of visit: allergies, medications, problem list, medical history, surgical history, family history, social history, and previous encounter notes.  Time spent on visit including  pre-visit chart review and post-visit charting and care was 40 minutes.   Trude Mcburney, am acting as transcriptionist for Seymour Bars, DO.  I have reviewed the above documentation for accuracy and completeness, and I agree with the above. Seymour Bars, DO

## 2023-09-11 NOTE — Telephone Encounter (Signed)
Labs have been faxed to Dr. Talmage Nap per patient request regarding her thyroid levels. Patient has been notified and has let Dr. Talmage Nap know.

## 2023-09-12 ENCOUNTER — Telehealth: Payer: Self-pay | Admitting: *Deleted

## 2023-09-12 NOTE — Telephone Encounter (Signed)
Prior authorization done via cover my meds for patients Qsymia. Waiting on determination.

## 2023-09-13 NOTE — Telephone Encounter (Signed)
Qsymia denied  We reviewed the information you provided in support of a request to obtain Qsymia 3.75-23  mg CPMP 24HR under your patient's plan. We are unable to approve this request for the  following reason(s): ? Coverage is provided for patients after a trial of behavioral modification and dietary  restriction for at least 3 months. Coverage cannot be authorized at this time.  We based this decision on the prior authorization criteria for: [address] [employer] [place]  Policy

## 2023-09-17 ENCOUNTER — Ambulatory Visit: Payer: BC Managed Care – PPO | Admitting: Family Medicine

## 2023-09-27 ENCOUNTER — Encounter: Payer: Self-pay | Admitting: Family Medicine

## 2023-09-27 ENCOUNTER — Ambulatory Visit: Payer: BC Managed Care – PPO | Admitting: Family Medicine

## 2023-09-27 VITALS — BP 125/83 | HR 66 | Temp 97.9°F | Ht 67.0 in | Wt 257.0 lb

## 2023-09-27 DIAGNOSIS — E88819 Insulin resistance, unspecified: Secondary | ICD-10-CM | POA: Diagnosis not present

## 2023-09-27 DIAGNOSIS — E038 Other specified hypothyroidism: Secondary | ICD-10-CM

## 2023-09-27 DIAGNOSIS — Z6841 Body Mass Index (BMI) 40.0 and over, adult: Secondary | ICD-10-CM

## 2023-09-27 DIAGNOSIS — E662 Morbid (severe) obesity with alveolar hypoventilation: Secondary | ICD-10-CM

## 2023-09-27 DIAGNOSIS — E559 Vitamin D deficiency, unspecified: Secondary | ICD-10-CM | POA: Diagnosis not present

## 2023-09-27 DIAGNOSIS — E8881 Metabolic syndrome: Secondary | ICD-10-CM

## 2023-09-27 DIAGNOSIS — E66813 Obesity, class 3: Secondary | ICD-10-CM

## 2023-09-27 MED ORDER — VITAMIN D (ERGOCALCIFEROL) 1.25 MG (50000 UNIT) PO CAPS
50000.0000 [IU] | ORAL_CAPSULE | ORAL | 0 refills | Status: DC
Start: 2023-09-27 — End: 2024-01-16

## 2023-09-27 NOTE — Assessment & Plan Note (Signed)
Fasting insulin elevated at 53.8. Taking metformin XR 1000 mg daily per PCP for PCOS with IR Has been working on reducing intake of added sugar and starches Has plans to increase walking time  Continue to work on a low sugar diet with weight reduction and an increase in exercise

## 2023-09-27 NOTE — Assessment & Plan Note (Signed)
Last vitamin D Lab Results  Component Value Date   VD25OH 19.4 (L) 09/04/2023   Currently not on a vitamin D supplement  C/o fatigue Vitamin D is important for energy level, immune function and bone health  Start on RX vitamin D 50,000 international units  weekly

## 2023-09-27 NOTE — Assessment & Plan Note (Signed)
Lab Results  Component Value Date   TSH 0.059 (L) 09/04/2023   She has had her levothyroxine dose reduced by Dr Talmage Nap since her last visit She has c/o feeling hot but denies diarrhea or heart palpitations  She plans to have her thyroid labs rechecked by Dr Talmage Nap in November

## 2023-09-27 NOTE — Patient Instructions (Addendum)
You are welcome to log daily intake of food and drink using MyNet Diary  Aim for 1500- 1700 cal/ day This should include 100+ g of protein daily  Work on stress reduction  Aim for 30 min of walking 3 + x a week  [place] job with muscle gain and body fat loss!

## 2023-09-27 NOTE — Addendum Note (Signed)
Addended by: Glennis Brink on: 09/27/2023 08:52 AM   Modules accepted: Orders

## 2023-09-27 NOTE — Progress Notes (Signed)
Office: [phone]  /  Fax: [phone]  WEIGHT SUMMARY AND BIOMETRICS  Starting Date: 09/04/23  Starting Weight: 256lb   Weight Lost Since Last Visit: 0lb   Vitals Temp: 97.9 F (36.6 C) BP: 125/83 Pulse Rate: 66 SpO2: 98 %   Body Composition  Body Fat %: 44.4 % Fat Mass (lbs): 114.2 lbs Muscle Mass (lbs): 136 lbs Total Body Water (lbs): 95.6 lbs Visceral Fat Rating : 11   HPI  Chief Complaint: OBESITY  Whitney Aguirre is here to discuss her progress with her obesity treatment plan. She is on the the Category 3 Plan and states she is following her eating plan approximately 70-80 % of the time. She states she is exercising 0 minutes 0 times per week.   Interval History:  Since last office visit she is up 1 lb She is up 3.2 lb of muscle mass and is down 2.4 lb of body fat in 4 weeks She has a net weight gain of 1 lb She has been on plan about half the time She has had less time for workouts, caring for her mother in law who has metastatic breast cancer She is trying to make healthier food choices when eating out and has struggled with stress eating Her family is supportive though her mom brings in junk food She working on sleeping better Qsymia will be approved after 12/23 Thyroid medicine was adjusted by Dr Talmage Nap.  She has felt more hot.  11/26 next lab  Dining out guide  Pharmacotherapy: none  PHYSICAL EXAM:  Blood pressure 125/83, pulse 66, temperature 97.9 F (36.6 C), height 5\' 7"  (1.702 m), weight 257 lb (116.6 kg), SpO2 98%. Body mass index is 40.25 kg/m.  General: She is overweight, cooperative, alert, well developed, and in no acute distress. PSYCH: Has normal mood, affect and thought process.   Lungs: Normal breathing effort, no conversational dyspnea.   ASSESSMENT AND PLAN  TREATMENT PLAN FOR OBESITY:  Recommended Dietary Goals  Whitney Aguirre is currently in the action stage of change. As such, her goal is to continue weight management plan. She  has agreed to the Category 3 Plan and keeping a food journal and adhering to recommended goals of 1500-1700  calories and 100+ g of protein. - dining out guide given  Behavioral Intervention  We discussed the following Behavioral Modification Strategies today: increasing lean protein intake to established goals, avoiding skipping meals, increasing water intake , work on tracking and journaling calories using tracking application, work on managing stress, creating time for self-care and relaxation, continue to work on implementation of reduced calorie nutritional plan, planning for success, better snacking choices, and continue to work on maintaining a reduced calorie state, getting the recommended amount of protein, incorporating whole foods, making healthy choices, staying well hydrated and practicing mindfulness when eating..  Additional resources provided today: NA  Recommended Physical Activity Goals  Whitney Aguirre has been advised to work up to 150 minutes of moderate intensity aerobic activity a week and strengthening exercises 2-3 times per week for cardiovascular health, weight loss maintenance and preservation of muscle mass.   She has agreed to Increase the intensity, frequency or duration of aerobic exercises    Pharmacotherapy changes for the treatment of obesity: none  ASSOCIATED CONDITIONS ADDRESSED TODAY  Insulin resistance Assessment & Plan: Fasting insulin elevated at 53.8. Taking metformin XR 1000 mg daily per PCP for PCOS with IR Has been working on reducing intake of added sugar and starches Has plans to increase walking time  Continue to work on a low sugar diet with weight reduction and an increase in exercise    Class 3 obesity with alveolar hypoventilation and body mass index (BMI) of 40.0 to 44.9 in adult, unspecified whether serious comorbidity present (HCC)  Vitamin D deficiency Assessment & Plan: Last vitamin D Lab Results  Component Value Date   VD25OH 19.4  (L) 09/04/2023   Currently not on a vitamin D supplement  C/o fatigue Vitamin D is important for energy level, immune function and bone health  Start on RX vitamin D 50,000 international units  weekly    Metabolic syndrome Assessment & Plan: Lab Results  Component Value Date   CHOL 142 09/04/2023   HDL 34 (L) 09/04/2023   LDLCALC 68 09/04/2023   TRIG 246 (H) 09/04/2023   CHOLHDL 4.2 09/04/2023    With elevated Tgs, low HDL and an abdominal circumference > 35", pt has metabolic syndrome.  She is actively working on weight loss, reducing intake of added sugar and plans to ramp up walking time  Continue metformin with healthy lifestyle changes   Other specified hypothyroidism Assessment & Plan: Lab Results  Component Value Date   TSH 0.059 (L) 09/04/2023   She has had her levothyroxine dose reduced by Dr Talmage Nap since her last visit She has c/o feeling hot but denies diarrhea or heart palpitations  She plans to have her thyroid labs rechecked by Dr Talmage Nap in November       She was informed of the importance of frequent follow up visits to maximize her success with intensive lifestyle modifications for her multiple health conditions.   ATTESTASTION STATEMENTS:  Reviewed by clinician on day of visit: allergies, medications, problem list, medical history, surgical history, family history, social history, and previous encounter notes pertinent to obesity diagnosis.   I have personally spent 30 minutes total time today in preparation, patient care, nutritional counseling and documentation for this visit, including the following: review of clinical lab tests; review of medical tests/procedures/services.      Whitney Brink, DO DABFM, DABOM [facility] [address] [place], [place] [address] [phone]

## 2023-09-27 NOTE — Assessment & Plan Note (Signed)
Lab Results  Component Value Date   CHOL 142 09/04/2023   HDL 34 (L) 09/04/2023   LDLCALC 68 09/04/2023   TRIG 246 (H) 09/04/2023   CHOLHDL 4.2 09/04/2023    With elevated Tgs, low HDL and an abdominal circumference > 35", pt has metabolic syndrome.  She is actively working on weight loss, reducing intake of added sugar and plans to ramp up walking time  Continue metformin with healthy lifestyle changes

## 2023-10-29 ENCOUNTER — Ambulatory Visit: Payer: BC Managed Care – PPO | Admitting: Family Medicine

## 2023-11-13 ENCOUNTER — Ambulatory Visit: Payer: BC Managed Care – PPO | Admitting: Family Medicine

## 2023-12-05 ENCOUNTER — Ambulatory Visit: Payer: BC Managed Care – PPO | Admitting: Family Medicine

## 2023-12-14 DIAGNOSIS — Z01419 Encounter for gynecological examination (general) (routine) without abnormal findings: Secondary | ICD-10-CM | POA: Diagnosis not present

## 2023-12-14 DIAGNOSIS — Z1331 Encounter for screening for depression: Secondary | ICD-10-CM | POA: Diagnosis not present

## 2023-12-17 ENCOUNTER — Encounter: Payer: Self-pay | Admitting: Family Medicine

## 2023-12-17 ENCOUNTER — Ambulatory Visit: Payer: BC Managed Care – PPO | Admitting: Family Medicine

## 2023-12-17 VITALS — BP 121/82 | HR 59 | Ht 67.0 in | Wt 264.0 lb

## 2023-12-17 DIAGNOSIS — E559 Vitamin D deficiency, unspecified: Secondary | ICD-10-CM

## 2023-12-17 DIAGNOSIS — E66813 Obesity, class 3: Secondary | ICD-10-CM | POA: Diagnosis not present

## 2023-12-17 DIAGNOSIS — E8881 Metabolic syndrome: Secondary | ICD-10-CM | POA: Diagnosis not present

## 2023-12-17 DIAGNOSIS — E282 Polycystic ovarian syndrome: Secondary | ICD-10-CM

## 2023-12-17 DIAGNOSIS — Z6841 Body Mass Index (BMI) 40.0 and over, adult: Secondary | ICD-10-CM

## 2023-12-17 MED ORDER — ZEPBOUND 2.5 MG/0.5ML ~~LOC~~ SOAJ
2.5000 mg | SUBCUTANEOUS | 0 refills | Status: DC
Start: 2023-12-17 — End: 2024-01-15

## 2023-12-17 NOTE — Assessment & Plan Note (Signed)
Lab Results  Component Value Date   CHOL 142 09/04/2023   HDL 34 (L) 09/04/2023   LDLCALC 68 09/04/2023   TRIG 246 (H) 09/04/2023   CHOLHDL 4.2 09/04/2023   Abdominal circumf > 35" She has struggled with weight loss over the past 3 mos of medically supervised weight management  She is on metformin for PCOS She has not considered weight loss surgery  Look for improvements in metabolic syndrome with use of Zepbound + low sugar  reduced kcal diet + regular exercise

## 2023-12-17 NOTE — Assessment & Plan Note (Signed)
Doing well on metformin per PCP for PCOS and IR On OCPs to regulate menses Has continued to struggle with weight loss with increased appetite   Continue metformin Repeat fasting insulin with next labs

## 2023-12-17 NOTE — Progress Notes (Signed)
Office: [phone]  /  Fax: [phone]  WEIGHT SUMMARY AND BIOMETRICS  Starting Date: 09/04/23  Starting Weight: 256lb   Weight Lost Since Last Visit: 0lb   Vitals BP: 121/82 Pulse Rate: (!) 59 SpO2: 100 %   Body Composition  Body Fat %: 44.4 % Fat Mass (lbs): 117.4 lbs Muscle Mass (lbs): 139.6 lbs Total Body Water (lbs): 97.4 lbs Visceral Fat Rating : 11    HPI  Chief Complaint: OBESITY  Whitney Aguirre is here to discuss her progress with her obesity treatment plan. She is on the the Category 3 Plan and states she is following her eating plan approximately 0 % of the time. She states she is exercising 0 minutes 0 times per week.  Interval History:  Since last office visit she is up 7 lb She is net up 8 lb in the past 2.5 mos She came off of Cat 3 meal plan She has feelings of excess hunger Stress levels higher at home vs work She tends to snack more at work when bored or stress Denies skipping meals Protein has been lacking She is drinking more sweet drinks She never started on the Qsymia  She wants to work on water intake and walking time   Pharmacotherapy: mefformin XR 500 mg once daily   PHYSICAL EXAM:  Blood pressure 121/82, pulse (!) 59, height 5\' 7"  (1.702 m), weight 264 lb (119.7 kg), SpO2 100%. Body mass index is 41.35 kg/m.  General: She is overweight, cooperative, alert, well developed, and in no acute distress. PSYCH: Has normal mood, affect and thought process.   Lungs: Normal breathing effort, no conversational dyspnea.   ASSESSMENT AND PLAN  TREATMENT PLAN FOR OBESITY:  Recommended Dietary Goals  Whitney Aguirre is currently in the action stage of change. As such, her goal is to continue weight management plan. She has agreed to the Category 3 Plan and keeping a food journal and adhering to recommended goals of 1600  calories and 100 g of  protein.  Behavioral Intervention  We discussed the following Behavioral Modification Strategies today:  increasing lean protein intake to established goals, increasing fiber rich foods, increasing water intake , work on tracking and journaling calories using tracking application, keeping healthy foods at home, practice mindfulness eating and understand the difference between hunger signals and cravings, work on managing stress, creating time for self-care and relaxation, avoiding temptations and identifying enticing environmental cues, planning for success, and continue to work on maintaining a reduced calorie state, getting the recommended amount of protein, incorporating whole foods, making healthy choices, staying well hydrated and practicing mindfulness when eating..  Additional resources provided today: NA  Recommended Physical Activity Goals  Whitney Aguirre has been advised to work up to 150 minutes of moderate intensity aerobic activity a week and strengthening exercises 2-3 times per week for cardiovascular health, weight loss maintenance and preservation of muscle mass.   She has agreed to [employer] strengthening exercises with a goal of 2-3 sessions a week   Pharmacotherapy changes for the treatment of obesity: begin Zepbound 2.5 mg weekly injection Patient denies a personal or family history of pancreatitis, medullary thyroid carcinoma or multiple endocrine neoplasia type II. Recommend reviewing pen training video online. Recommend back up birth control in addition to OCPs Will use along with a reduced kcal diet + regular exercise  ASSOCIATED CONDITIONS ADDRESSED TODAY  Metabolic syndrome Assessment & Plan: Lab Results  Component Value Date   CHOL 142 09/04/2023   HDL 34 (L) 09/04/2023  LDLCALC 68 09/04/2023   TRIG 246 (H) 09/04/2023   CHOLHDL 4.2 09/04/2023   Abdominal circumf > 35" She has struggled with weight loss over the past 3 mos of medically supervised weight management  She is on metformin for PCOS She has not considered weight loss surgery  Look for improvements in metabolic  syndrome with use of Zepbound + low sugar  reduced kcal diet + regular exercise   Class 3 severe obesity due to excess calories with serious comorbidity and body mass index (BMI) of 40.0 to 44.9 in adult (HCC) -     Zepbound; Inject 2.5 mg into the skin once a week.  Dispense: 2 mL; Refill: 0  Vitamin D deficiency  PCOS (polycystic ovarian syndrome) Assessment & Plan: Doing well on metformin per PCP for PCOS and IR On OCPs to regulate menses Has continued to struggle with weight loss with increased appetite   Continue metformin Repeat fasting insulin with next labs       She was informed of the importance of frequent follow up visits to maximize her success with intensive lifestyle modifications for her multiple health conditions.   ATTESTASTION STATEMENTS:  Reviewed by clinician on day of visit: allergies, medications, problem list, medical history, surgical history, family history, social history, and previous encounter notes pertinent to obesity diagnosis.   I have personally spent 30 minutes total time today in preparation, patient care, nutritional counseling and documentation for this visit, including the following: review of clinical lab tests; review of medical tests/procedures/services.      Glennis Brink, DO DABFM, DABOM [facility] [address] [place], [place] [address] [phone]

## 2023-12-18 ENCOUNTER — Telehealth: Payer: Self-pay | Admitting: *Deleted

## 2023-12-18 NOTE — Telephone Encounter (Signed)
Prior authorization done via cover my meds for patients Zepbound. It was approved.  Outcome Approved today by [facility] 2017 CaseId:[id number];Status:Approved;Review Type:Prior Auth;Coverage Start Date:11/18/2023;Coverage End Date:08/14/2024; Authorization Expiration Date: 08/13/2024

## 2023-12-19 ENCOUNTER — Telehealth: Payer: Self-pay | Admitting: *Deleted

## 2023-12-19 DIAGNOSIS — E66813 Obesity, class 3: Secondary | ICD-10-CM

## 2023-12-31 NOTE — Telephone Encounter (Signed)
error 

## 2024-01-15 ENCOUNTER — Telehealth: Payer: Self-pay | Admitting: *Deleted

## 2024-01-15 ENCOUNTER — Ambulatory Visit: Payer: BC Managed Care – PPO | Admitting: Family Medicine

## 2024-01-15 ENCOUNTER — Encounter: Payer: Self-pay | Admitting: Family Medicine

## 2024-01-15 VITALS — BP 119/81 | HR 55 | Temp 98.3°F | Ht 67.0 in | Wt 258.0 lb

## 2024-01-15 DIAGNOSIS — E039 Hypothyroidism, unspecified: Secondary | ICD-10-CM

## 2024-01-15 DIAGNOSIS — Z6841 Body Mass Index (BMI) 40.0 and over, adult: Secondary | ICD-10-CM

## 2024-01-15 DIAGNOSIS — R5383 Other fatigue: Secondary | ICD-10-CM

## 2024-01-15 DIAGNOSIS — R110 Nausea: Secondary | ICD-10-CM | POA: Diagnosis not present

## 2024-01-15 DIAGNOSIS — E559 Vitamin D deficiency, unspecified: Secondary | ICD-10-CM | POA: Diagnosis not present

## 2024-01-15 DIAGNOSIS — E88819 Insulin resistance, unspecified: Secondary | ICD-10-CM

## 2024-01-15 DIAGNOSIS — E66813 Obesity, class 3: Secondary | ICD-10-CM

## 2024-01-15 MED ORDER — ZEPBOUND 2.5 MG/0.5ML ~~LOC~~ SOAJ: 2.5000 mg | SUBCUTANEOUS | 0 refills | Status: DC

## 2024-01-15 NOTE — Telephone Encounter (Signed)
 I sent her Zepbound to [facility] today.  Let me know if any problems getting it in the next 7 days.

## 2024-01-15 NOTE — Telephone Encounter (Signed)
 Phone call to [facility] in [place] to check stock of Zepbound. They do not have it in stock but they order it once a prescription comes in. Dr. Cathey Endow notified.

## 2024-01-15 NOTE — Progress Notes (Signed)
 Office: [phone]  /  Fax: [phone]  WEIGHT SUMMARY AND BIOMETRICS  Starting Date: 09/04/23  Starting Weight: 256lb   Weight Lost Since Last Visit: 6lb   Vitals Temp: 98.3 F (36.8 C) BP: 119/81 Pulse Rate: (!) 55 SpO2: 98 %   Body Composition  Body Fat %: 43.3 % Fat Mass (lbs): 111.8 lbs Muscle Mass (lbs): 139 lbs Total Body Water (lbs): 93.4 lbs Visceral Fat Rating : 11   HPI  Chief Complaint: OBESITY  Whitney Aguirre is here to discuss her progress with her obesity treatment plan. She is on the the Category 3 Plan and states she is following her eating plan approximately 0 % of the time. She states she is doing some walking.   Interval History:  Since last office visit she is down 6 lb She has been off her meal plan Her mom is moving out.  Her husband is supportive Stress levels have been high She is currently sick with a respiratory infection She has been having morning nausea w/o vomiting started before infection  She has not yet started Zepbound -- couldn't find at [facility] Energy level is low  Pharmacotherapy: Metformin XR 1000 mg daily  PHYSICAL EXAM:  Blood pressure 119/81, pulse (!) 55, temperature 98.3 F (36.8 C), height 5\' 7"  (1.702 m), weight 258 lb (117 kg), SpO2 98%. Body mass index is 40.41 kg/m.  General: She is overweight, cooperative, alert, well developed, and in no acute distress. PSYCH: Has normal mood, affect and thought process.   Lungs: Normal breathing effort, no conversational dyspnea.   ASSESSMENT AND PLAN  TREATMENT PLAN FOR OBESITY:  Recommended Dietary Goals  [name] is currently in the action stage of change. As such, her goal is to continue weight management plan. She has agreed to keeping a food journal and adhering to recommended goals of 1600 calories and 100 g of  protein.  Behavioral Intervention  We discussed the following Behavioral Modification Strategies today: increasing lean protein intake to established  goals, increasing fiber rich foods, increasing water intake , work on meal planning and preparation, work on Counselling psychologist calories using tracking application, keeping healthy foods at home, practice mindfulness eating and understand the difference between hunger signals and cravings, work on managing stress, creating time for self-care and relaxation, avoiding temptations and identifying enticing environmental cues, continue to practice mindfulness when eating, and continue to work on maintaining a reduced calorie state, getting the recommended amount of protein, incorporating whole foods, making healthy choices, staying well hydrated and practicing mindfulness when eating..  Additional resources provided today: NA  Recommended Physical Activity Goals  [name] has been advised to work up to 150 minutes of moderate intensity aerobic activity a week and strengthening exercises 2-3 times per week for cardiovascular health, weight loss maintenance and preservation of muscle mass.   She has agreed to Think about enjoyable ways to increase daily physical activity and overcoming barriers to exercise and Increase physical activity in their day and reduce sedentary time (increase NEAT).  Pharmacotherapy changes for the treatment of obesity: start Zepbound 2.5 mg weekly (RX sent last visit, has not yet started)  ASSOCIATED CONDITIONS ADDRESSED TODAY  Vitamin D deficiency Last vitamin D Lab Results  Component Value Date   VD25OH 19.4 (L) 09/04/2023  She has been taking vitamin D 50,000 IU once weekly.  Energy level remains low.  Recheck vitamin D level today.  -     VITAMIN D 25 Hydroxy (Vit-D Deficiency, Fractures)  Insulin resistance  She is on metformin XR 1000 mg once daily with food, tolerating well for insulin resistance related to PCOS, prescribed by her PCP.  She admits to struggling to comply with a low sugar/low-carb diet and has not yet started regular exercise.  She has not yet  started Zepbound 2.5 mg once weekly injection.  Repeat labs today and work on dietary logging.  Once she is feeling better, will add in regular walking. -     Insulin, random  Nausea She reports morning nausea without vomiting.  She has some associated right upper quadrant pain.  She still has a gallbladder.  She takes pantoprazole at bedtime for acid reflux.  She has not yet started Zepbound.  She is not taking any medication for as needed use.  She is not hungry in the morning.    Plan: Continue pantoprazole at bedtime.  Avoid late night meals and high acid foods.  Obtain a chemistry panel today.  Recommend sipping on water in the morning and trying ginger chews.  Eat a light breakfast within 2 hours of waking. -     Comprehensive metabolic panel  Other fatigue Fatigue has worsened lately due to current respiratory infection.  She has been taking her vitamin D 50,000 IU once weekly.  She is not taking a B12 supplement though her level was low normal on her last set of labs.  She is not taking a multivitamin either.  She denies heavy menses. -     Ferritin -     Iron and TIBC -     CBC  Acquired hypothyroidism Lab Results  Component Value Date   TSH 0.059 (L) 09/04/2023   She reports good compliance taking levothyroxine 150 mcg daily.  Her prescription was adjusted by her PCP after her last TSH was low.  She has not been feeling any better.  Recheck labs today.  Will CC her PCP.  -     TSH Rfx on Abnormal to Free T4  Class 3 severe obesity due to excess calories with serious comorbidity and body mass index (BMI) of 40.0 to 44.9 in adult (HCC) -     Zepbound; Inject 2.5 mg into the skin once a week.  Dispense: 2 mL; Refill: 0      She was informed of the importance of frequent follow up visits to maximize her success with intensive lifestyle modifications for her multiple health conditions.   ATTESTASTION STATEMENTS:  Reviewed by clinician on day of visit: allergies, medications,  problem list, medical history, surgical history, family history, social history, and previous encounter notes pertinent to obesity diagnosis.   I have personally spent 30 minutes total time today in preparation, patient care, nutritional counseling and documentation for this visit, including the following: review of clinical lab tests; review of medical tests/procedures/services.      Glennis Brink, DO DABFM, DABOM [facility] [address] [place], [place] [address] [phone]

## 2024-01-16 LAB — CBC
Hematocrit: 45.7 % (ref 34.0–46.6)
Hemoglobin: 14.7 g/dL (ref 11.1–15.9)
MCH: 28.4 pg (ref 26.6–33.0)
MCHC: 32.2 g/dL (ref 31.5–35.7)
MCV: 88 fL (ref 79–97)
Platelets: 322 10*3/uL (ref 150–450)
RBC: 5.17 x10E6/uL (ref 3.77–5.28)
RDW: 13.9 % (ref 11.7–15.4)
WBC: 6.8 10*3/uL (ref 3.4–10.8)

## 2024-01-16 LAB — IRON AND TIBC
Iron Saturation: 24 % (ref 15–55)
Iron: 73 ug/dL (ref 27–159)
Total Iron Binding Capacity: 300 ug/dL (ref 250–450)
UIBC: 227 ug/dL (ref 131–425)

## 2024-01-16 LAB — COMPREHENSIVE METABOLIC PANEL
ALT: 24 [IU]/L (ref 0–32)
AST: 17 [IU]/L (ref 0–40)
Albumin: 4.3 g/dL (ref 4.0–5.0)
Alkaline Phosphatase: 79 [IU]/L (ref 44–121)
BUN/Creatinine Ratio: 11 (ref 9–23)
BUN: 10 mg/dL (ref 6–20)
Bilirubin Total: 0.4 mg/dL (ref 0.0–1.2)
CO2: 25 mmol/L (ref 20–29)
Calcium: 9.4 mg/dL (ref 8.7–10.2)
Chloride: 105 mmol/L (ref 96–106)
Creatinine, Ser: 0.89 mg/dL (ref 0.57–1.00)
Globulin, Total: 2.8 g/dL (ref 1.5–4.5)
Glucose: 97 mg/dL (ref 70–99)
Potassium: 4.7 mmol/L (ref 3.5–5.2)
Sodium: 143 mmol/L (ref 134–144)
Total Protein: 7.1 g/dL (ref 6.0–8.5)
eGFR: 89 mL/min/{1.73_m2} (ref >59)

## 2024-01-16 LAB — FERRITIN: Ferritin: 61 ng/mL (ref 15–150)

## 2024-01-16 LAB — VITAMIN D 25 HYDROXY (VIT D DEFICIENCY, FRACTURES): Vit D, 25-Hydroxy: 15.1 ng/mL — ABNORMAL LOW (ref 30.0–100.0)

## 2024-01-16 LAB — T4F: T4,Free (Direct): 0.86 ng/dL (ref 0.82–1.77)

## 2024-01-16 LAB — INSULIN, RANDOM: INSULIN: 29.9 u[IU]/mL — ABNORMAL HIGH (ref 2.6–24.9)

## 2024-01-16 LAB — TSH RFX ON ABNORMAL TO FREE T4: TSH: 49.5 u[IU]/mL — ABNORMAL HIGH (ref 0.450–4.500)

## 2024-01-16 MED ORDER — VITAMIN D (ERGOCALCIFEROL) 1.25 MG (50000 UNIT) PO CAPS: 50000.0000 [IU] | ORAL_CAPSULE | ORAL | 0 refills | Status: DC

## 2024-01-16 NOTE — Addendum Note (Signed)
 Addended by: Glennis Brink on: 01/16/2024 09:45 AM   Modules accepted: Orders

## 2024-01-22 ENCOUNTER — Other Ambulatory Visit: Payer: Self-pay | Admitting: Family Medicine

## 2024-01-22 DIAGNOSIS — R1013 Epigastric pain: Secondary | ICD-10-CM

## 2024-01-25 NOTE — Progress Notes (Signed)
 NEUROLOGY FOLLOW UP OFFICE NOTE  Tulsi Crossett [id number]  Assessment/Plan:   Tension-type headache, not intractable      1  Headache prevention:  duloxetine 30mg  daily  2  Headache rescue:  Excedrin first line, Aleve second line.  She is not at risk for rebound headache, so I told her she may go ahead and treat at earliest onset of headache. 3  Limit use of pain relievers to no more than 2 days out of week to prevent risk of rebound or medication-overuse headache. 4  Keep headache diary 5  Follow up 6 months  Subjective:  Cashlyn ASHLIN KREPS is a 33 year old right-handed female with Hashimoto's thyroiditis and PCOS and task specific dystonia who follows up for headache and task-specific dystonia.  UPDATE: Due to lethargy on nortriptyline, she was changed to duloxetine. Improved. Intensity:  5/10  Duration:  2 hours with Excedrin or Aleve (first and second line), often avoids taking something and will last a few hours.   Frequency:  4 to 5 a month, triggered by the lights at work.   Through her dentist, she is getting a mouth guard and bottom retainer.    Current NSAIDS/analgesics:  Excedrin Migrane/Tension, Aleve Current triptans:  none Current ergotamine:  none Current anti-emetic:  Zofran 8mg  Current muscle relaxants:  Flexeril 10mg  at bedtime (sciatic/back pain) Current Antihypertensive medications:  metoprolol tartrate Current Antidepressant medications:  duloxetine 30mg  daily Current Anticonvulsant medications:  none Current anti-CGRP:  none Current Vitamins/Herbal/Supplements:  none Current Antihistamines/Decongestants:  none Other therapy:  none Hormone/birth control:  Loestrin Fe Other medications:  levothyroxine, metformin  Caffeine:  1 20 oz cup coffee daily.  Sometimes soda Alcohol:  occasional Smoker:  no Diet:  48 to 64 oz water daily.  Does not skip meals Exercise:  not routine Depression/Anxiety:  anxiety Other pain:  no Sleep hygiene:  poor.  no  more than 5-6 hours a night.  Stress.  Cannot shut of brain.    HISTORY:  Tension-type headache: Onset:  headaches since she was 33 years old. Location:  usually forehead, top or back of head at base of neck Quality:  usually pressure, rarely throbbing vs pounding vs stabbling Intensity:  5-6/10.  She denies new headache, thunderclap headache or severe headache that wakes her from sleep. Aura:  absent Prodrome:  absent Associated symptoms:  No photophobia, phonophobia.  Rarely nausea.  She denies associated vomiting, visual disturbance, unilateral numbness or weakness. Duration:  Usually 30 to 60 minutes but may last up to 5-6 hours Frequency:  daily until starting nortriptyline.  Started nortriptyline on 11/03/2021.  Now approximately 1 to 2 days a week.   Frequency of abortive medication: 1 to 2 days a week. Triggers:  stress, lights Relieving factors:  Excedrin Activity:  able to function  Isolated headache: It happened in 2024, a left sided pressure/stabbing headache while driving that lasted just 5 minutes.  No nausea, vomiting, photophobia, phonophobia or visual disturbance.  No neck pain at that time.  No recurrence.   Task-specific dystonia: She started noticing a tremor in her hands in 2022.  Happens at work when she is using her hands, such as at the computer or writing.  She feels the tremor start in hand and she needs to stop and rest until it passes.  Thumb starts to twitch and intrinsic hand muscles spasm.  Hand is in a flexed position.  Happens in both hands but mostly the left hand.  Lasts 1 to 4  minutes.  Needs to stop and let it pass.  No pain, weakness or numbness.  Occurs 1 to 2 times a week.  Denies family history of tremor.  Thyroid has been controlled.        Past NSAIDS/analgesics:  ibuprofen, naproxen, acetaminophen Past abortive triptans:  none Past abortive ergotamine:  none Past muscle relaxants:  none Past anti-emetic:  none Past antihypertensive  medications:  none Past antidepressant medications:  amitriptyline, nortriptyline (lethargy) Past anticonvulsant medications:  topiramate Past anti-CGRP:  none Past vitamins/Herbal/Supplements:  none Past antihistamines/decongestants:  Flonase, Sudafed Other past therapies:  none    Family history of headache:  mom (severe migraines)  PAST MEDICAL HISTORY: Past Medical History:  Diagnosis Date   ACL tear 05/12/2014   injured on trampoline   Anemia    Anxiety    Depression    Hashimoto's disease 2018   Headache(784.0)    stress   Heartburn    Hypothyroid    Palpitations    Polycystic ovarian syndrome    no current med.   Thyroid disease     MEDICATIONS: Current Outpatient Medications on File Prior to Visit  Medication Sig Dispense Refill   cyclobenzaprine (FLEXERIL) 10 MG tablet Take 1 tablet (10 mg total) by mouth at bedtime. 15 tablet 0   DULoxetine (CYMBALTA) 30 MG capsule Take 1 capsule (30 mg total) by mouth daily. 30 capsule 5   levothyroxine (SYNTHROID) 150 MCG tablet Take 150 mcg by mouth daily in the afternoon. 2 tablets Monday- Saturday and 1 tablet on Sunday     metFORMIN (GLUCOPHAGE-XR) 500 MG 24 hr tablet Take 1 tablet (500 mg total) by mouth daily with breakfast. (Patient taking differently: Take 1,000 mg by mouth daily with breakfast.) 30 tablet 0   metoprolol tartrate (LOPRESSOR) 25 MG tablet Take 0.5 tablets (12.5 mg total) by mouth 2 (two) times daily as needed (for palpitations). 30 tablet 4   norethindrone-ethinyl estradiol-FE (LOESTRIN FE) 1-20 MG-MCG tablet Take 1 tablet by mouth daily.     nortriptyline (PAMELOR) 25 MG capsule Take 1 capsule (25 mg total) by mouth at bedtime. 7 capsule 0   pantoprazole (PROTONIX) 40 MG tablet Take 1 tablet (40 mg total) by mouth daily. Due for physical/follow up 30 tablet 0   tirzepatide (ZEPBOUND) 2.5 MG/0.5ML Pen Inject 2.5 mg into the skin once a week. 2 mL 0   Vitamin D, Ergocalciferol, (DRISDOL) 1.25 MG (50000  UNIT) CAPS capsule Take 1 capsule (50,000 Units total) by mouth every 7 (seven) days. 5 capsule 0   No current facility-administered medications on file prior to visit.    ALLERGIES: No Known Allergies  FAMILY HISTORY: Family History  Problem Relation Age of Onset   Hyperlipidemia Mother    Hypertension Mother    Cardiomyopathy Mother    High blood pressure Mother    Emphysema Mother    Heart disease Mother    Sudden death Mother    Kidney disease Mother    Obesity Mother    Ulcers Father    Alcoholism Father    Drug abuse Father    Cancer Paternal Grandfather    Diabetes Other    Hypertension Other    Liver disease Neg Hx    Colon cancer Neg Hx    Esophageal cancer Neg Hx       Objective:  Blood pressure 124/66, pulse 61, height 5\' 7"  (1.702 m), weight 262 lb (118.8 kg), SpO2 97%. General: No acute distress.  Patient appears well-groomed.  Head:  Normocephalic/atraumatic Neck:  Supple.  No paraspinal tenderness.  Full range of motion. Heart:  Regular rate and rhythm. Neuro:  Alert and oriented.  Speech fluent and not dysarthric.  Language intact.  CN II-XII intact.  Bulk and tone normal.  Muscle strength 5/5 throughout.  Deep tendon reflexes 2+ throughout.  Gait normal.  Romberg negative.    Shon Millet, DO  CC: Betty [place], MD

## 2024-01-29 ENCOUNTER — Encounter: Payer: Self-pay | Admitting: Neurology

## 2024-01-29 ENCOUNTER — Ambulatory Visit: Payer: BC Managed Care – PPO | Admitting: Neurology

## 2024-01-29 VITALS — BP 124/66 | HR 61 | Ht 67.0 in | Wt 262.0 lb

## 2024-01-29 DIAGNOSIS — G249 Dystonia, unspecified: Secondary | ICD-10-CM | POA: Diagnosis not present

## 2024-01-29 DIAGNOSIS — G44219 Episodic tension-type headache, not intractable: Secondary | ICD-10-CM

## 2024-01-29 MED ORDER — DULOXETINE HCL 30 MG PO CPEP: 30.0000 mg | ORAL_CAPSULE | Freq: Every day | ORAL | 5 refills | Status: DC

## 2024-02-11 ENCOUNTER — Other Ambulatory Visit: Payer: Self-pay | Admitting: Family Medicine

## 2024-02-11 DIAGNOSIS — E559 Vitamin D deficiency, unspecified: Secondary | ICD-10-CM

## 2024-02-12 ENCOUNTER — Encounter: Payer: Self-pay | Admitting: Family Medicine

## 2024-02-12 ENCOUNTER — Ambulatory Visit: Payer: BC Managed Care – PPO | Admitting: Family Medicine

## 2024-02-12 VITALS — BP 112/69 | HR 65 | Temp 98.1°F | Ht 67.0 in | Wt 254.0 lb

## 2024-02-12 DIAGNOSIS — E66813 Obesity, class 3: Secondary | ICD-10-CM

## 2024-02-12 DIAGNOSIS — E559 Vitamin D deficiency, unspecified: Secondary | ICD-10-CM

## 2024-02-12 DIAGNOSIS — E88819 Insulin resistance, unspecified: Secondary | ICD-10-CM

## 2024-02-12 DIAGNOSIS — R110 Nausea: Secondary | ICD-10-CM | POA: Diagnosis not present

## 2024-02-12 DIAGNOSIS — Z6841 Body Mass Index (BMI) 40.0 and over, adult: Secondary | ICD-10-CM

## 2024-02-12 DIAGNOSIS — E039 Hypothyroidism, unspecified: Secondary | ICD-10-CM

## 2024-02-12 MED ORDER — VITAMIN D (ERGOCALCIFEROL) 1.25 MG (50000 UNIT) PO CAPS: 50000.0000 [IU] | ORAL_CAPSULE | ORAL | 0 refills | Status: DC

## 2024-02-12 MED ORDER — ONDANSETRON 8 MG PO TBDP: 8.0000 mg | ORAL_TABLET | Freq: Three times a day (TID) | ORAL | 1 refills | Status: DC | PRN

## 2024-02-12 MED ORDER — ZEPBOUND 2.5 MG/0.5ML ~~LOC~~ SOAJ: 2.5000 mg | SUBCUTANEOUS | 0 refills | Status: DC

## 2024-02-12 NOTE — Progress Notes (Signed)
 Office: [phone]  /  Fax: [phone]  WEIGHT SUMMARY AND BIOMETRICS  Starting Date: 09/04/23  Starting Weight: 256lb   Weight Lost Since Last Visit: 4lb   Vitals Temp: 98.1 F (36.7 C) BP: 112/69 Pulse Rate: 65 SpO2: 95 %   Body Composition  Body Fat %: 41.7 % Fat Mass (lbs): 106.2 lbs Muscle Mass (lbs): 141.2 lbs Total Body Water (lbs): 92.8 lbs Visceral Fat Rating : 10     HPI  Chief Complaint: OBESITY  Whitney Aguirre is here to discuss her progress with her obesity treatment plan. She is on the the Category 3 Plan and states she is following her eating plan approximately 0 % of the time. She states she is exercising 30 minutes 2 times per week.   Interval History:  Since last office visit she is down 4 lb She is up 2.2 lb of muscle mass and down 6.6 lb of body fat in the past month This gives her a net weight loss of 2 lb in the past 5 mos She started on Zepbound 2.5 mg weekly 3 weeks ago She has felt much improvement in appetite control She even skipped meals frequently after her first injection but this has improved She is able to get small meals with a focus on lean protein now She is able to hydrate well with water She limits meals out and greasy foods Denies heartburn but has had some diarrhea and nausea  Pharmacotherapy: Zepbound 2.5 mg weekly  PHYSICAL EXAM:  Blood pressure 112/69, pulse 65, temperature 98.1 F (36.7 C), height 5\' 7"  (1.702 m), weight 254 lb (115.2 kg), SpO2 95%. Body mass index is 39.78 kg/m.  General: She is overweight, cooperative, alert, well developed, and in no acute distress. PSYCH: Has normal mood, affect and thought process.   Lungs: Normal breathing effort, no conversational dyspnea.   ASSESSMENT AND PLAN  TREATMENT PLAN FOR OBESITY:  Recommended Dietary Goals  [name] is currently in the action stage of change. As such, her goal is to continue weight management plan. She has agreed to keeping a food journal and  adhering to recommended goals of 1500 calories and 90+ g of  protein and following a lower carbohydrate, vegetable and lean protein rich diet plan.  Behavioral Intervention  We discussed the following Behavioral Modification Strategies today: increasing lean protein intake to established goals, increasing fiber rich foods, avoiding skipping meals, increasing water intake , work on tracking and journaling calories using tracking application, keeping healthy foods at home, identifying sources and decreasing liquid calories, work on managing stress, creating time for self-care and relaxation, avoiding temptations and identifying enticing environmental cues, and continue to work on maintaining a reduced calorie state, getting the recommended amount of protein, incorporating whole foods, making healthy choices, staying well hydrated and practicing mindfulness when eating..  Additional resources provided today: NA  Recommended Physical Activity Goals  [name] has been advised to work up to 150 minutes of moderate intensity aerobic activity a week and strengthening exercises 2-3 times per week for cardiovascular health, weight loss maintenance and preservation of muscle mass.   She has agreed to Think about enjoyable ways to increase daily physical activity and overcoming barriers to exercise and Increase physical activity in their day and reduce sedentary time (increase NEAT).  Pharmacotherapy changes for the treatment of obesity: none  ASSOCIATED CONDITIONS ADDRESSED TODAY  Nausea Worsened with start of Zepbound Recommend small meals  Avoid greasy foods, late night eating Can use OTC ginger chews prn  and will send in:  -     Ondansetron; Take 1 tablet (8 mg total) by mouth every 8 (eight) hours as needed.  Dispense: 20 tablet; Refill: 1  Class 3 severe obesity due to excess calories with serious comorbidity and body mass index (BMI) of 40.0 to 44.9 in adult [facility]) She has adequate satiety with  some GI side effects on Zebpound 2.5 mg dose.  Will not increase her dose.weight is now responding to creation of caloric deficit. -     Zepbound; Inject 2.5 mg into the skin once a week.  Dispense: 2 mL; Refill: 0  Vitamin D deficiency Last vitamin D Lab Results  Component Value Date   VD25OH 15.1 (L) 01/15/2024  Reviewed lab from last visit Vitamin D remains low. She has had some sporadic intake of vitamin D 50,000 international units  weekly Will continue for now.  Labs printed for her upcoming endocrinology visit Consider changing her to a vitamin D3+K2 preparation -     Vitamin D (Ergocalciferol); Take 1 capsule (50,000 Units total) by mouth every 7 (seven) days.  Dispense: 5 capsule; Refill: 0  Insulin resistance Improving.  Fasting insulin 53.8--> 29,9 with dietary change, adding in 2 days/ wk of exercise, use of metformin XR 1000 mg / day and now the addition of Zepbound.  Actively working on weight reduction.  Has started to lose on visceral fat rating.  Continue healthy lifestyle changes. Will reduce metformin XR back to 500 mg/ day now that she is on Zepbound 2.5 mg weekly and has added in regular exercise due to the added GI SE with looser stools on the higher dose of metformin.  Acquired hypothyroidism Lab Results  Component Value Date   TSH 49.500 (H) 01/15/2024  She reports fairly good compliance on levothyroxine 150 mcg daily but her TSH went from too low to too hight. She is managed by Dr Talmage Nap and has upcoming visit tomorrow Labs printed for patient to bring in with her to adjust medication as needed She denies extreme fatigue, constipation or cold intolerance.    She was informed of the importance of frequent follow up visits to maximize her success with intensive lifestyle modifications for her multiple health conditions.   ATTESTASTION STATEMENTS:  Reviewed by clinician on day of visit: allergies, medications, problem list, medical history, surgical history,  family history, social history, and previous encounter notes pertinent to obesity diagnosis.   I have personally spent 30 minutes total time today in preparation, patient care, nutritional counseling and documentation for this visit, including the following: review of clinical lab tests; review of medical tests/procedures/services.      Glennis Brink, DO DABFM, DABOM [facility] [address] [place], [place] [address] [phone]

## 2024-02-13 DIAGNOSIS — R7301 Impaired fasting glucose: Secondary | ICD-10-CM | POA: Diagnosis not present

## 2024-02-13 DIAGNOSIS — E282 Polycystic ovarian syndrome: Secondary | ICD-10-CM | POA: Diagnosis not present

## 2024-02-13 DIAGNOSIS — E039 Hypothyroidism, unspecified: Secondary | ICD-10-CM | POA: Diagnosis not present

## 2024-02-25 DIAGNOSIS — E669 Obesity, unspecified: Secondary | ICD-10-CM | POA: Diagnosis not present

## 2024-02-25 DIAGNOSIS — E282 Polycystic ovarian syndrome: Secondary | ICD-10-CM | POA: Diagnosis not present

## 2024-02-25 DIAGNOSIS — R7301 Impaired fasting glucose: Secondary | ICD-10-CM | POA: Diagnosis not present

## 2024-02-25 DIAGNOSIS — E039 Hypothyroidism, unspecified: Secondary | ICD-10-CM | POA: Diagnosis not present

## 2024-03-17 ENCOUNTER — Ambulatory Visit: Admitting: Family Medicine

## 2024-03-26 ENCOUNTER — Ambulatory Visit: Admitting: Family Medicine

## 2024-03-27 ENCOUNTER — Encounter: Payer: Self-pay | Admitting: Family Medicine

## 2024-03-27 ENCOUNTER — Ambulatory Visit: Admitting: Family Medicine

## 2024-03-27 VITALS — BP 130/84 | HR 59 | Temp 97.5°F | Ht 67.0 in | Wt 260.0 lb

## 2024-03-27 DIAGNOSIS — E66813 Obesity, class 3: Secondary | ICD-10-CM

## 2024-03-27 DIAGNOSIS — E8881 Metabolic syndrome: Secondary | ICD-10-CM | POA: Diagnosis not present

## 2024-03-27 DIAGNOSIS — E559 Vitamin D deficiency, unspecified: Secondary | ICD-10-CM | POA: Diagnosis not present

## 2024-03-27 DIAGNOSIS — E88819 Insulin resistance, unspecified: Secondary | ICD-10-CM

## 2024-03-27 DIAGNOSIS — Z6841 Body Mass Index (BMI) 40.0 and over, adult: Secondary | ICD-10-CM

## 2024-03-27 MED ORDER — VITAMIN D (ERGOCALCIFEROL) 1.25 MG (50000 UNIT) PO CAPS
50000.0000 [IU] | ORAL_CAPSULE | ORAL | 0 refills | Status: DC
Start: 2024-03-27 — End: 2024-05-01

## 2024-03-27 MED ORDER — ZEPBOUND 5 MG/0.5ML ~~LOC~~ SOAJ: 5.0000 mg | SUBCUTANEOUS | 0 refills | Status: DC

## 2024-03-27 NOTE — Progress Notes (Signed)
 Office: [phone]  /  Fax: [phone]  WEIGHT SUMMARY AND BIOMETRICS  Starting Date: 09/04/23  Starting Weight: 256lb   Weight Lost Since Last Visit: 0lb   Vitals Temp: (!) 97.5 F (36.4 C) BP: 130/84 Pulse Rate: (!) 59 SpO2: 98 %   Body Composition  Body Fat %: 45 % Fat Mass (lbs): 117.2 lbs Muscle Mass (lbs): 136 lbs Total Body Water (lbs): 100.8 lbs Visceral Fat Rating : 11   HPI  Chief Complaint: OBESITY  Hydie is here to discuss her progress with her obesity treatment plan. She is on the the Category 3 Plan and states she is following her eating plan approximately 0 % of the time. She states she is exercising 30 minutes 2-3 times per week.  Interval History:  Since last office visit she is up 6 lb She did do a month of Zepbound  2.5 mg weekly (4 injections) She did run out 2 weeks ago and has been increased hunger She did have some sulfur burps and nausea She did get braces in early April She had had a lot of stress with emotional and boredom eating Her husband is a junk food eater at times She has been working on planning dinner She has increased exercise but has been inconsistent in getting walking and while at work and continues to struggle bringing lunch to work, often eating out   Pharmacotherapy: metformin  XR 500 mg daily   PHYSICAL EXAM:  Blood pressure 130/84, pulse (!) 59, temperature (!) 97.5 F (36.4 C), height 5\' 7"  (1.702 m), weight 260 lb (117.9 kg), SpO2 98%. Body mass index is 40.72 kg/m.  General: She is overweight, cooperative, alert, well developed, and in no acute distress. PSYCH: Has normal mood, affect and thought process.   Lungs: Normal breathing effort, no conversational dyspnea.   ASSESSMENT AND PLAN  TREATMENT PLAN FOR OBESITY:  Recommended Dietary Goals  Lala is currently in the action stage of change. As such, her goal is to continue weight management plan. She has agreed to the Category 3 Plan.  Behavioral  Intervention  We discussed the following Behavioral Modification Strategies today: increasing lean protein intake to established goals, increasing fiber rich foods, avoiding skipping meals, increasing water intake , work on meal planning and preparation, work on Counselling psychologist calories using tracking application, keeping healthy foods at home, planning for success, and continue to work on maintaining a reduced calorie state, getting the recommended amount of protein, incorporating whole foods, making healthy choices, staying well hydrated and practicing mindfulness when eating.. Bring lunch to work daily Plan out dinners with husband on the weekends  Additional resources provided today: NA  Recommended Physical Activity Goals  Levern has been advised to work up to 150 minutes of moderate intensity aerobic activity a week and strengthening exercises 2-3 times per week for cardiovascular health, weight loss maintenance and preservation of muscle mass.   She has agreed to Start aerobic activity with a goal of 150 minutes a week at moderate intensity.  Make time for walking on lunch break and in the evening with a goal of 30+ minutes 5 days a week  Pharmacotherapy changes for the treatment of obesity: Resume Zepbound  increasing to  5 mg once weekly injection  ASSOCIATED CONDITIONS ADDRESSED TODAY  Insulin  resistance Last fasting insulin  elevated at 29.9, down from previous of 53.8.  She has associated PCOS and has done well on metformin .  She did reduce her dose from metformin  XR 1000 mg once daily  to 500 mg once daily.  She does have fair compliance with taking her metformin  and denies adverse side effects.  Continue to work on reducing added sugar and starches while increasing regular exercise for the treatment of insulin  resistance.  Continue metformin  XR 500 mg once daily.  Plan to repeat fasting insulin  in 2 to 3 months.  Vitamin D  deficiency Last vitamin D  Lab Results  Component  Value Date   VD25OH 15.1 (L) 01/15/2024  She has been taking vitamin D  50,000 IU once weekly for vitamin D  deficiency.  Her energy level is fair.  She denies adverse side effects and will be due for repeat vitamin D  level in 2 to 3 months.  -     Vitamin D  (Ergocalciferol ); Take 1 capsule (50,000 Units total) by mouth every 7 (seven) days.  Dispense: 5 capsule; Refill: 0  Class 3 severe obesity due to excess calories with body mass index (BMI) of 40.0 to 44.9 in adult, unspecified whether serious comorbidity present (HCC) -     Zepbound ; Inject 5 mg into the skin once a week.  Dispense: 6 mL; Refill: 0 She tolerated Zepbound  2.5 mg once weekly injection with only mild nausea.  She has Zofran  to use for as needed use.  Her lack of weight loss was likely due to running out of her medications 2 weeks ago, psychosocial stressors at home and lack of increasing dose after 4 weeks  Call if any problems or questions on Zepbound  5 mg once weekly injection.  For insurance reasons, she will require a 69-month prescription  Metabolic syndrome Jessyka  is working on reducing her intake of added sugar and refined carbohydrates, regular exercise and weight loss.  She has findings of metabolic syndrome with an elevated abdominal circumference over 35 inches, elevated triglyceride over 150 and a low HDL.  She has done well on metformin .  Encouraged more consistent exercise with 30+ minutes of walking daily, eventually adding in resistance training 2 days a week     She was informed of the importance of frequent follow up visits to maximize her success with intensive lifestyle modifications for her multiple health conditions.   ATTESTASTION STATEMENTS:  Reviewed by clinician on day of visit: allergies, medications, problem list, medical history, surgical history, family history, social history, and previous encounter notes pertinent to obesity diagnosis.   I have personally spent 30 minutes total time today in  preparation, patient care, nutritional counseling and education,  and documentation for this visit, including the following: review of most recent clinical lab tests, prescribing medications/ refilling medications, reviewing medical assistant documentation, review and interpretation of bioimpedence results.     Micky Albee, D.O. DABFM, DABOM [facility] [address] [place], [place] [address] [phone]

## 2024-03-27 NOTE — Patient Instructions (Signed)
 Ramp Zepbound  to 5 mg weekly  Bring lunch to work  Designer, industrial/product to work  Take time to walk at work and take [employer] break

## 2024-04-04 ENCOUNTER — Other Ambulatory Visit: Payer: Self-pay | Admitting: Family Medicine

## 2024-04-04 DIAGNOSIS — R1013 Epigastric pain: Secondary | ICD-10-CM

## 2024-04-07 ENCOUNTER — Encounter: Payer: Self-pay | Admitting: Family Medicine

## 2024-04-07 ENCOUNTER — Ambulatory Visit: Admitting: Family Medicine

## 2024-04-07 VITALS — BP 118/70 | HR 62 | Temp 98.3°F | Resp 12 | Ht 67.0 in | Wt 263.0 lb

## 2024-04-07 DIAGNOSIS — M545 Low back pain, unspecified: Secondary | ICD-10-CM

## 2024-04-07 DIAGNOSIS — G8929 Other chronic pain: Secondary | ICD-10-CM | POA: Diagnosis not present

## 2024-04-07 DIAGNOSIS — M79652 Pain in left thigh: Secondary | ICD-10-CM

## 2024-04-07 MED ORDER — MELOXICAM 15 MG PO TABS: 15.0000 mg | ORAL_TABLET | Freq: Every day | ORAL | 1 refills | Status: AC | PRN

## 2024-04-07 MED ORDER — METHOCARBAMOL 500 MG PO TABS: 500.0000 mg | ORAL_TABLET | Freq: Three times a day (TID) | ORAL | 0 refills | Status: AC | PRN

## 2024-04-07 NOTE — Assessment & Plan Note (Signed)
 Problem has been going on for a few years, has not had imaging before, so will obtain lumbar X ray today. We discussed treatment options, she agrees with PT evaluation. She is on duloxetine  30 mg daily, prescribed by her neurologist, this could be increased to 60 mg if needed to also treat back pain. Meloxicam 50 mg daily as needed and methocarbamol  500 mg 3 times daily as needed recommended, we discussed side effects of both medications. Follow-up in 2 months, before if needed.

## 2024-04-07 NOTE — Progress Notes (Signed)
 ACUTE VISIT Chief Complaint  Patient presents with   Back Pain    Lower back pain, tender and pain when standing and sitting; ongoing for a while, became more irritated this past weekend    HPI: [name] is a 33 y.o. female, who is here today complaining of intermittent left lower back pain, as described above.  Additionally, she reports a puling sensation in her left hip, stating it "feels like it needs to pop." Her pain radiates down her left leg on the posterior side.  The intensity of her pain fluctuates from dull to sharp pain.  Back Pain This is a chronic problem. The current episode started more than 1 year ago. The problem has been waxing and waning since onset. The pain is present in the lumbar spine. The pain is moderate. The pain is The same all the time. The symptoms are aggravated by sitting, standing and bending. Associated symptoms include leg pain. Pertinent negatives include no abdominal pain, bladder incontinence, bowel incontinence, chest pain, dysuria, fever, headaches, numbness, paresthesias, pelvic pain, perianal numbness or weakness. She has tried NSAIDs and heat for the symptoms. The treatment provided mild relief.   When experiencing sharp pain, her pain is rated a 7/10.  Pt tends to manage her pain by taking Ibuprofen  or Aleve. In the last week she has also used a heating pad.   She has also been prescribed Flexeril  10 mg once daily and currently on Cymbalta  30 mg once daily, the latter one started in 01/2024, by her neurologist.  Lab Results  Component Value Date   NA 143 01/15/2024   CL 105 01/15/2024   K 4.7 01/15/2024   CO2 25 01/15/2024   BUN 10 01/15/2024   CREATININE 0.89 01/15/2024   EGFR 89 01/15/2024   CALCIUM 9.4 01/15/2024   ALBUMIN 4.3 01/15/2024   GLUCOSE 97 01/15/2024   Lab Results  Component Value Date   WBC 6.8 01/15/2024   HGB 14.7 01/15/2024   HCT 45.7 01/15/2024   MCV 88 01/15/2024   PLT 322 01/15/2024   She reports  she has not seen a chiropractor for this issue in the past.  Of note, she states she was told as a child she has a 10 degree curve in her spine.  Her LMP was at the end of March/2025.  She denies any chance of pregnancy, stating her period has been irregular since recently being inconsistent with taking her birth control.   Review of Systems  Constitutional:  Negative for activity change, appetite change and fever.  HENT:  Negative for mouth sores and sore throat.   Cardiovascular:  Negative for chest pain.  Gastrointestinal:  Negative for abdominal pain, bowel incontinence, nausea and vomiting.  Genitourinary:  Negative for bladder incontinence, decreased urine volume, dysuria and pelvic pain.  Musculoskeletal:  Positive for back pain.  Skin:  Negative for rash.  Neurological:  Negative for weakness, numbness, headaches and paresthesias.  See other pertinent positives and negatives in HPI.  Current Outpatient Medications on File Prior to Visit  Medication Sig Dispense Refill   DULoxetine  (CYMBALTA ) 30 MG capsule Take 1 capsule (30 mg total) by mouth daily. 30 capsule 5   levothyroxine  (SYNTHROID ) 150 MCG tablet Take 150 mcg by mouth daily in the afternoon. 2 tablets Monday- Saturday and 1 tablet on Sunday     metFORMIN  (GLUCOPHAGE -XR) 500 MG 24 hr tablet Take 1 tablet (500 mg total) by mouth daily with breakfast. (Patient taking differently: Take 1,000  mg by mouth daily with breakfast.) 30 tablet 0   norethindrone-ethinyl estradiol -FE (LOESTRIN FE) 1-20 MG-MCG tablet Take 1 tablet by mouth daily.     ondansetron  (ZOFRAN -ODT) 8 MG disintegrating tablet Take 1 tablet (8 mg total) by mouth every 8 (eight) hours as needed. 20 tablet 1   pantoprazole  (PROTONIX ) 40 MG tablet TAKE 1 TABLET BY MOUTH EVERY DAY 30 tablet 3   tirzepatide  (ZEPBOUND ) 5 MG/0.5ML Pen Inject 5 mg into the skin once a week. 6 mL 0   Vitamin D , Ergocalciferol , (DRISDOL ) 1.25 MG (50000 UNIT) CAPS capsule Take 1 capsule  (50,000 Units total) by mouth every 7 (seven) days. 5 capsule 0   metoprolol  tartrate (LOPRESSOR ) 25 MG tablet Take 0.5 tablets (12.5 mg total) by mouth 2 (two) times daily as needed (for palpitations). 30 tablet 4   No current facility-administered medications on file prior to visit.    Past Medical History:  Diagnosis Date   ACL tear 05/12/2014   injured on trampoline   Anemia    Anxiety    Depression    Hashimoto's disease 2018   Headache(784.0)    stress   Heartburn    Hypothyroid    Palpitations    Polycystic ovarian syndrome    no current med.   Thyroid  disease    No Active Allergies  Social History   Socioeconomic History   Marital status: Married    Spouse name: Not on file   Number of children: 0   Years of education: College/RN   Highest education level: GED or equivalent  Occupational History    Employer:     Comment: ED-RN  Tobacco Use   Smoking status: Never   Smokeless tobacco: Never  Vaping Use   Vaping status: Never Used  Substance and Sexual Activity   Alcohol use: Yes    Comment: rare   Drug use: No   Sexual activity: Yes  Other Topics Concern   Not on file  Social History Narrative   Patient lives at home with family.   Caffeine  Use:1-2 cups   Right handed   Social Drivers of Health   Financial Resource Strain: Medium Risk (04/06/2024)   Overall Financial Resource Strain (CARDIA)    Difficulty of Paying Living Expenses: Somewhat hard  Food Insecurity: No Food Insecurity (04/06/2024)   Hunger Vital Sign    Worried About Running Out of Food in the Last Year: Never true    Ran Out of Food in the Last Year: Never true  Transportation Needs: No Transportation Needs (04/06/2024)   PRAPARE - Administrator, Civil Service (Medical): No    Lack of Transportation (Non-Medical): No  Physical Activity: Insufficiently Active (04/06/2024)   Exercise Vital Sign    Days of Exercise per Week: 3 days    Minutes of Exercise per  Session: 10 min  Stress: No Stress Concern Present (04/06/2024)   [name] of Occupational Health - Occupational Stress Questionnaire    Feeling of Stress : Only a little  Social Connections: Socially Integrated (04/06/2024)   Social Connection and Isolation Panel [NHANES]    Frequency of Communication with Friends and Family: More than three times a week    Frequency of Social Gatherings with Friends and Family: Twice a week    Attends Religious Services: More than 4 times per year    Active Member of [employer] or Organizations: Yes    Attends Banker Meetings: More than 4 times per year  Marital Status: Married    Vitals:   04/07/24 0703  BP: 118/70  Pulse: 62  Resp: 12  Temp: 98.3 F (36.8 C)  SpO2: 99%   Body mass index is 41.19 kg/m.  Physical Exam Vitals and nursing note reviewed.  Constitutional:      General: She is not in acute distress.    Appearance: She is well-developed. She is not ill-appearing.  HENT:     Head: Normocephalic and atraumatic.  Eyes:     Conjunctiva/sclera: Conjunctivae normal.  Cardiovascular:     Rate and Rhythm: Normal rate and regular rhythm.     Pulses:          Dorsalis pedis pulses are 2+ on the left side.       Posterior tibial pulses are 2+ on the left side.     Heart sounds: No murmur heard. Pulmonary:     Effort: Pulmonary effort is normal. No respiratory distress.     Breath sounds: Normal breath sounds.  Abdominal:     Palpations: Abdomen is soft. There is no mass.     Tenderness: There is no abdominal tenderness.  Musculoskeletal:     Lumbar back: Tenderness present. No bony tenderness. Negative right straight leg raise test and negative left straight leg raise test.       Back:     Left hip: Tenderness (with full flexion) present. No bony tenderness. Normal range of motion.     Right lower leg: No edema.     Left lower leg: No edema.     Comments: No significant deformity appreciated. Pain is not  elicited with movement on exam table during examination. No local edema or erythema appreciated, no suspicious lesions.  Skin:    General: Skin is warm.     Findings: No erythema or rash.  Neurological:     General: No focal deficit present.     Mental Status: She is alert and oriented to person, place, and time.     Gait: Gait normal.     Deep Tendon Reflexes:     Reflex Scores:      Patellar reflexes are 2+ on the right side and 2+ on the left side. Psychiatric:        Mood and Affect: Mood and affect normal.   ASSESSMENT AND PLAN: [name] was seen today for lower back pain.   Chronic left-sided low back pain, unspecified whether sciatica present Assessment & Plan: Problem has been going on for a few years, has not had imaging before, so will obtain lumbar X ray today. We discussed treatment options, she agrees with PT evaluation. She is on duloxetine  30 mg daily, prescribed by her neurologist, this could be increased to 60 mg if needed to also treat back pain. Meloxicam 50 mg daily as needed and methocarbamol  500 mg 3 times daily as needed recommended, we discussed side effects of both medications. Follow-up in 2 months, before if needed.  Orders: -     DG Lumbar Spine Complete; Future -     Methocarbamol ; Take 1 tablet (500 mg total) by mouth every 8 (eight) hours as needed for muscle spasms.  Dispense: 45 tablet; Refill: 0 -     Meloxicam; Take 1 tablet (15 mg total) by mouth daily as needed for pain.  Dispense: 30 tablet; Refill: 1 -     Ambulatory referral to Physical Therapy  Pain of left thigh Also present for a few years, posterior aspect of thigh. ?  Radicular pain and hamstring tightness to be considered. PT referral plced. Monitor for new symptoms.  -     Methocarbamol ; Take 1 tablet (500 mg total) by mouth every 8 (eight) hours as needed for muscle spasms.  Dispense: 45 tablet; Refill: 0 -     Meloxicam; Take 1 tablet (15 mg total) by mouth daily as  needed for pain.  Dispense: 30 tablet; Refill: 1 -     Ambulatory referral to Physical Therapy  LMP in 01/2024, states that missed OCP's, which caused irregular menses, denies possibility of pregnancy and understands she is having X ray done.  Return in about 2 months (around 06/07/2024) for Back pain.  I, Bernita Bristle, acting as a scribe for Belen Pesch [place], MD., have documented all relevant documentation on the behalf of Whitney Kuzel [place], MD, as directed by   while in the presence of Valeriano Bain [place], MD.  I, Mckinna Demars [place], MD, have reviewed all documentation for this visit. The documentation on 04/07/24 for the exam, diagnosis, procedures, and orders are all accurate and complete.  Whitney Lill G. [place], MD  [facility]. [place] office.

## 2024-04-07 NOTE — Patient Instructions (Addendum)
 A few things to remember from today's visit:  Chronic left-sided low back pain, unspecified whether sciatica present - Plan: DG Lumbar Spine Complete, methocarbamol  (ROBAXIN ) 500 MG tablet, meloxicam (MOBIC) 15 MG tablet, Ambulatory referral to Physical Therapy  Pain of left thigh - Plan: methocarbamol  (ROBAXIN ) 500 MG tablet, meloxicam (MOBIC) 15 MG tablet, Ambulatory referral to Physical Therapy  Muscle relaxant causes drowsiness, so no driving when taking it. Meloxicam as needed, no over the counter Ibuprofen  or Naproxen. Can also add Tylenol   If you need refills for medications you take chronically, please call your pharmacy. Do not use My Chart to request refills or for acute issues that need immediate attention. If you send a my chart message, it may take a few days to be addressed, specially if I am not in the office.  Please be sure medication list is accurate. If a new problem present, please set up appointment sooner than planned today.

## 2024-04-11 ENCOUNTER — Ambulatory Visit (INDEPENDENT_AMBULATORY_CARE_PROVIDER_SITE_OTHER)

## 2024-04-11 ENCOUNTER — Other Ambulatory Visit: Payer: Self-pay

## 2024-04-11 ENCOUNTER — Other Ambulatory Visit

## 2024-04-11 ENCOUNTER — Encounter: Payer: Self-pay | Admitting: Rehabilitative and Restorative Service Providers"

## 2024-04-11 ENCOUNTER — Ambulatory Visit: Attending: Family Medicine | Admitting: Rehabilitative and Restorative Service Providers"

## 2024-04-11 DIAGNOSIS — R252 Cramp and spasm: Secondary | ICD-10-CM | POA: Insufficient documentation

## 2024-04-11 DIAGNOSIS — M419 Scoliosis, unspecified: Secondary | ICD-10-CM | POA: Diagnosis not present

## 2024-04-11 DIAGNOSIS — M47816 Spondylosis without myelopathy or radiculopathy, lumbar region: Secondary | ICD-10-CM | POA: Diagnosis not present

## 2024-04-11 DIAGNOSIS — G8929 Other chronic pain: Secondary | ICD-10-CM | POA: Insufficient documentation

## 2024-04-11 DIAGNOSIS — M6281 Muscle weakness (generalized): Secondary | ICD-10-CM | POA: Diagnosis not present

## 2024-04-11 DIAGNOSIS — M79652 Pain in left thigh: Secondary | ICD-10-CM | POA: Diagnosis not present

## 2024-04-11 DIAGNOSIS — M5459 Other low back pain: Secondary | ICD-10-CM | POA: Insufficient documentation

## 2024-04-11 DIAGNOSIS — M545 Low back pain, unspecified: Secondary | ICD-10-CM

## 2024-04-11 NOTE — Therapy (Signed)
 OUTPATIENT PHYSICAL THERAPY THORACOLUMBAR EVALUATION   Patient Name: Whitney Aguirre MRN: [id number] DOB:[birth year], 33 y.o., female Today's Date: 04/11/2024  END OF SESSION:  PT End of Session - 04/11/24 0854     Visit Number 1    Date for PT Re-Evaluation 06/06/24    Authorization Type BC/BS    PT Start Time 0850    PT Stop Time 0925    PT Time Calculation (min) 35 min    Activity Tolerance Patient tolerated treatment well    Behavior During Therapy [facility] for tasks assessed/performed             Past Medical History:  Diagnosis Date   ACL tear 05/12/2014   injured on trampoline   Anemia    Anxiety    Depression    Hashimoto's disease 2018   Headache(784.0)    stress   Heartburn    Hypothyroid    Palpitations    Polycystic ovarian syndrome    no current med.   Thyroid  disease    Past Surgical History:  Procedure Laterality Date   KNEE ARTHROSCOPY WITH ANTERIOR CRUCIATE LIGAMENT (ACL) REPAIR Left 06/05/2014   Procedure: LEFT KNEE ARTHROSCOPY WITH ANTERIOR CRUCIATE LIGAMENT (ACL) REPAIR;  Surgeon: Boston Byers, MD;  Location: [facility];  Service: Orthopedics;  Laterality: Left;   WISDOM TOOTH EXTRACTION     Patient Active Problem List   Diagnosis Date Noted   Chronic left-sided low back pain 04/07/2024   Metabolic syndrome 09/27/2023   Vitamin D  deficiency 09/27/2023   Insulin  resistance 09/27/2023   Other fatigue 09/04/2023   SOBOE (shortness of breath on exertion) 09/04/2023   Other specified hypothyroidism 09/04/2023   Anxiety and depression 09/04/2023   Polyphagia 09/04/2023   Depression screen 09/04/2023   BMI 40.0-44.9, adult (HCC) 09/04/2023   Migraine without status migrainosus, not intractable 08/20/2023   PCOS (polycystic ovarian syndrome) 08/20/2023   Obesity, Class II, BMI 35-39.9 08/20/2023   Family history of cardiomyopathy 05/28/2022   Depression, major, single episode, moderate (HCC) 01/26/2021   Anxiety disorder,  unspecified 01/26/2021   Chronic nausea 06/28/2020   Headache, unspecified headache type 01/28/2019   Morbid obesity (HCC) 09/04/2018   GERD (gastroesophageal reflux disease) 04/02/2018   Acquired hypothyroidism 10/04/2016   Insomnia disorder 09/07/2016   Allergic rhinitis 08/29/2016   Chest pain 01/04/2012   Palpitations 01/04/2012    PCP: [place], Betty G, MD  REFERRING PROVIDER: [place], Betty G, MD  REFERRING DIAG: M54.50,G89.29 (ICD-10-CM) - Chronic left-sided low back pain, unspecified whether sciatica present M79.652 (ICD-10-CM) - Pain of left thigh  Rationale for Evaluation and Treatment: Rehabilitation  THERAPY DIAG:  Other low back pain  Cramp and spasm  Muscle weakness (generalized)  ONSET DATE: more than a year intermittently  SUBJECTIVE:  SUBJECTIVE STATEMENT: Patient has had intermittent back pain for more than a year.  Patient states that in childhood, she had difficulty lying flat and went to [facility] and they told her there was a 10 degree curve in her spine.  She states that she has a sedentary job and she is uncertain if this is compounding her back pain.  States that it hurts to shower or stand for doing the dishes.  She reports that on Sunday, she spent nearly all day on her heating pad.  PERTINENT HISTORY:  Left ACL tear s/p repair 10 years ago, Diabetes  PAIN:  Are you having pain? Yes: NPRS scale: 0-7/10 Pain location: low back, generally left side Pain description: "it's almost like it's period cramping" Aggravating factors: standing Relieving factors: lying flat on a heating pad  PRECAUTIONS: None  RED FLAGS: None   WEIGHT BEARING RESTRICTIONS: No  FALLS:  Has patient fallen in last 6 months? 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  NEXT MD VISIT: As Needed  OBJECTIVE:  Note: Objective measures were completed at Evaluation unless otherwise noted.  DIAGNOSTIC FINDINGS:  Lumbar Radiographs ordered, pt has not had yet  PATIENT SURVEYS:  Modified Oswestry  8 / 50 = 16.0 %   COGNITION: Overall cognitive status: Within functional limits for tasks assessed     SENSATION: Patient denies numbness and tingling  MUSCLE LENGTH: Hamstrings: Tightness bilat  POSTURE: rounded shoulders, forward head, and per patient has a 10 degree curvature  PALPATION: Tightness/spasms noted on left lumbar spine  LUMBAR ROM:   Eval: Limited secondary to increased pain, especially with left rotation  LOWER EXTREMITY ROM:     WFL  LOWER EXTREMITY MMT:    Eval: Right LE is WFL Left hip strength is grossly 4/5 throughout  LUMBAR SPECIAL TESTS:  Slump test: positive on the left side  FUNCTIONAL TESTS:  Eval: 5 times sit to stand: 12.57 sec Single Leg Stance:  right- >30 sec, left- >30 sec, but with increased sway noted  GAIT: Distance walked: > 1 mile Assistive device utilized: None Level of assistance: Complete Independence Comments: Patient states that she was able to walk at the park, and had some increased pain, but did not have to stop.  TODAY'S TREATMENT DATE: 04/11/2024 Seated hamstring stretch x20 sec bilat    Seated piriformis stretch x20 sec bilat      Seated cat/cow x10 Issued HEP and reviewed exercises  PATIENT EDUCATION:  Education  details: Issued HEP Person educated: Patient Education method: Explanation, Facilities manager, and Handouts Education comprehension: verbalized understanding and returned demonstration  HOME EXERCISE PROGRAM: Access Code: [email or web address] URL: [email or web address] Date: 04/11/2024 Prepared by: Chaneta Comer Kourtni Stineman  Exercises - Seated Scapular Retraction  - 1 x daily - 7 x weekly - 2 sets - 10 reps - Seated Hamstring Stretch  - 1 x daily - 7 x weekly - 2 reps - 20 sec hold - Seated Piriformis Stretch with Trunk Bend  - 1 x daily - 7 x weekly - 2 reps - 20 sec hold - Seated Cat Cow  - 1 x daily - 7 x weekly - 2 sets - 10 reps - Seated Transversus Abdominis Bracing  - 1 x daily - 7 x weekly - 2 sets - 10 reps - Hooklying Active Hamstring Stretch  - 1 x daily - 7 x weekly - 10 reps - Supine Lower Trunk Rotation  - 1 x daily - 7 x weekly - 10 reps - Supine Posterior Pelvic Tilt  - 1 x daily - 7 x weekly - 2 sets - 10 reps - Sidelying Thoracic Rotation with Open Book  - 1 x daily - 7 x weekly - 10 reps - Clamshell  - 1 x daily - 7 x weekly - 2 sets - 10 reps  ASSESSMENT:  CLINICAL IMPRESSION: Patient is a 33 y.o. female who was seen today for physical therapy evaluation and treatment for low back pain and pain in the left thigh.  Patient's PLOF is independent.  She states that she was told as a child that she has a 10 degree curvature and she is scheduled to get her lumbar radiograph.   OBJECTIVE IMPAIRMENTS: difficulty walking, decreased strength, increased muscle spasms, impaired flexibility, postural dysfunction, and pain.   ACTIVITY LIMITATIONS: lifting, bending, standing, and stairs  PARTICIPATION LIMITATIONS: meal prep, cleaning, laundry, community activity, and occupation  PERSONAL FACTORS: Time since onset of injury/illness/exacerbation and 1-2 comorbidities: Hx of left ACL repair, Diabetes are also affecting patient's functional outcome.   REHAB POTENTIAL: Good  CLINICAL DECISION  MAKING: Stable/uncomplicated  EVALUATION COMPLEXITY: Low   GOALS: Goals reviewed with patient? Yes  SHORT TERM GOALS: Target date: 04/25/2024  Patient will be independent with initial HEP. Baseline: Goal status: INITIAL  2.  Patient will report at least a 30% improvement in symptoms since starting PT. Baseline:  Goal status: INITIAL   LONG TERM GOALS: Target date: 06/06/2024  Patient will be independent with advanced HEP to allow for self progression after discharge. Baseline:  Goal status: INITIAL  2.  Patient will report ability to take a shower and cook without increased pain. Baseline:  Goal status: INITIAL  3.  Patient will improve modified Oswestry to no greater than 5% to demonstrate improvement in functional tasks. Baseline: 16% Goal status: INITIAL  4.  Patient will report ability to return to her desired tasks without increased pain. Baseline:  Goal status: INITIAL  5.  Patient will increase left hip strength to [facility] to allow her to navigate stairs with reciprocal pattern with improved ease. Baseline:  Goal status: INITIAL    PLAN:  PT FREQUENCY: 1-2x/week  PT DURATION: 8 weeks  PLANNED INTERVENTIONS: 97164- PT Re-evaluation, 97110-Therapeutic exercises, 97530- Therapeutic activity, [id number]- Neuromuscular re-education, 97535- Self Care, [id number]- Manual therapy, [id number]- Gait training, [phone]- Canalith repositioning, [id number]- Aquatic Therapy, [id number]- Electrical stimulation (unattended), [id number]- Electrical stimulation (manual), [id number]- Vasopneumatic device, [id number]- Ultrasound, [id number]- Traction (mechanical), [id number]- Ionotophoresis 4mg /ml  Dexamethasone , Patient/Family education, Balance training, Stair training, Taping, Dry Needling, Joint mobilization, Joint manipulation, Spinal manipulation, Spinal mobilization, Vestibular training, Cryotherapy, and Moist heat.  PLAN FOR NEXT SESSION: Assess and progress HEP as indicated, strengthening, flexibility, manual/dry needling as  indicated     Robyne Christen, PT, DPT 04/11/24, 8:55 AM  [facility] [address] [place], [place] [address] Phone # [phone] Fax [phone]

## 2024-04-12 ENCOUNTER — Ambulatory Visit: Payer: Self-pay | Admitting: Family Medicine

## 2024-05-01 ENCOUNTER — Ambulatory Visit: Admitting: Family Medicine

## 2024-05-01 ENCOUNTER — Encounter: Payer: Self-pay | Admitting: Family Medicine

## 2024-05-01 VITALS — BP 125/77 | HR 60 | Temp 97.6°F | Ht 67.0 in | Wt 250.0 lb

## 2024-05-01 DIAGNOSIS — E88819 Insulin resistance, unspecified: Secondary | ICD-10-CM | POA: Diagnosis not present

## 2024-05-01 DIAGNOSIS — R5383 Other fatigue: Secondary | ICD-10-CM

## 2024-05-01 DIAGNOSIS — E559 Vitamin D deficiency, unspecified: Secondary | ICD-10-CM

## 2024-05-01 DIAGNOSIS — Z6841 Body Mass Index (BMI) 40.0 and over, adult: Secondary | ICD-10-CM

## 2024-05-01 DIAGNOSIS — R1013 Epigastric pain: Secondary | ICD-10-CM

## 2024-05-01 DIAGNOSIS — Z6839 Body mass index (BMI) 39.0-39.9, adult: Secondary | ICD-10-CM

## 2024-05-01 DIAGNOSIS — E66812 Obesity, class 2: Secondary | ICD-10-CM

## 2024-05-01 MED ORDER — VITAMIN D (ERGOCALCIFEROL) 1.25 MG (50000 UNIT) PO CAPS: 50000.0000 [IU] | ORAL_CAPSULE | ORAL | 0 refills | Status: DC

## 2024-05-01 MED ORDER — ZEPBOUND 5 MG/0.5ML ~~LOC~~ SOAJ: 5.0000 mg | SUBCUTANEOUS | 0 refills | Status: DC

## 2024-05-01 MED ORDER — FAMOTIDINE 40 MG PO TABS: 40.0000 mg | ORAL_TABLET | Freq: Every day | ORAL | 1 refills | Status: DC

## 2024-05-01 NOTE — Progress Notes (Signed)
 Office: [phone]  /  Fax: [phone]  WEIGHT SUMMARY AND BIOMETRICS  Starting Date: 09/04/23  Starting Weight: 256lb   Weight Lost Since Last Visit: 10lb   Vitals Temp: 97.6 F (36.4 C) BP: 125/77 Pulse Rate: 60 SpO2: 100 %   Body Composition  Body Fat %: 46.5 % Fat Mass (lbs): 116.2 lbs Muscle Mass (lbs): 127 lbs Total Body Water (lbs): 99.6 lbs Visceral Fat Rating : 11     HPI  Chief Complaint: OBESITY  Whitney Aguirre is here to discuss her progress with her obesity treatment plan. She is on the the Category 3 Plan and states she is following her eating plan approximately 0 % of the time. She states she is exercising 30 minutes 2 times per week.   Interval History:  Since last office visit she is down 10 lb She has been doing pilates from home 2 days a week This gives her a net weight loss of 6 lb in 7 mos She is enjoying the added satiety from Zepbound  5 mg weekly injection She has not been walking consistently but is trying to get in more steps She is eating smaller portion sizes and snacking less She is hydrating well with water She has had some postprandial abdominal pain and nausea with dyspepsia at night that keeps her up She has not been taking Protonix  daily  Pharmacotherapy: Zepbound  5 mg weekly  PHYSICAL EXAM:  Blood pressure 125/77, pulse 60, temperature 97.6 F (36.4 C), height 5\' 7"  (1.702 m), weight 250 lb (113.4 kg), last menstrual period 02/25/2024, SpO2 100%. Body mass index is 39.16 kg/m.  General: She is overweight, cooperative, alert, well developed, and in no acute distress. PSYCH: Has normal mood, affect and thought process.   Lungs: Normal breathing effort, no conversational dyspnea.   ASSESSMENT AND PLAN  TREATMENT PLAN FOR OBESITY:  Recommended Dietary Goals  Aeon is currently in the action stage of change. As such, her goal is to continue weight management plan. She has agreed to the Category 3 Plan and keeping a food  journal and adhering to recommended goals of 1600 calories and 100+ g of  protein.  Behavioral Intervention  We discussed the following Behavioral Modification Strategies today: increasing lean protein intake to established goals, increasing fiber rich foods, increasing water intake , keeping healthy foods at home, identifying sources and decreasing liquid calories, decreasing eating out or consumption of processed foods, and making healthy choices when eating convenient foods, practice mindfulness eating and understand the difference between hunger signals and cravings, work on managing stress, creating time for self-care and relaxation, avoiding temptations and identifying enticing environmental cues, planning for success, and continue to work on maintaining a reduced calorie state, getting the recommended amount of protein, incorporating whole foods, making healthy choices, staying well hydrated and practicing mindfulness when eating..  Additional resources provided today: NA  Recommended Physical Activity Goals  Lashya has been advised to work up to 150 minutes of moderate intensity aerobic activity a week and strengthening exercises 2-3 times per week for cardiovascular health, weight loss maintenance and preservation of muscle mass.   She has agreed to Start aerobic activity with a goal of 150 minutes a week at moderate intensity.   Pharmacotherapy changes for the treatment of obesity: none  ASSOCIATED CONDITIONS ADDRESSED TODAY  Epigastric pain New, she has c/o epigastric pain without vomiting or RUQ pain Denies constipation or anorexia Has not been taking Protonix  as prescribed Due to use of Zepbound , check labs today  to r/o pancreatitis Avoid overeating with Zepbound / high sugar/ high fat foods -     Comprehensive metabolic panel with GFR -     Amylase -     Lipase  Vitamin D  deficiency Last vitamin D  Lab Results  Component Value Date   VD25OH 15.1 (L) 01/15/2024  Repeat  vitamin D  level today, on RX vitamin D  weekly  -     Vitamin D  (Ergocalciferol ); Take 1 capsule (50,000 Units total) by mouth every 7 (seven) days.  Dispense: 5 capsule; Refill: 0  Class 3 severe obesity due to excess calories with body mass index (BMI) of 40.0 to 44.9 in adult, unspecified whether serious comorbidity present  Insulin  resistance Taking metformin  XR 1000 mg with breakfast per PCP Tolerating well Has room to ramp up walking time/ resistance training Continue to limit high sugar foods and drinks -     Insulin , random -     VITAMIN D  25 Hydroxy (Vit-D Deficiency, Fractures)  Other fatigue -     Vitamin B12 -     CBC  Dyspepsia Worsened with AM nausea likely due to Zepbound  use and poor compliance on Protonix  Reviewed lifestyle changes above Begin Famotidine 40 mg qha -     Famotidine; Take 1 tablet (40 mg total) by mouth at bedtime.  Dispense: 30 tablet; Refill: 1  Class 2 severe obesity due to excess calories with serious comorbidity and body mass index (BMI) of 39.0 to 39.9 in adult (HCC) -     Zepbound ; Inject 5 mg into the skin once a week.  Dispense: 6 mL; Refill: 0      She was informed of the importance of frequent follow up visits to maximize her success with intensive lifestyle modifications for her multiple health conditions.   ATTESTASTION STATEMENTS:  Reviewed by clinician on day of visit: allergies, medications, problem list, medical history, surgical history, family history, social history, and previous encounter notes pertinent to obesity diagnosis.   I have personally spent 33 minutes total time today in preparation, patient care, nutritional counseling and education,  and documentation for this visit, including the following: review of most recent clinical lab tests, prescribing medications/ refilling medications, reviewing medical assistant documentation, review and interpretation of bioimpedence results.     Micky Albee, D.O. DABFM, DABOM [facility] [address] [place], [place] [address] [phone]

## 2024-05-03 LAB — COMPREHENSIVE METABOLIC PANEL WITH GFR
ALT: 41 IU/L — ABNORMAL HIGH (ref 0–32)
AST: 21 IU/L (ref 0–40)
Albumin: 3.9 g/dL — ABNORMAL LOW (ref 4.0–5.0)
Alkaline Phosphatase: 76 IU/L (ref 44–121)
BUN/Creatinine Ratio: 18 (ref 9–23)
BUN: 10 mg/dL (ref 6–20)
Bilirubin Total: <0.2 mg/dL (ref 0.0–1.2)
CO2: 23 mmol/L (ref 20–29)
Calcium: 9.6 mg/dL (ref 8.7–10.2)
Chloride: 107 mmol/L — ABNORMAL HIGH (ref 96–106)
Creatinine, Ser: 0.57 mg/dL (ref 0.57–1.00)
Globulin, Total: 2.1 g/dL (ref 1.5–4.5)
Glucose: 94 mg/dL (ref 70–99)
Potassium: 4.8 mmol/L (ref 3.5–5.2)
Sodium: 144 mmol/L (ref 134–144)
Total Protein: 6 g/dL (ref 6.0–8.5)
eGFR: 125 mL/min/{1.73_m2} (ref >59)

## 2024-05-03 LAB — VITAMIN D 25 HYDROXY (VIT D DEFICIENCY, FRACTURES): Vit D, 25-Hydroxy: 23 ng/mL — ABNORMAL LOW (ref 30.0–100.0)

## 2024-05-03 LAB — VITAMIN B12: Vitamin B-12: 455 pg/mL (ref 232–1245)

## 2024-05-03 LAB — CBC
Hematocrit: 42.2 % (ref 34.0–46.6)
Hemoglobin: 13.6 g/dL (ref 11.1–15.9)
MCH: 28.2 pg (ref 26.6–33.0)
MCHC: 32.2 g/dL (ref 31.5–35.7)
MCV: 87 fL (ref 79–97)
Platelets: 279 10*3/uL (ref 150–450)
RBC: 4.83 x10E6/uL (ref 3.77–5.28)
RDW: 12.3 % (ref 11.7–15.4)
WBC: 6.1 10*3/uL (ref 3.4–10.8)

## 2024-05-03 LAB — INSULIN, RANDOM: INSULIN: 35.4 u[IU]/mL — ABNORMAL HIGH (ref 2.6–24.9)

## 2024-05-03 LAB — AMYLASE: Amylase: 66 U/L (ref 31–110)

## 2024-05-03 LAB — LIPASE: Lipase: 45 U/L (ref 14–72)

## 2024-05-05 ENCOUNTER — Ambulatory Visit: Payer: Self-pay | Admitting: Family Medicine

## 2024-05-08 DIAGNOSIS — M25562 Pain in left knee: Secondary | ICD-10-CM | POA: Diagnosis not present

## 2024-05-28 ENCOUNTER — Encounter: Payer: Self-pay | Admitting: Family Medicine

## 2024-05-28 ENCOUNTER — Ambulatory Visit: Admitting: Family Medicine

## 2024-05-28 VITALS — BP 124/86 | HR 64 | Temp 98.0°F | Ht 67.0 in | Wt 253.0 lb

## 2024-05-28 DIAGNOSIS — E559 Vitamin D deficiency, unspecified: Secondary | ICD-10-CM

## 2024-05-28 DIAGNOSIS — K760 Fatty (change of) liver, not elsewhere classified: Secondary | ICD-10-CM

## 2024-05-28 DIAGNOSIS — R1013 Epigastric pain: Secondary | ICD-10-CM

## 2024-05-28 DIAGNOSIS — E88819 Insulin resistance, unspecified: Secondary | ICD-10-CM | POA: Diagnosis not present

## 2024-05-28 DIAGNOSIS — Z6839 Body mass index (BMI) 39.0-39.9, adult: Secondary | ICD-10-CM

## 2024-05-28 DIAGNOSIS — E66812 Obesity, class 2: Secondary | ICD-10-CM

## 2024-05-28 MED ORDER — FAMOTIDINE 40 MG PO TABS: 40.0000 mg | ORAL_TABLET | Freq: Every day | ORAL | 1 refills | Status: DC

## 2024-05-28 MED ORDER — VITAMIN D (ERGOCALCIFEROL) 1.25 MG (50000 UNIT) PO CAPS: 50000.0000 [IU] | ORAL_CAPSULE | ORAL | 0 refills | Status: DC

## 2024-05-28 NOTE — Patient Instructions (Signed)
 Try to track your daily calories using My[employer]  Aim for 1700 calories per day This should include 100-120 g of protein daily Remember your fruits and veggies Avoid high sugar foods and drinks Increase water intake  Stay on Protonix  daily ADD Famotidine  40 mg at bedtime Referral to Dr Stacia made for epigastric pain  Stay off of Zepbound  Stay on RX vitamin D  weekly

## 2024-05-28 NOTE — Progress Notes (Signed)
 Office: [phone]  /  Fax: [phone]  WEIGHT SUMMARY AND BIOMETRICS  Starting Date: 09/04/23  Starting Weight: 256lb   Weight Lost Since Last Visit: 0lb   Vitals Temp: 98 F (36.7 C) BP: 124/86 Pulse Rate: 64 SpO2: 99 %   Body Composition  Body Fat %: 44 % Fat Mass (lbs): 111.4 lbs Muscle Mass (lbs): 134.8 lbs Total Body Water (lbs): 96.6 lbs Visceral Fat Rating : 10   HPI  Chief Complaint: OBESITY  Whitney Aguirre is here to discuss her progress with her obesity treatment plan. She is on the the Category 3 Plan and states she is following her eating plan approximately 0 % of the time. She states she is exercising 30 minutes 1-2 times per week.  Interval History:  Since last office visit she is up 3 lb Body fat is down 4.8 pounds since last visit She did hold her Zepbound  x 2 injections Without Zepbound  injections, she still had one episode of epigastric pain needing to take Tylenol  She has a net weight loss of 3 pounds in the past 8 months of medically supervised weight management She did just come back from a 1 week cruise She is no longer eating on category 3 meal plan for tracking her calories She is being mindful of food choices and portion sizes She has been doing some light exercise 1 or 2 days a week  Pharmacotherapy: None  PHYSICAL EXAM:  Blood pressure 124/86, pulse 64, temperature 98 F (36.7 C), height 5' 7 (1.702 m), weight 253 lb (114.8 kg), last menstrual period 05/05/2024, SpO2 99%. Body mass index is 39.63 kg/m.  General: She is overweight, cooperative, alert, well developed, and in no acute distress. PSYCH: Has normal mood, affect and thought process.   Lungs: Normal breathing effort, no conversational dyspnea.  ASSESSMENT AND PLAN  TREATMENT PLAN FOR OBESITY:  Recommended Dietary Goals  Whitney Aguirre is currently in the action stage of change. As such, her goal is to continue weight management plan. She has agreed to keeping a food journal  and adhering to recommended goals of 1700 calories and 100 to 120 g of protein and practicing portion control and making smarter food choices, such as increasing vegetables and decreasing simple carbohydrates.  Behavioral Intervention  We discussed the following Behavioral Modification Strategies today: increasing lean protein intake to established goals, increasing fiber rich foods, increasing water intake , work on meal planning and preparation, work on Counselling psychologist calories using tracking application, keeping healthy foods at home, continue to practice mindfulness when eating, planning for success, better snacking choices, and continue to work on maintaining a reduced calorie state, getting the recommended amount of protein, incorporating whole foods, making healthy choices, staying well hydrated and practicing mindfulness when eating..  Additional resources provided today: NA  Recommended Physical Activity Goals  Whitney Aguirre has been advised to work up to 150 minutes of moderate intensity aerobic activity a week and strengthening exercises 2-3 times per week for cardiovascular health, weight loss maintenance and preservation of muscle mass.   She has agreed to Start aerobic activity with a goal of 150 minutes a week at moderate intensity.   Pharmacotherapy changes for the treatment of obesity: Hold Zepbound   ASSOCIATED CONDITIONS ADDRESSED TODAY  Epigastric pain She has continued to have episodic epigastric pain even with a 32-week-old on Zepbound .  Reviewed labs from last visit with normal pancreatic enzymes.  Her CBC was also normal.  She denies constipation, melena or hematochezia.  She has rare  nausea.  She does have a history of GERD on pantoprazole  40 mg daily and has not been taking the prescribed famotidine .  Her most recent episode of epigastric pain was unrelated to her food choices and was without nausea or vomiting, relieved by taking Tylenol .  Will get her back in with Dr.  Stacia for GI who she saw in 2024 -     Ambulatory referral to Gastroenterology  Dyspepsia She has not been taking Zepbound  as prescribed.  Continue to hold.  Begin famotidine  40 mg at bedtime daily. -     Famotidine ; Take 1 tablet (40 mg total) by mouth at bedtime.  Dispense: 30 tablet; Refill: 1  Vitamin D  deficiency Last vitamin D  Lab Results  Component Value Date   VD25OH 23.0 (L) 05/01/2024  Reviewed lab from last visit with patient.  Her vitamin D  level is improving on vitamin D  50,000 IU once weekly.  Continue vitamin D  50,000 IU once weekly with a target goal over 50.  -     Vitamin D  (Ergocalciferol ); Take 1 capsule (50,000 Units total) by mouth every 7 (seven) days.  Dispense: 5 capsule; Refill: 0  Class 2 severe obesity due to excess calories with serious comorbidity and body mass index (BMI) of 39.0 to 39.9 in adult [facility])  Insulin  resistance Unchanged.  Her fasting insulin  from last visit was reviewed with patient today.  She was 35.4, up from 29.9.  Remains on metformin  XR 500 mg once daily with Dr. Lavon.  She is working on dietary change, weight loss and exercise with some room for improvements on consistency.    Work on increasing walking time with a goal of 30 minutes or more 5 days a week  Hepatic steatosis Reviewed CMP results from 6/5 with an ALT elevated at 41.  Reviewed findings from her 07/08/2020 right upper quadrant ultrasound revealing findings of hepatic steatosis.  This likely explains her mild elevation in ALT.  Continue active plan for weight loss.  Limit alcohol consumption.  Consider elastography with GI.   She was informed of the importance of frequent follow up visits to maximize her success with intensive lifestyle modifications for her multiple health conditions.   ATTESTASTION STATEMENTS:  Reviewed by clinician on day of visit: allergies, medications, problem list, medical history, surgical history, family history, social history, and previous  encounter notes pertinent to obesity diagnosis.   I have personally spent 34 minutes total time today in preparation, patient care, nutritional counseling and education,  and documentation for this visit, including the following: review of most recent clinical lab tests, prescribing medications/ refilling medications, reviewing medical assistant documentation, review and interpretation of bioimpedence results.     Whitney Aguirre, D.O. DABFM, DABOM [facility] [address] [place], [place] [address] [phone]

## 2024-06-19 ENCOUNTER — Telehealth: Payer: Self-pay | Admitting: Gastroenterology

## 2024-06-19 NOTE — Progress Notes (Unsigned)
 [name], PA-C [address] [place], [place]  [address] Phone: [phone]   Primary Care Physician: [place], Betty G, MD  Primary Gastroenterologist:  [name], PA-C / Glendia Holt, MD   Chief Complaint: Upper abdominal pain       HPI:   Whitney Aguirre is a 33 y.o. female, established patient Dr. Holt, presents for episodes of generalized upper abdominal pain which started in March 2025 after she started on Zepbound .  [facility]  She describes episodes of intense upper abdominal pain which comes and goes located in the RUQ, epigastrium, and LUQ above the umbilicus.  Pain can be intense, last several hours and then resolves.  Had a severe episode July 11 after she ate dinner of upper abdominal pain.  She has had chronic intermittent nausea for many years.  No vomiting episodes.  She admits to acid reflux.  Was taking Protonix  which did not control acid reflux.  She switched to famotidine  which works better.  Takes Zofran  as needed for nausea.  She is currently having a bowel movement daily.  Previously had loose stools on metformin .  After she started the Zepbound  her stools have been more formed.  She has not had any episodes of rectal bleeding.  Currently taking fiber supplement daily which helps.  Zepbound  was discontinued 05/28/2024.  History of obesity, hypothyroidism, and PCOS.  Patient saw Dr. Holt 01/2019 for to evaluate rectal bleeding.  No family history of colon cancer.  Occasional mild constipation.  Normal rectal exam.  Diagnosed with hemorrhoidal bleeding.  Patient declined colonoscopy.  Was started on Metamucil daily.  05/01/2024 labs: Normal CBC (WBC 6.1, Hgb 13.6).  Normal lipase 45.  Slightly elevated ALT liver transaminase.  All other LFTs normal.  Normal vitamin B12 and iron.  Low vitamin D .  No recent abdominal imaging.  No previous EGD.  No H. pylori test.  Last RUQ ultrasound 06/2020: Hepatic  steatosis with no liver lesions.  Focal fatty sparing in the liver.  Normal gallbladder with no gallstones.  CBD 2.7 mm.  Current Outpatient Medications  Medication Sig Dispense Refill   DULoxetine  (CYMBALTA ) 30 MG capsule Take 1 capsule (30 mg total) by mouth daily. 30 capsule 5   famotidine  (PEPCID ) 40 MG tablet Take 1 tablet (40 mg total) by mouth at bedtime. 30 tablet 1   levothyroxine  (SYNTHROID ) 150 MCG tablet Take 150 mcg by mouth daily in the afternoon. 2 tablets Monday- Saturday and 1 tablet on Sunday     meloxicam  (MOBIC ) 15 MG tablet Take 1 tablet (15 mg total) by mouth daily as needed for pain. 30 tablet 1   metFORMIN  (GLUCOPHAGE -XR) 500 MG 24 hr tablet Take 1 tablet (500 mg total) by mouth daily with breakfast. (Patient taking differently: Take 1,000 mg by mouth daily with breakfast.) 30 tablet 0   methocarbamol  (ROBAXIN ) 500 MG tablet Take 1 tablet (500 mg total) by mouth every 8 (eight) hours as needed for muscle spasms. 45 tablet 0   metoprolol  tartrate (LOPRESSOR ) 25 MG tablet Take 0.5 tablets (12.5 mg total) by mouth 2 (two) times daily as needed (for palpitations). 30 tablet 4   norethindrone-ethinyl estradiol -FE (LOESTRIN FE) 1-20 MG-MCG tablet Take 1 tablet by mouth daily.     ondansetron  (ZOFRAN -ODT) 8 MG disintegrating tablet Take 1 tablet (8 mg total) by mouth every 8 (eight) hours as needed. 20 tablet 1   Vitamin D , Ergocalciferol , (DRISDOL ) 1.25 MG (50000 UNIT)  CAPS capsule Take 1 capsule (50,000 Units total) by mouth every 7 (seven) days. 5 capsule 0   No current facility-administered medications for this visit.    Allergies as of 06/20/2024   (No Known Allergies)    Past Medical History:  Diagnosis Date   ACL tear 05/12/2014   injured on trampoline   Anemia    Anxiety    Depression    Hashimoto's disease 2018   Headache(784.0)    stress   Heartburn    Hypothyroid    Palpitations    Polycystic ovarian syndrome    no current med.   Thyroid  disease      Past Surgical History:  Procedure Laterality Date   KNEE ARTHROSCOPY WITH ANTERIOR CRUCIATE LIGAMENT (ACL) REPAIR Left 06/05/2014   Procedure: LEFT KNEE ARTHROSCOPY WITH ANTERIOR CRUCIATE LIGAMENT (ACL) REPAIR;  Surgeon: Norleen LITTIE Gavel, MD;  Location: [facility];  Service: Orthopedics;  Laterality: Left;   WISDOM TOOTH EXTRACTION      Review of Systems:    All systems reviewed and negative except where noted in HPI.    Physical Exam:  BP 120/70   Pulse (!) 52   Ht 5' 7 (1.702 m)   Wt 250 lb (113.4 kg)   LMP 05/05/2024 (Approximate)   BMI 39.16 kg/m  Patient's last menstrual period was 05/05/2024 (approximate).  General: Well-nourished, well-developed, obese, in no acute distress.  Lungs: Clear to auscultation bilaterally. Non-labored. Heart: Regular rate and rhythm, no murmurs rubs or gallops.  Abdomen: Bowel sounds are normal; Abdomen is Soft; No hepatosplenomegaly, masses or hernias;  No Abdominal Tenderness at all today.; No guarding or rebound tenderness. Neuro: Alert and oriented x 3.  Grossly intact.  Psych: Alert and cooperative, normal mood and affect.  Imaging Studies: No results found.  Labs: CBC    Component Value Date/Time   WBC 7.4 06/20/2024 1423   RBC 5.14 (H) 06/20/2024 1423   HGB 14.7 06/20/2024 1423   HGB 13.6 05/01/2024 0756   HCT 44.2 06/20/2024 1423   HCT 42.2 05/01/2024 0756   PLT 301.0 06/20/2024 1423   PLT 279 05/01/2024 0756   MCV 86.0 06/20/2024 1423   MCV 87 05/01/2024 0756   MCH 28.2 05/01/2024 0756   MCH 28.9 07/02/2020 1114   MCHC 33.2 06/20/2024 1423   RDW 13.9 06/20/2024 1423   RDW 12.3 05/01/2024 0756   LYMPHSABS 3.1 06/20/2024 1423   LYMPHSABS 2.6 09/04/2023 0842   MONOABS 0.6 06/20/2024 1423   EOSABS 0.2 06/20/2024 1423   EOSABS 0.2 09/04/2023 0842   BASOSABS 0.0 06/20/2024 1423   BASOSABS 0.0 09/04/2023 0842    CMP     Component Value Date/Time   NA 141 06/20/2024 1423   NA 144 05/01/2024 0756   K  4.3 06/20/2024 1423   CL 105 06/20/2024 1423   CO2 31 06/20/2024 1423   GLUCOSE 95 06/20/2024 1423   BUN 9 06/20/2024 1423   BUN 10 05/01/2024 0756   CREATININE 0.78 06/20/2024 1423   CREATININE 0.89 07/02/2020 1114   CALCIUM 9.7 06/20/2024 1423   PROT 7.5 06/20/2024 1423   PROT 6.0 05/01/2024 0756   ALBUMIN 4.6 06/20/2024 1423   ALBUMIN 3.9 (L) 05/01/2024 0756   AST 14 06/20/2024 1423   ALT 20 06/20/2024 1423   ALKPHOS 66 06/20/2024 1423   BILITOT 0.5 06/20/2024 1423   BILITOT <0.2 05/01/2024 0756   GFRNONAA >90 12/07/2014 1729   GFRAA >90 12/07/2014 1729  Assessment and Plan:   Whitney Aguirre is a 33 y.o. y/o female presents for:  1.  Upper abdominal pain: Intermittent episodes, suspect adverse side effect of Zepbound .  Currently asymptomatic.  Zepbound  was stopped 05/28/2024. 2.  Nausea and vomiting 3.  Hepatic steatosis  Could be adverse side effects of Zepbound .  Cholelithiasis, peptic ulcer, GERD, gastritis, H. pylori, pancreatitis are also in the differential.  Plan: - Labs:  CBC, CMP, lipase - Complete Abdominal ultrasound - Check H. Pylori stool test - Remain off Zepbound  until GI symptoms resolve. - Continue Famotidine  40 Mg once daily.  Increase to twice daily if needed. - Recommend Lifestyle Modifications to prevent Acid Reflux.  Rec. Avoid coffee, sodas, peppermint, garlic, onions, alcohol, citrus fruits, chocolate, tomatoes, fatty and spicey foods.  Avoid eating 2-3 hours before bedtime.   - Continue OTC fiber supplement daily.  Pending above test results and GI symptoms, EGD is the next step if symptoms persist.  [name], PA-C  Follow up in 4 weeks with TG.

## 2024-06-19 NOTE — Telephone Encounter (Signed)
 Pt has stopped the zepbound  and still had an attack of abd pain. Pt scheduled to see [name] PA 7/252@1 :30pm./ Pt aware of appt.

## 2024-06-19 NOTE — Telephone Encounter (Signed)
 PT is calling to speak to a nurse regarding her symptoms. She feels she is having a gallbladder attack. She has pain all over her stomach and it lasts for several hours. It began once she started Zepbound . Please advise.

## 2024-06-20 ENCOUNTER — Other Ambulatory Visit

## 2024-06-20 ENCOUNTER — Encounter: Payer: Self-pay | Admitting: Physician Assistant

## 2024-06-20 ENCOUNTER — Ambulatory Visit: Admitting: Physician Assistant

## 2024-06-20 VITALS — BP 120/70 | HR 52 | Ht 67.0 in | Wt 250.0 lb

## 2024-06-20 DIAGNOSIS — R1010 Upper abdominal pain, unspecified: Secondary | ICD-10-CM | POA: Diagnosis not present

## 2024-06-20 DIAGNOSIS — R1084 Generalized abdominal pain: Secondary | ICD-10-CM | POA: Diagnosis not present

## 2024-06-20 DIAGNOSIS — K760 Fatty (change of) liver, not elsewhere classified: Secondary | ICD-10-CM

## 2024-06-20 DIAGNOSIS — R112 Nausea with vomiting, unspecified: Secondary | ICD-10-CM

## 2024-06-20 DIAGNOSIS — R1013 Epigastric pain: Secondary | ICD-10-CM

## 2024-06-20 LAB — CBC WITH DIFFERENTIAL/PLATELET
Basophils Absolute: 0 K/uL (ref 0.0–0.1)
Basophils Relative: 0.3 % (ref 0.0–3.0)
Eosinophils Absolute: 0.2 K/uL (ref 0.0–0.7)
Eosinophils Relative: 3.1 % (ref 0.0–5.0)
HCT: 44.2 % (ref 36.0–46.0)
Hemoglobin: 14.7 g/dL (ref 12.0–15.0)
Lymphocytes Relative: 41.5 % (ref 12.0–46.0)
Lymphs Abs: 3.1 K/uL (ref 0.7–4.0)
MCHC: 33.2 g/dL (ref 30.0–36.0)
MCV: 86 fl (ref 78.0–100.0)
Monocytes Absolute: 0.6 K/uL (ref 0.1–1.0)
Monocytes Relative: 8.6 % (ref 3.0–12.0)
Neutro Abs: 3.5 K/uL (ref 1.4–7.7)
Neutrophils Relative %: 46.5 % (ref 43.0–77.0)
Platelets: 301 K/uL (ref 150.0–400.0)
RBC: 5.14 Mil/uL — ABNORMAL HIGH (ref 3.87–5.11)
RDW: 13.9 % (ref 11.5–15.5)
WBC: 7.4 K/uL (ref 4.0–10.5)

## 2024-06-20 LAB — COMPREHENSIVE METABOLIC PANEL WITH GFR
ALT: 20 U/L (ref 0–35)
AST: 14 U/L (ref 0–37)
Albumin: 4.6 g/dL (ref 3.5–5.2)
Alkaline Phosphatase: 66 U/L (ref 39–117)
BUN: 9 mg/dL (ref 6–23)
CO2: 31 meq/L (ref 19–32)
Calcium: 9.7 mg/dL (ref 8.4–10.5)
Chloride: 105 meq/L (ref 96–112)
Creatinine, Ser: 0.78 mg/dL (ref 0.40–1.20)
GFR: 101.66 mL/min (ref >60.00)
Glucose, Bld: 95 mg/dL (ref 70–99)
Potassium: 4.3 meq/L (ref 3.5–5.1)
Sodium: 141 meq/L (ref 135–145)
Total Bilirubin: 0.5 mg/dL (ref 0.2–1.2)
Total Protein: 7.5 g/dL (ref 6.0–8.3)

## 2024-06-20 LAB — LIPASE: Lipase: 18 U/L (ref 11.0–59.0)

## 2024-06-20 NOTE — Patient Instructions (Addendum)
 Your provider has requested that you go to the basement level for lab work before leaving today. Press B on the elevator. The lab is located at the first door on the left as you exit the elevator.  Remain off Zepbound  for now.  You have been scheduled for an abdominal ultrasound at [facility] (1st floor of hospital) on 07/26/24 at 7:00am. Please arrive 30 minutes prior to your appointment for registration. Make certain not to have anything to eat or drink after midnight prior to your appointment. Should you need to reschedule your appointment, please contact radiology at [phone]. This test typically takes about 30 minutes to perform.  Please follow up sooner if symptoms increase or worsen  Due to recent changes in healthcare laws, you may see the results of your imaging and laboratory studies on MyChart before your provider has had a chance to review them.  We understand that in some cases there may be results that are confusing or concerning to you. Not all laboratory results come back in the same time frame and the provider may be waiting for multiple results in order to interpret others.  Please give us  48 hours in order for your provider to thoroughly review all the results before contacting the office for clarification of your results.   Thank you for trusting me with your gastrointestinal care!   [name], PA-C _______________________________________________________  If your blood pressure at your visit was 140/90 or greater, please contact your primary care physician to follow up on this.  _______________________________________________________  If you are age 31 or older, your body mass index should be between 23-30. Your Body mass index is 39.16 kg/m. If this is out of the aforementioned range listed, please consider follow up with your Primary Care Provider.  If you are age 12 or younger, your body mass index should be between 19-25. Your Body mass index is 39.16  kg/m. If this is out of the aformentioned range listed, please consider follow up with your Primary Care Provider.   ________________________________________________________  The [facility] providers would like to encourage you to use MYCHART to communicate with providers for non-urgent requests or questions.  Due to long hold times on the telephone, sending your provider a message by [facility] may be a faster and more efficient way to get a response.  Please allow 48 business hours for a response.  Please remember that this is for non-urgent requests.  _______________________________________________________

## 2024-06-23 ENCOUNTER — Ambulatory Visit: Payer: Self-pay | Admitting: Physician Assistant

## 2024-06-25 ENCOUNTER — Ambulatory Visit (HOSPITAL_BASED_OUTPATIENT_CLINIC_OR_DEPARTMENT_OTHER)
Admission: RE | Admit: 2024-06-25 | Discharge: 2024-06-25 | Disposition: A | Discharge status: Home / Self Care | Source: Ambulatory Visit | Attending: Physician Assistant | Admitting: Physician Assistant

## 2024-06-25 DIAGNOSIS — R112 Nausea with vomiting, unspecified: Secondary | ICD-10-CM | POA: Insufficient documentation

## 2024-06-25 DIAGNOSIS — K8020 Calculus of gallbladder without cholecystitis without obstruction: Secondary | ICD-10-CM | POA: Diagnosis not present

## 2024-06-25 DIAGNOSIS — R1013 Epigastric pain: Secondary | ICD-10-CM | POA: Diagnosis not present

## 2024-06-30 NOTE — Progress Notes (Signed)
 Notify patient complete abdominal ultrasound shows: 1.  Multiple gallstones filled the gallbladder lumen. 2.  No evidence of gallbladder infection. 3.  Normal common bile duct. 4.  Evidence of hepatic steatosis (fatty liver).  No liver lesions. 5.  Pancreas, kidneys, and aorta are normal. **I recommend refer to general surgeon to discuss cholecystectomy if patient has any more episodes of upper abdominal pain, nausea, or vomiting.  Also recommend low-fat diet, exercise, and weight loss. [name], PA-C

## 2024-07-03 ENCOUNTER — Encounter: Payer: Self-pay | Admitting: Family Medicine

## 2024-07-03 ENCOUNTER — Ambulatory Visit: Payer: Self-pay | Admitting: Surgery

## 2024-07-03 ENCOUNTER — Ambulatory Visit: Admitting: Family Medicine

## 2024-07-03 VITALS — BP 117/83 | HR 58 | Temp 98.0°F | Ht 67.0 in | Wt 249.0 lb

## 2024-07-03 DIAGNOSIS — E282 Polycystic ovarian syndrome: Secondary | ICD-10-CM

## 2024-07-03 DIAGNOSIS — E66812 Obesity, class 2: Secondary | ICD-10-CM

## 2024-07-03 DIAGNOSIS — Z6839 Body mass index (BMI) 39.0-39.9, adult: Secondary | ICD-10-CM

## 2024-07-03 DIAGNOSIS — K8020 Calculus of gallbladder without cholecystitis without obstruction: Secondary | ICD-10-CM | POA: Diagnosis not present

## 2024-07-03 DIAGNOSIS — K8050 Calculus of bile duct without cholangitis or cholecystitis without obstruction: Secondary | ICD-10-CM | POA: Diagnosis not present

## 2024-07-03 DIAGNOSIS — K760 Fatty (change of) liver, not elsewhere classified: Secondary | ICD-10-CM

## 2024-07-03 NOTE — H&P (View-Only) (Signed)
 Whitney Aguirre [id number]   Referring Provider:  [place], [place]   Subjective   Chief Complaint: New Consultation (cholelithiasis)     History of Present Illness:    Very pleasant 33 year old woman with history of anemia, anxiety/depression, PCOS, and obesity, no previous abdominal surgery, who presents for evaluation of biliary colic.  Beginning in about March of this year she has had intermittent epigastric postprandial pain associated with nausea and vomiting.  Episodes have been anywhere from half an hour to several hours in duration and some have been quite severe.  In the last month she has had 3 or 4 at least despite trying to avoid greasy foods which do seem to exacerbate her symptoms.  She did start Zepbound  sometime in March this year but stopped it in mid July with the aforementioned abdominal symptoms.  An ultrasound on 7/30 that shows gallstones filling the gallbladder lumen but no sonographic evidence of cholecystitis, common bile duct was 4.2 mm, did have increased hepatic parenchymal echogenicity suggestive of steatosis.  Work on 7/25 includes normal CMP and CBC.   Review of Systems: A complete review of systems was obtained from the patient.  I have reviewed this information and discussed as appropriate with the patient.  See HPI as well for other ROS.   Medical History: Past Medical History:  Diagnosis Date   Anemia    Anxiety    Depression    Diabetes mellitus without complication (CMS/HHS-HCC)    GERD (gastroesophageal reflux disease)    Hypertension    Polycystic ovarian syndrome    Thyroid  disease     There is no problem list on file for this patient.   Past Surgical History:  Procedure Laterality Date   Knee Surgery     Wisdom teeth extraction       No Known Allergies  Current Outpatient Medications on File Prior to Visit  Medication Sig Dispense Refill   DULoxetine  (CYMBALTA ) 30 MG DR capsule Take 30 mg by mouth once daily      ergocalciferol , vitamin D2, 1,250 mcg (50,000 unit) capsule Take 50,000 Units by mouth every 7 (seven) days     famotidine  (PEPCID ) 40 MG tablet Take 40 mg by mouth at bedtime     levothyroxine  (SYNTHROID ) 150 MCG tablet Take 150 mcg by mouth     metFORMIN  (GLUCOPHAGE -XR) 500 MG XR tablet Take 500 mg by mouth daily with dinner     metoprolol  TARTrate (LOPRESSOR ) 25 MG tablet Take 12.5 mg by mouth     norethindrone-ethinyl estradiol  (JUNEL FE 1/20) 1 mg-20 mcg (21)/75 mg (7) tablet Take 1 tablet by mouth once daily     ondansetron  (ZOFRAN -ODT) 8 MG disintegrating tablet Take 8 mg by mouth every 8 (eight) hours as needed     No current facility-administered medications on file prior to visit.    Family History  Problem Relation Age of Onset   Obesity Mother    High blood pressure (Hypertension) Mother    Diabetes Brother      Social History   Tobacco Use  Smoking Status Never  Smokeless Tobacco Never     Social History   Socioeconomic History   Marital status: Married  Tobacco Use   Smoking status: Never   Smokeless tobacco: Never  Vaping Use   Vaping status: Never Used  Substance and Sexual Activity   Alcohol use: Never   Drug use: Never   Social Drivers of Health   Financial Resource Strain: Medium Risk (04/06/2024)  Received from [facility]   Overall Financial Resource Strain (CARDIA)    Difficulty of Paying Living Expenses: Somewhat hard  Food Insecurity: No Food Insecurity (04/06/2024)   Received from [facility]   Hunger Vital Sign    Within the past 12 months, you worried that your food would run out before you got the money to buy more.: Never true    Within the past 12 months, the food you bought just didn't last and you didn't have money to get more.: Never true  Transportation Needs: No Transportation Needs (04/06/2024)   Received from [facility] - Transportation    Lack of Transportation (Medical): No    Lack of Transportation (Non-Medical): No   Physical Activity: Insufficiently Active (04/06/2024)   Received from [facility]   Exercise Vital Sign    On average, how many days per week do you engage in moderate to strenuous exercise (like a brisk walk)?: 3 days    On average, how many minutes do you engage in exercise at this level?: 10 min  Stress: No Stress Concern Present (04/06/2024)   Received from [facility] - Occupational Stress Questionnaire    Feeling of Stress : Only a little  Social Connections: Socially Integrated (04/06/2024)   Received from [facility]   Social Connection and Isolation Panel    In a typical week, how many times do you talk on the phone with family, friends, or neighbors?: More than three times a week    How often do you get together with friends or relatives?: Twice a week    How often do you attend church or religious services?: More than 4 times per year    Do you belong to any clubs or organizations such as church groups, unions, fraternal or athletic groups, or school groups?: Yes    How often do you attend meetings of the clubs or organizations you belong to?: More than 4 times per year    Are you married, widowed, divorced, separated, never married, or living with a partner?: Married    Objective:    Vitals:   07/03/24 1034 07/03/24 1035  BP: (!) 144/75   Pulse: 109   Temp: 36.8 C (98.3 F)   SpO2: 98%   Weight: (!) 115.2 kg (254 lb)   Height: 170.2 cm (5' 7)   PainSc:  0-No pain  PainLoc:  Abdomen    Body mass index is 39.78 kg/m.  Gen: A&Ox3, no distress  Chest: respiratory effort is normal. Abdomen: soft, nondistended, nontender.  Neuro: no gross deficit Psych: appropriate mood and affect, normal insight/judgment intact  Skin: warm and dry    Assessment and Plan:  Diagnoses and all orders for this visit:  Biliary colic    Crescendoing symptoms concerning for impending cholecystitis. I recommend proceeding with laparoscopic  cholecystectomy with possible cholangiogram.  Discussed the relevant anatomy using a diagram to demonstrate, and went over surgical technique.  Discussed risks of surgery including bleeding, infection, pain, scarring, intraabdominal injury specifically to the common bile duct and sequelae, subtotal cholecystectomy, bile leak, conversion to open surgery, failure to resolve symptoms, post-cholecystectomy diarrhea which is typically self-limited, blood clots/ pulmonary embolus, heart attack, pneumonia, stroke, etc. Questions were welcomed and answered to patient's satisfaction.  Patient wishes to proceed with surgery.     Mitzie Freund MD FACS

## 2024-07-03 NOTE — Patient Instructions (Signed)
 You are welcome to track daily calorie intake Aim for 1600-1700 cal/ day This should include 100 g of protein daily  Track daily steps Hydrate well with water/ sugar free drinks  Keep diet LOW FAT with gall stones! Nothing greasy, fatty, high in sugar, etc

## 2024-07-03 NOTE — H&P (Signed)
 Whitney Aguirre [id number]   Referring Provider:  [place], [place]   Subjective   Chief Complaint: New Consultation (cholelithiasis)     History of Present Illness:    Very pleasant 33 year old woman with history of anemia, anxiety/depression, PCOS, and obesity, no previous abdominal surgery, who presents for evaluation of biliary colic.  Beginning in about March of this year she has had intermittent epigastric postprandial pain associated with nausea and vomiting.  Episodes have been anywhere from half an hour to several hours in duration and some have been quite severe.  In the last month she has had 3 or 4 at least despite trying to avoid greasy foods which do seem to exacerbate her symptoms.  She did start Zepbound  sometime in March this year but stopped it in mid July with the aforementioned abdominal symptoms.  An ultrasound on 7/30 that shows gallstones filling the gallbladder lumen but no sonographic evidence of cholecystitis, common bile duct was 4.2 mm, did have increased hepatic parenchymal echogenicity suggestive of steatosis.  Work on 7/25 includes normal CMP and CBC.   Review of Systems: A complete review of systems was obtained from the patient.  I have reviewed this information and discussed as appropriate with the patient.  See HPI as well for other ROS.   Medical History: Past Medical History:  Diagnosis Date   Anemia    Anxiety    Depression    Diabetes mellitus without complication (CMS/HHS-HCC)    GERD (gastroesophageal reflux disease)    Hypertension    Polycystic ovarian syndrome    Thyroid  disease     There is no problem list on file for this patient.   Past Surgical History:  Procedure Laterality Date   Knee Surgery     Wisdom teeth extraction       No Known Allergies  Current Outpatient Medications on File Prior to Visit  Medication Sig Dispense Refill   DULoxetine  (CYMBALTA ) 30 MG DR capsule Take 30 mg by mouth once daily      ergocalciferol , vitamin D2, 1,250 mcg (50,000 unit) capsule Take 50,000 Units by mouth every 7 (seven) days     famotidine  (PEPCID ) 40 MG tablet Take 40 mg by mouth at bedtime     levothyroxine  (SYNTHROID ) 150 MCG tablet Take 150 mcg by mouth     metFORMIN  (GLUCOPHAGE -XR) 500 MG XR tablet Take 500 mg by mouth daily with dinner     metoprolol  TARTrate (LOPRESSOR ) 25 MG tablet Take 12.5 mg by mouth     norethindrone-ethinyl estradiol  (JUNEL FE 1/20) 1 mg-20 mcg (21)/75 mg (7) tablet Take 1 tablet by mouth once daily     ondansetron  (ZOFRAN -ODT) 8 MG disintegrating tablet Take 8 mg by mouth every 8 (eight) hours as needed     No current facility-administered medications on file prior to visit.    Family History  Problem Relation Age of Onset   Obesity Mother    High blood pressure (Hypertension) Mother    Diabetes Brother      Social History   Tobacco Use  Smoking Status Never  Smokeless Tobacco Never     Social History   Socioeconomic History   Marital status: Married  Tobacco Use   Smoking status: Never   Smokeless tobacco: Never  Vaping Use   Vaping status: Never Used  Substance and Sexual Activity   Alcohol use: Never   Drug use: Never   Social Drivers of Health   Financial Resource Strain: Medium Risk (04/06/2024)  Received from [facility]   Overall Financial Resource Strain (CARDIA)    Difficulty of Paying Living Expenses: Somewhat hard  Food Insecurity: No Food Insecurity (04/06/2024)   Received from [facility]   Hunger Vital Sign    Within the past 12 months, you worried that your food would run out before you got the money to buy more.: Never true    Within the past 12 months, the food you bought just didn't last and you didn't have money to get more.: Never true  Transportation Needs: No Transportation Needs (04/06/2024)   Received from [facility] - Transportation    Lack of Transportation (Medical): No    Lack of Transportation (Non-Medical): No   Physical Activity: Insufficiently Active (04/06/2024)   Received from [facility]   Exercise Vital Sign    On average, how many days per week do you engage in moderate to strenuous exercise (like a brisk walk)?: 3 days    On average, how many minutes do you engage in exercise at this level?: 10 min  Stress: No Stress Concern Present (04/06/2024)   Received from [facility] - Occupational Stress Questionnaire    Feeling of Stress : Only a little  Social Connections: Socially Integrated (04/06/2024)   Received from [facility]   Social Connection and Isolation Panel    In a typical week, how many times do you talk on the phone with family, friends, or neighbors?: More than three times a week    How often do you get together with friends or relatives?: Twice a week    How often do you attend church or religious services?: More than 4 times per year    Do you belong to any clubs or organizations such as church groups, unions, fraternal or athletic groups, or school groups?: Yes    How often do you attend meetings of the clubs or organizations you belong to?: More than 4 times per year    Are you married, widowed, divorced, separated, never married, or living with a partner?: Married    Objective:    Vitals:   07/03/24 1034 07/03/24 1035  BP: (!) 144/75   Pulse: 109   Temp: 36.8 C (98.3 F)   SpO2: 98%   Weight: (!) 115.2 kg (254 lb)   Height: 170.2 cm (5' 7)   PainSc:  0-No pain  PainLoc:  Abdomen    Body mass index is 39.78 kg/m.  Gen: A&Ox3, no distress  Chest: respiratory effort is normal. Abdomen: soft, nondistended, nontender.  Neuro: no gross deficit Psych: appropriate mood and affect, normal insight/judgment intact  Skin: warm and dry    Assessment and Plan:  Diagnoses and all orders for this visit:  Biliary colic    Crescendoing symptoms concerning for impending cholecystitis. I recommend proceeding with laparoscopic  cholecystectomy with possible cholangiogram.  Discussed the relevant anatomy using a diagram to demonstrate, and went over surgical technique.  Discussed risks of surgery including bleeding, infection, pain, scarring, intraabdominal injury specifically to the common bile duct and sequelae, subtotal cholecystectomy, bile leak, conversion to open surgery, failure to resolve symptoms, post-cholecystectomy diarrhea which is typically self-limited, blood clots/ pulmonary embolus, heart attack, pneumonia, stroke, etc. Questions were welcomed and answered to patient's satisfaction.  Patient wishes to proceed with surgery.     Mitzie Freund MD FACS

## 2024-07-03 NOTE — Progress Notes (Signed)
 Office: [phone]  /  Fax: [phone]  WEIGHT SUMMARY AND BIOMETRICS  Starting Date: 09/04/23  Starting Weight: 256lb   Weight Lost Since Last Visit: 4lb   Vitals Temp: 98 F (36.7 C) BP: 117/83 Pulse Rate: (!) 58 SpO2: 100 %   Body Composition  Body Fat %: 42.1 % Fat Mass (lbs): 105 lbs Muscle Mass (lbs): 137.4 lbs Total Body Water (lbs): 92.6 lbs Visceral Fat Rating : 10    HPI  Chief Complaint: OBESITY  Whitney Aguirre is here to discuss her progress with her obesity treatment plan. She is on the the Category 3 Plan and states she is following her eating plan approximately 0 % of the time. She states she is exercising 0 minutes 0 times per week.  Interval History:  Since last office visit she is down 4 lb Since she is up 2.6 pounds of muscle mass and down 6.4 pounds of body fat since last visit This gives her a net loss of 7 lb in 9 mos of medically supervised weight management She did meet with GI for epigastric pain and abdominal ultrasound showed cholelthiasis She has had 2 more GB attacks since last visit, one triggered by eating tacos and one from eating chips She remains OFF Zepbound  since 7/2 She meets with Dr Signe at [facility] today to discuss cholecystectomy Denies hunger or cravings off antiobesity medications  Pharmacotherapy: None  PHYSICAL EXAM:  Blood pressure 117/83, pulse (!) 58, temperature 98 F (36.7 C), height 5' 7 (1.702 m), weight 249 lb (112.9 kg), last menstrual period 06/10/2024, SpO2 100%. Body mass index is 39 kg/m.  General: She is overweight, cooperative, alert, well developed, and in no acute distress. PSYCH: Has normal mood, affect and thought process.   Lungs: Normal breathing effort, no conversational dyspnea.  ASSESSMENT AND PLAN  TREATMENT PLAN FOR OBESITY:  Recommended Dietary Goals  Whitney Aguirre is currently in the action stage of change. As such, her goal is to continue weight management plan. She has agreed to practicing  portion control and making smarter food choices, such as increasing vegetables and decreasing simple carbohydrates. Discussed the importance of a low-fat diet with gallstones  Behavioral Intervention  We discussed the following Behavioral Modification Strategies today: increasing lean protein intake to established goals, increasing vegetables, increasing lower glycemic fruits, increasing fiber rich foods, increasing water intake , keeping healthy foods at home, and continue to work on maintaining a reduced calorie state, getting the recommended amount of protein, incorporating whole foods, making healthy choices, staying well hydrated and practicing mindfulness when eating..  Additional resources provided today: NA  Recommended Physical Activity Goals  Whitney Aguirre has been advised to work up to 150 minutes of moderate intensity aerobic activity a week and strengthening exercises 2-3 times per week for cardiovascular health, weight loss maintenance and preservation of muscle mass.   She has agreed to Start aerobic activity with a goal of 150 minutes a week at moderate intensity.   Pharmacotherapy changes for the treatment of obesity: None  ASSOCIATED CONDITIONS ADDRESSED TODAY  Hepatic steatosis She does not confirm findings of hepatic steatosis on ultrasound.  Continue active plan for weight reduction.  Continue to work on a reduced calorie diet, low in saturated fat and added sugar.  Increase walking time to 30 minutes daily.  Class 2 severe obesity due to excess calories with serious comorbidity and body mass index (BMI) of 39.0 to 39.9 in adult [facility]) Consider restart GLP-1 receptor agonist after cholecystectomy FLP needs to be  repeated first Consider the roll of bariatric surgery given her progress over the past 9 mos  PCOS (polycystic ovarian syndrome) Continue metformin  per endocrinology for insulin  resistance related to PCOS.  Limit refined carbohydrates and added sugar.  Calculus of  gallbladder without cholecystitis without obstruction Keep upcoming visit with Dr. Signe today.  Keep diet low-fat Reviewed GI notes from 06/20/2024    She was informed of the importance of frequent follow up visits to maximize her success with intensive lifestyle modifications for her multiple health conditions.   ATTESTASTION STATEMENTS:  Reviewed by clinician on day of visit: allergies, medications, problem list, medical history, surgical history, family history, social history, and previous encounter notes pertinent to obesity diagnosis.   I have personally spent 30 minutes total time today in preparation, patient care, nutritional counseling and education,  and documentation for this visit, including the following: review of most recent clinical lab tests, reviewing outside records, reviewing medical assistant documentation, review and interpretation of bioimpedence results.     Whitney Aguirre, D.O. DABFM, DABOM [facility] [address] [place], [place] [address] [phone]

## 2024-07-03 NOTE — Patient Instructions (Signed)
 SURGICAL WAITING ROOM VISITATION  Patients having surgery or a procedure may have no more than 2 support people in the waiting area - these visitors may rotate.    Children under the age of 15 must have an adult with them who is not the patient.  Visitors with respiratory illnesses are discouraged from visiting and should remain at home.  If the patient needs to stay at the hospital during part of their recovery, the visitor guidelines for inpatient rooms apply. Pre-op nurse will coordinate an appropriate time for 1 support person to accompany patient in pre-op.  This support person may not rotate.    Please refer to the [facility] website for the visitor guidelines for Inpatients (after your surgery is over and you are in a regular room).       Your procedure is scheduled on: 07/08/24   Report to [facility] Main Entrance    Report to admitting at 11:15 AM   Call this number if you have problems the morning of surgery [phone]   Do not eat food :After Midnight.   After Midnight you may have the following liquids until 7:30 AM DAY OF SURGERY  Water Non-Citrus Juices (without pulp, NO RED-Apple, White grape, White cranberry) Black Coffee (NO MILK/CREAM OR CREAMERS, sugar ok)  Clear Tea (NO MILK/CREAM OR CREAMERS, sugar ok) regular and decaf                             Plain Jell-O (NO RED)                                           Fruit ices (not with fruit pulp, NO RED)                                     Popsicles (NO RED)                                                               Sports drinks like Gatorade (NO RED)                   FOLLOW BOWEL PREP AND ANY ADDITIONAL PRE OP INSTRUCTIONS YOU RECEIVED FROM YOUR SURGEON'S OFFICE!!!     Oral Hygiene is also important to reduce your risk of infection.                                    Remember - BRUSH YOUR TEETH THE MORNING OF SURGERY WITH YOUR REGULAR TOOTHPASTE   Stop all vitamins and herbal supplements  7 days before surgery.   Take these medicines the morning of surgery with A SIP OF WATER: famotidine (pepcid ), Levothyroxine , Metoprolol , Lo-estrin             You may not have any metal on your body including hair pins, jewelry, and body piercing             Do not wear make-up, lotions, powders, perfumes/cologne, or deodorant  Do not wear nail polish including gel and S&S, artificial/acrylic nails, or any other type of covering on natural nails including finger and toenails. If you have artificial nails, gel coating, etc. that needs to be removed by a nail salon please have this removed prior to surgery or surgery may need to be canceled/ delayed if the surgeon/ anesthesia feels like they are unable to be safely monitored.   Do not shave  48 hours prior to surgery.    Do not bring valuables to the hospital. [facility] IS NOT             RESPONSIBLE   FOR VALUABLES.   Contacts, glasses, dentures or bridgework may not be worn into surgery.  DO NOT BRING YOUR HOME MEDICATIONS TO THE HOSPITAL. PHARMACY WILL DISPENSE MEDICATIONS LISTED ON YOUR MEDICATION LIST TO YOU DURING YOUR ADMISSION IN THE HOSPITAL!    Patients discharged on the day of surgery will not be allowed to drive home.  Someone NEEDS to stay with you for the first 24 hours after anesthesia.   Special Instructions: Bring a copy of your healthcare power of attorney and living will documents the day of surgery if you haven't scanned them before.              Please read over the following fact sheets you were given: IF YOU HAVE QUESTIONS ABOUT YOUR PRE-OP INSTRUCTIONS PLEASE CALL [phone] [place]   If you received a COVID test during your pre-op visit  it is requested that you wear a mask when out in public, stay away from anyone that may not be feeling well and notify your surgeon if you develop symptoms. If you test positive for Covid or have been in contact with anyone that has tested positive in the last 10 days please notify  you surgeon.    [facility] - Preparing for Surgery Before surgery, you can play an important role.  Because skin is not sterile, your skin needs to be as free of germs as possible.  You can reduce the number of germs on your skin by washing with CHG (chlorahexidine gluconate) soap before surgery.  CHG is an antiseptic cleaner which kills germs and bonds with the skin to continue killing germs even after washing. Please DO NOT use if you have an allergy to CHG or antibacterial soaps.  If your skin becomes reddened/irritated stop using the CHG and inform your nurse when you arrive at Short Stay. Do not shave (including legs and underarms) for at least 48 hours prior to the first CHG shower.  You may shave your face/neck.  Please follow these instructions carefully:  1.  Shower with CHG Soap the night before surgery and the  morning of surgery.  2.  If you choose to wash your hair, wash your hair first as usual with your normal  shampoo.  3.  After you shampoo, rinse your hair and body thoroughly to remove the shampoo.                             4.  Use CHG as you would any other liquid soap.  You can apply chg directly to the skin and wash.  Gently with a scrungie or clean washcloth.  5.  Apply the CHG Soap to your body ONLY FROM THE NECK DOWN.   Do   not use on face/ open  Wound or open sores. Avoid contact with eyes, ears mouth and   genitals (private parts).                       Wash face,  Genitals (private parts) with your normal soap.             6.  Wash thoroughly, paying special attention to the area where your    surgery  will be performed.  7.  Thoroughly rinse your body with warm water from the neck down.  8.  DO NOT shower/wash with your normal soap after using and rinsing off the CHG Soap.                9.  Pat yourself dry with a clean towel.            10.  Wear clean pajamas.            11.  Place clean sheets on your bed the night of your first shower  and do not  sleep with pets. Day of Surgery : Do not apply any lotions/deodorants the morning of surgery.  Please wear clean clothes to the hospital/surgery center.  FAILURE TO FOLLOW THESE INSTRUCTIONS MAY RESULT IN THE CANCELLATION OF YOUR SURGERY  PATIENT SIGNATURE_________________________________  NURSE SIGNATURE__________________________________  ________________________________________________________________________

## 2024-07-03 NOTE — Progress Notes (Signed)
 COVID Vaccine received:  [x]  No []  Yes Date of any COVID positive Test in last 90 days: no PCP - Betty Swaziland MD Cardiologist - Mihai Croitoru MD  Chest x-ray -  EKG -   Stress Test -  ECHO - 06/29/22 Epic Cardiac Cath -   Bowel Prep - [x]  No  []   Yes ______  Pacemaker / ICD device [x]  No []  Yes   Spinal Cord Stimulator:[x]  No []  Yes       History of Sleep Apnea? [x]  No []  Yes   CPAP used?- [x]  No []  Yes    Does the patient monitor blood sugar?          [x]  No []  Yes  []  N/A  Patient has: [x]  NO Hx DM   []  Pre-DM                 []  DM1  []   DM2 Does patient have a [employer] or [employer]? []  No []  Yes   Fasting Blood Sugar Ranges-  Checks Blood Sugar _____ times a day  GLP1 agonist / usual dose - n/a GLP1 instructions:  SGLT-2 inhibitors / usual dose - n/a SGLT-2 instructions:   Blood Thinner / Instructions:no Aspirin Instructions:no  Comments:   Activity level: Patient is able to climb a flight of stairs without difficulty; [x]  No CP  [x]  No SOB,    Patient can perform ADLs without assistance.   Anesthesia review:   Patient denies shortness of breath, fever, cough and chest pain at PAT appointment.  Patient verbalized understanding and agreement to the Pre-Surgical Instructions that were given to them at this PAT appointment. Patient was also educated of the need to review these PAT instructions again prior to his/her surgery.I reviewed the appropriate phone numbers to call if they have any and questions or concerns.

## 2024-07-04 ENCOUNTER — Encounter (HOSPITAL_COMMUNITY)
Admission: RE | Admit: 2024-07-04 | Discharge: 2024-07-04 | Disposition: A | Discharge status: Home / Self Care | Source: Ambulatory Visit | Attending: Surgery | Admitting: Surgery

## 2024-07-04 ENCOUNTER — Encounter (HOSPITAL_COMMUNITY): Payer: Self-pay

## 2024-07-04 ENCOUNTER — Other Ambulatory Visit: Payer: Self-pay

## 2024-07-04 DIAGNOSIS — Z01818 Encounter for other preprocedural examination: Secondary | ICD-10-CM | POA: Insufficient documentation

## 2024-07-04 HISTORY — DX: Cardiac arrhythmia, unspecified: I49.9

## 2024-07-04 HISTORY — DX: Gastro-esophageal reflux disease without esophagitis: K21.9

## 2024-07-08 ENCOUNTER — Other Ambulatory Visit: Payer: Self-pay

## 2024-07-08 ENCOUNTER — Ambulatory Visit (HOSPITAL_COMMUNITY)
Admission: RE | Admit: 2024-07-08 | Discharge: 2024-07-08 | Disposition: A | Discharge status: Home / Self Care | Source: Ambulatory Visit | Attending: Surgery | Admitting: Surgery

## 2024-07-08 ENCOUNTER — Ambulatory Visit (HOSPITAL_COMMUNITY): Payer: Self-pay | Admitting: Certified Registered Nurse Anesthetist

## 2024-07-08 ENCOUNTER — Other Ambulatory Visit: Payer: Self-pay | Admitting: Family Medicine

## 2024-07-08 ENCOUNTER — Encounter (HOSPITAL_COMMUNITY): Payer: Self-pay | Admitting: Surgery

## 2024-07-08 ENCOUNTER — Other Ambulatory Visit: Payer: Self-pay | Admitting: Neurology

## 2024-07-08 ENCOUNTER — Encounter (HOSPITAL_COMMUNITY)
Admission: RE | Disposition: A | Discharge status: Home / Self Care | Payer: Self-pay | Source: Ambulatory Visit | Attending: Surgery

## 2024-07-08 DIAGNOSIS — E559 Vitamin D deficiency, unspecified: Secondary | ICD-10-CM

## 2024-07-08 DIAGNOSIS — Z79899 Other long term (current) drug therapy: Secondary | ICD-10-CM | POA: Insufficient documentation

## 2024-07-08 DIAGNOSIS — F32A Depression, unspecified: Secondary | ICD-10-CM | POA: Insufficient documentation

## 2024-07-08 DIAGNOSIS — K760 Fatty (change of) liver, not elsewhere classified: Secondary | ICD-10-CM | POA: Diagnosis not present

## 2024-07-08 DIAGNOSIS — Z7985 Long-term (current) use of injectable non-insulin antidiabetic drugs: Secondary | ICD-10-CM | POA: Insufficient documentation

## 2024-07-08 DIAGNOSIS — Z7984 Long term (current) use of oral hypoglycemic drugs: Secondary | ICD-10-CM | POA: Diagnosis not present

## 2024-07-08 DIAGNOSIS — K8044 Calculus of bile duct with chronic cholecystitis without obstruction: Secondary | ICD-10-CM | POA: Diagnosis not present

## 2024-07-08 DIAGNOSIS — Z6839 Body mass index (BMI) 39.0-39.9, adult: Secondary | ICD-10-CM | POA: Insufficient documentation

## 2024-07-08 DIAGNOSIS — F419 Anxiety disorder, unspecified: Secondary | ICD-10-CM | POA: Insufficient documentation

## 2024-07-08 DIAGNOSIS — E039 Hypothyroidism, unspecified: Secondary | ICD-10-CM | POA: Diagnosis not present

## 2024-07-08 DIAGNOSIS — K8050 Calculus of bile duct without cholangitis or cholecystitis without obstruction: Secondary | ICD-10-CM | POA: Insufficient documentation

## 2024-07-08 DIAGNOSIS — D649 Anemia, unspecified: Secondary | ICD-10-CM | POA: Diagnosis not present

## 2024-07-08 DIAGNOSIS — Z5986 Financial insecurity: Secondary | ICD-10-CM | POA: Insufficient documentation

## 2024-07-08 DIAGNOSIS — K219 Gastro-esophageal reflux disease without esophagitis: Secondary | ICD-10-CM | POA: Diagnosis not present

## 2024-07-08 DIAGNOSIS — E282 Polycystic ovarian syndrome: Secondary | ICD-10-CM | POA: Insufficient documentation

## 2024-07-08 DIAGNOSIS — K824 Cholesterolosis of gallbladder: Secondary | ICD-10-CM | POA: Diagnosis not present

## 2024-07-08 DIAGNOSIS — E66813 Obesity, class 3: Secondary | ICD-10-CM | POA: Insufficient documentation

## 2024-07-08 HISTORY — PX: CHOLECYSTECTOMY: SHX55

## 2024-07-08 LAB — POCT PREGNANCY, URINE: Preg Test, Ur: NEGATIVE

## 2024-07-08 SURGERY — LAPAROSCOPIC CHOLECYSTECTOMY
Anesthesia: General

## 2024-07-08 MED ORDER — ACETAMINOPHEN 650 MG RE SUPP: 650.0000 mg | RECTAL | Status: DC | PRN

## 2024-07-08 MED ORDER — LACTATED RINGERS IR SOLN
Status: DC | PRN
  Administered 2024-07-08 (×2): 1

## 2024-07-08 MED ORDER — FENTANYL CITRATE (PF) 100 MCG/2ML IJ SOLN
INTRAMUSCULAR | Status: AC
Start: 2024-07-08 — End: 2024-07-08
  Filled 2024-07-08: qty 2

## 2024-07-08 MED ORDER — HYDROMORPHONE HCL 1 MG/ML IJ SOLN: 0.2500 mg | INTRAMUSCULAR | Status: DC | PRN

## 2024-07-08 MED ORDER — DOCUSATE SODIUM 100 MG PO CAPS
100.0000 mg | ORAL_CAPSULE | Freq: Two times a day (BID) | ORAL | 0 refills | Status: AC
Start: 2024-07-08 — End: 2024-08-07

## 2024-07-08 MED ORDER — MIDAZOLAM HCL 2 MG/2ML IJ SOLN
INTRAMUSCULAR | Status: AC
  Filled 2024-07-08: qty 2

## 2024-07-08 MED ORDER — GABAPENTIN 300 MG PO CAPS
300.0000 mg | ORAL_CAPSULE | ORAL | Status: AC
  Administered 2024-07-08 (×2): 300 mg via ORAL
  Filled 2024-07-08: qty 1

## 2024-07-08 MED ORDER — DROPERIDOL 2.5 MG/ML IJ SOLN
INTRAMUSCULAR | Status: AC
  Filled 2024-07-08: qty 2

## 2024-07-08 MED ORDER — ACETAMINOPHEN 325 MG PO TABS: 650.0000 mg | ORAL_TABLET | ORAL | Status: DC | PRN

## 2024-07-08 MED ORDER — PROPOFOL 10 MG/ML IV BOLUS
INTRAVENOUS | Status: AC
  Filled 2024-07-08: qty 20

## 2024-07-08 MED ORDER — DIPHENHYDRAMINE HCL 50 MG/ML IJ SOLN
INTRAMUSCULAR | Status: DC | PRN
Start: 2024-07-08 — End: 2024-07-08
  Administered 2024-07-08 (×2): 12.5 mg via INTRAVENOUS

## 2024-07-08 MED ORDER — PHENYLEPHRINE 80 MCG/ML (10ML) SYRINGE FOR IV PUSH (FOR BLOOD PRESSURE SUPPORT)
PREFILLED_SYRINGE | INTRAVENOUS | Status: DC | PRN
  Administered 2024-07-08 (×8): 40 ug via INTRAVENOUS

## 2024-07-08 MED ORDER — ROCURONIUM BROMIDE 10 MG/ML (PF) SYRINGE
PREFILLED_SYRINGE | INTRAVENOUS | Status: DC | PRN
  Administered 2024-07-08 (×2): 60 mg via INTRAVENOUS

## 2024-07-08 MED ORDER — CHLORHEXIDINE GLUCONATE 4 % EX SOLN: 60.0000 mL | Freq: Once | CUTANEOUS | Status: DC

## 2024-07-08 MED ORDER — BUPIVACAINE-EPINEPHRINE 0.25% -1:200000 IJ SOLN
INTRAMUSCULAR | Status: DC | PRN
Start: 2024-07-08 — End: 2024-07-08
  Administered 2024-07-08 (×2): 30 mL

## 2024-07-08 MED ORDER — DEXAMETHASONE SODIUM PHOSPHATE 10 MG/ML IJ SOLN
INTRAMUSCULAR | Status: DC | PRN
  Administered 2024-07-08 (×2): 10 mg via INTRAVENOUS

## 2024-07-08 MED ORDER — ACETAMINOPHEN 500 MG PO TABS
1000.0000 mg | ORAL_TABLET | ORAL | Status: AC
  Administered 2024-07-08 (×2): 1000 mg via ORAL
  Filled 2024-07-08: qty 2

## 2024-07-08 MED ORDER — SCOPOLAMINE 1 MG/3DAYS TD PT72
MEDICATED_PATCH | TRANSDERMAL | Status: AC
Start: 2024-07-08 — End: 2024-07-08
  Filled 2024-07-08: qty 1

## 2024-07-08 MED ORDER — TRAMADOL HCL 50 MG PO TABS: 50.0000 mg | ORAL_TABLET | Freq: Four times a day (QID) | ORAL | 0 refills | Status: AC | PRN

## 2024-07-08 MED ORDER — CEFAZOLIN SODIUM-DEXTROSE 2-4 GM/100ML-% IV SOLN
2.0000 g | INTRAVENOUS | Status: AC
  Administered 2024-07-08 (×2): 2 g via INTRAVENOUS
  Filled 2024-07-08: qty 100

## 2024-07-08 MED ORDER — ORAL CARE MOUTH RINSE: 15.0000 mL | Freq: Once | OROMUCOSAL | Status: AC

## 2024-07-08 MED ORDER — MIDAZOLAM HCL 2 MG/2ML IJ SOLN
INTRAMUSCULAR | Status: DC | PRN
  Administered 2024-07-08 (×4): 1 mg via INTRAVENOUS

## 2024-07-08 MED ORDER — PROPOFOL 10 MG/ML IV BOLUS
INTRAVENOUS | Status: DC | PRN
  Administered 2024-07-08 (×2): 200 mg via INTRAVENOUS

## 2024-07-08 MED ORDER — INDOCYANINE GREEN 25 MG IV SOLR
1.2500 mg | Freq: Once | INTRAVENOUS | Status: AC
  Administered 2024-07-08 (×2): 1.25 mg via INTRAVENOUS
  Filled 2024-07-08: qty 10

## 2024-07-08 MED ORDER — CHLORHEXIDINE GLUCONATE 0.12 % MT SOLN
15.0000 mL | Freq: Once | OROMUCOSAL | Status: AC
  Administered 2024-07-08 (×2): 15 mL via OROMUCOSAL

## 2024-07-08 MED ORDER — FENTANYL CITRATE PF 50 MCG/ML IJ SOSY: 25.0000 ug | PREFILLED_SYRINGE | INTRAMUSCULAR | Status: DC | PRN

## 2024-07-08 MED ORDER — LIDOCAINE HCL (PF) 2 % IJ SOLN
INTRAMUSCULAR | Status: DC | PRN
  Administered 2024-07-08 (×2): 60 mg via INTRADERMAL

## 2024-07-08 MED ORDER — BUPIVACAINE-EPINEPHRINE (PF) 0.25% -1:200000 IJ SOLN
INTRAMUSCULAR | Status: AC
  Filled 2024-07-08: qty 30

## 2024-07-08 MED ORDER — OXYCODONE HCL 5 MG PO TABS: 5.0000 mg | ORAL_TABLET | ORAL | Status: DC | PRN

## 2024-07-08 MED ORDER — 0.9 % SODIUM CHLORIDE (POUR BTL) OPTIME
TOPICAL | Status: DC | PRN
  Administered 2024-07-08 (×2): 1000 mL

## 2024-07-08 MED ORDER — SCOPOLAMINE 1 MG/3DAYS TD PT72
MEDICATED_PATCH | TRANSDERMAL | Status: DC | PRN
  Administered 2024-07-08 (×2): 1 via TRANSDERMAL

## 2024-07-08 MED ORDER — DROPERIDOL 2.5 MG/ML IJ SOLN
0.6250 mg | Freq: Once | INTRAMUSCULAR | Status: AC
  Administered 2024-07-08 (×2): 0.625 mg via INTRAVENOUS

## 2024-07-08 MED ORDER — LACTATED RINGERS IV SOLN: INTRAVENOUS | Status: DC

## 2024-07-08 MED ORDER — FENTANYL CITRATE (PF) 100 MCG/2ML IJ SOLN
INTRAMUSCULAR | Status: DC | PRN
  Administered 2024-07-08 (×2): 50 ug via INTRAVENOUS
  Administered 2024-07-08 (×2): 100 ug via INTRAVENOUS

## 2024-07-08 MED ORDER — ACETAMINOPHEN 10 MG/ML IV SOLN
INTRAVENOUS | Status: AC
  Filled 2024-07-08: qty 100

## 2024-07-08 MED ORDER — ONDANSETRON HCL 4 MG/2ML IJ SOLN
INTRAMUSCULAR | Status: DC | PRN
  Administered 2024-07-08 (×2): 4 mg via INTRAVENOUS

## 2024-07-08 MED ORDER — SUGAMMADEX SODIUM 200 MG/2ML IV SOLN
INTRAVENOUS | Status: DC | PRN
  Administered 2024-07-08 (×2): 200 mg via INTRAVENOUS

## 2024-07-08 SURGICAL SUPPLY — 36 items
BAG COUNTER SPONGE SURGICOUNT (BAG) ×1 IMPLANT
BENZOIN TINCTURE PRP APPL 2/3 (GAUZE/BANDAGES/DRESSINGS) IMPLANT
BNDG ADH 1X3 SHEER STRL LF (GAUZE/BANDAGES/DRESSINGS) IMPLANT
CABLE HIGH FREQUENCY MONO STRZ (ELECTRODE) ×1 IMPLANT
CHLORAPREP W/TINT 26 (MISCELLANEOUS) ×1 IMPLANT
CLIP APPLIE ROT 10 11.4 M/L (STAPLE) ×1 IMPLANT
COVER MAYO STAND XLG (MISCELLANEOUS) IMPLANT
COVER SURGICAL LIGHT HANDLE (MISCELLANEOUS) ×1 IMPLANT
DERMABOND ADVANCED .7 DNX12 (GAUZE/BANDAGES/DRESSINGS) IMPLANT
DERMABOND ADVANCED .7 DNX6 (GAUZE/BANDAGES/DRESSINGS) IMPLANT
DRAPE C-ARM 42X120 X-RAY (DRAPES) IMPLANT
ELECT REM PT RETURN 15FT ADLT (MISCELLANEOUS) ×1 IMPLANT
ENDOLOOP SUT PDS II 0 18 (SUTURE) IMPLANT
GLOVE BIO SURGEON STRL SZ 6 (GLOVE) ×1 IMPLANT
GLOVE INDICATOR 6.5 STRL GRN (GLOVE) ×1 IMPLANT
GOWN STRL REUS W/ TWL LRG LVL3 (GOWN DISPOSABLE) ×1 IMPLANT
GRASPER SUT TROCAR 14GX15 (MISCELLANEOUS) ×1 IMPLANT
HEMOSTAT SNOW SURGICEL 2X4 (HEMOSTASIS) IMPLANT
IRRIGATION SUCT STRKRFLW 2 WTP (MISCELLANEOUS) ×1 IMPLANT
KIT BASIN OR (CUSTOM PROCEDURE TRAY) ×1 IMPLANT
KIT TURNOVER KIT A (KITS) ×1 IMPLANT
NDL INSUFFLATION 14GA 120MM (NEEDLE) ×1 IMPLANT
NEEDLE INSUFFLATION 14GA 120MM (NEEDLE) ×1 IMPLANT
SCISSORS LAP 5X35 DISP (ENDOMECHANICALS) ×1 IMPLANT
SET CHOLANGIOGRAPH MIX (MISCELLANEOUS) IMPLANT
SET TUBE SMOKE EVAC HIGH FLOW (TUBING) ×1 IMPLANT
SLEEVE Z-THREAD 5X100MM (TROCAR) ×1 IMPLANT
SPIKE FLUID TRANSFER (MISCELLANEOUS) ×1 IMPLANT
STRIP CLOSURE SKIN 1/2X4 (GAUZE/BANDAGES/DRESSINGS) IMPLANT
SUT MNCRL AB 4-0 PS2 18 (SUTURE) ×1 IMPLANT
SYSTEM BAG RETRIEVAL 10MM (BASKET) IMPLANT
TOWEL OR 17X26 10 PK STRL BLUE (TOWEL DISPOSABLE) ×1 IMPLANT
TRAY LAPAROSCOPIC (CUSTOM PROCEDURE TRAY) ×1 IMPLANT
TROCAR ADV FIXATION 12X100MM (TROCAR) ×1 IMPLANT
TROCAR XCEL NON-BLD 5MMX100MML (ENDOMECHANICALS) IMPLANT
TROCAR Z-THREAD OPTICAL 5X100M (TROCAR) ×1 IMPLANT

## 2024-07-08 NOTE — Interval H&P Note (Signed)
 History and Physical Interval Note:  07/08/2024 1:40 PM  Whitney Aguirre  has presented today for surgery, with the diagnosis of BILIARY COLIC.  The various methods of treatment have been discussed with the patient and family. After consideration of risks, benefits and other options for treatment, the patient has consented to  Procedure(s) with comments: LAPAROSCOPIC CHOLECYSTECTOMY (N/A) - ICG as a surgical intervention.  The patient's history has been reviewed, patient examined, no change in status, stable for surgery.  I have reviewed the patient's chart and labs.  Questions were answered to the patient's satisfaction.     Kaidon Kinker DELENA Freund

## 2024-07-08 NOTE — Anesthesia Preprocedure Evaluation (Addendum)
 Anesthesia Evaluation  Patient identified by MRN, date of birth, ID band Patient awake    Reviewed: Allergy & Precautions, H&P , NPO status , Patient's Chart, lab work & pertinent test results  Airway Mallampati: I  TM Distance: >3 FB Neck ROM: Full    Dental no notable dental hx. (+) Teeth Intact   Pulmonary neg pulmonary ROS   Pulmonary exam normal breath sounds clear to auscultation       Cardiovascular negative cardio ROS  Rhythm:Regular Rate:Normal     Neuro/Psych  Headaches  Anxiety Depression       GI/Hepatic Neg liver ROS,GERD  Medicated,,  Endo/Other  Hypothyroidism  Class 3 obesity  Renal/GU negative Renal ROS  negative genitourinary   Musculoskeletal   Abdominal   Peds  Hematology  (+) Blood dyscrasia, anemia   Anesthesia Other Findings   Reproductive/Obstetrics negative OB ROS                              Anesthesia Physical Anesthesia Plan  ASA: 3  Anesthesia Plan: General   Post-op Pain Management: Tylenol  PO (pre-op)* and Toradol IV (intra-op)*   Induction: Intravenous  PONV Risk Score and Plan: 4 or greater and Ondansetron , Dexamethasone  and Midazolam   Airway Management Planned: Oral ETT  Additional Equipment:   Intra-op Plan:   Post-operative Plan: Extubation in OR  Informed Consent: I have reviewed the patients History and Physical, chart, labs and discussed the procedure including the risks, benefits and alternatives for the proposed anesthesia with the patient or authorized representative who has indicated his/her understanding and acceptance.     Dental advisory given  Plan Discussed with: CRNA  Anesthesia Plan Comments:          Anesthesia Quick Evaluation

## 2024-07-08 NOTE — Op Note (Signed)
 Operative Note  Whitney Aguirre 33 y.o. female [id number]  07/08/2024  Surgeon: Mitzie DELENA Freund MD FACS  Assistant: Eva Barrier MD (PGY4)  I was personally present during the key and critical portions of this procedure and immediately available throughout the entire procedure, as documented in my operative note.   Procedure performed: Laparoscopic Cholecystectomy with near-infrared fluorescent cholangiography  Procedure classification: urgent  Preop diagnosis: biliary colic Post-op diagnosis/intraop findings: same, chronic cholecystitis, hepatic steatosis  Specimens: gallbladder  Retained items: none  EBL: minimal  Complications: none  Description of procedure: After confirming informed consent the patient was brought to the operating room. Antibiotics were administered. SCD's were applied. General endotracheal anesthesia was initiated and a formal time-out was performed. The abdomen was prepped and draped in the usual sterile fashion and the abdomen was entered using a left subcostal veress needle after instilling the site with local. Insufflation to was obtained, 5mm trocar and camera inserted using optical entry in the right upper quadrant, and gross inspection revealed no evidence of injury from our entry or other intraabdominal abnormalities. The liver does appear enlarged and rounded consistent with hepatic steatosis. Two 5mm trocars were introduced in the supraumbilical and right anterior axillary lines under direct visualization and following infiltration with local. A 12mm trocar was placed in the epigastrium. The gallbladder fundus was retracted cephalad and the infundibulum was retracted laterally. Adhesions to the gallbladder body and infundibulum were carefully divided with cautery, protecting the nearby duodenum and allowing this to fall away. A combination of hook electrocautery and blunt dissection was utilized to clear the peritoneum from the gallbladder neck  and cystic duct, circumferentially isolating the cystic artery and cystic duct and lifting the gallbladder from the cystic plate. The critical view of safety was achieved with the cystic artery, cystic duct, and liver bed visualized between them with no other structures. Near-infrared fluorescent cholangiography was then activated and demonstrated concordant anatomy. This also illuminated the common bile duct medially and this was well away from the area of dissection.  The cystic artery was clipped with a single clip proximally and distally and divided as was the cystic duct with three clips on the proximal end. The gallbladder was dissected from the liver plate using electrocautery. Once freed the gallbladder was placed in an endocatch bag and removed intact through the epigastric trocar site. A minimal amount of bleeding on the liver bed was controlled with cautery. Hemostasis was once again confirmed, and reinspection of the abdomen revealed no injuries. The clips were well apposed without any bile leak from the ligated cystic duct remnant or the liver bed either on direct inspection nor with near-infrared fluorescent cholangiography. The 12mm trocar site in the epigastrium was closed simple interrupted 0 vicryls in the fascia under direct visualization using a PMI device. The abdomen was desufflated and all trocars removed. The skin incisions were closed with subcuticular 4-0 monocryl and Dermabond. The patient was awakened, extubated and transported to the recovery room in stable condition.    All counts were correct at the completion of the case.

## 2024-07-08 NOTE — Discharge Instructions (Signed)
 LAPAROSCOPIC SURGERY: POST OP INSTRUCTIONS   EAT Gradually transition to your usual diet over the next few days after discharge.  WALK Walk an hour a day (cumulative- not all at once).  Control your pain to do that.    CONTROL PAIN Control pain so that you can walk, sleep, tolerate sneezing/coughing, go up/down stairs.  HAVE A BOWEL MOVEMENT DAILY Keep your bowels regular to avoid problems.  OK to try a laxative to override constipation.  OK to use an antidiarrheal to slow down diarrhea.  Call if not better after 2 tries  CALL IF YOU HAVE PROBLEMS/CONCERNS Call if you are still struggling despite following these instructions. Call if you have concerns not answered by these instructions    DIET: Follow a light bland diet & liquids the first 24 hours after arrival home, such as soup, liquids, starches, etc.  Be sure to drink plenty of fluids.  Quickly advance to a usual solid diet within a few days.  Avoid fast food or heavy meals initially as you are more likely to get nauseated or have irregular bowels.  A low-sugar, high-fiber diet for the rest of your life is ideal.  Take your usually prescribed home medications unless otherwise directed.  PAIN CONTROL: Pain is best controlled by a usual combination of three different methods TOGETHER: Ice/Heat Over the counter pain medication Prescription pain medication Most patients will experience some swelling and bruising around the incisions.  Ice packs or heating pads (30-60 minutes up to 6 times a day) will help. Use ice for the first few days to help decrease swelling and bruising, then switch to heat to help relax tight/sore spots and speed recovery.  Some people prefer to use ice alone, heat alone, alternating between ice & heat.  Experiment to what works for you.  Swelling and bruising can take several weeks to resolve.   It is helpful to take an over-the-counter pain medication regularly for the first few days: Naproxen (Aleve, etc)   Two 220mg  tabs twice a day OR Ibuprofen  (Advil , etc) Three 200mg  tabs four times a day (every meal & bedtime) AND Acetaminophen  (Tylenol , etc) 500-650mg  four times a day (every meal & bedtime) A  prescription for pain medication (such as oxycodone , hydrocodone , tramadol , gabapentin , methocarbamol , etc) should be given to you upon discharge.  Take your pain medication as prescribed, IF NEEDED.  If you are having problems/concerns with the prescription medicine (does not control pain, nausea, vomiting, rash, itching, etc), please call us  [phone] to see if we need to switch you to a different pain medicine that will work better for you and/or control your side effect better. If you need a refill on your pain medication, please give us  48 hour notice.  contact your pharmacy.  They will contact our office to request authorization. Prescriptions will not be filled after 5 pm or on week-ends  Avoid getting constipated.   Between the surgery and the pain medications, it is common to experience some constipation.   Increasing fluid intake and taking a fiber supplement (such as Metamucil, Citrucel, FiberCon, MiraLax, etc) 1-2 times a day regularly will usually help prevent this problem from occurring.   A mild laxative (prune juice, Milk of Magnesia, MiraLax, etc) should be taken according to package directions if there are no bowel movements after 48 hours.   Watch out for diarrhea.   If you have many loose bowel movements, simplify your diet to bland foods & liquids for a few days.  Stop any stool softeners and decrease your fiber supplement.   Switching to mild anti-diarrheal medications (Kayopectate, Pepto Bismol) can help.   If this worsens or does not improve, please call us .  Wash / shower every day.  You may shower over the skin glue which is waterproof.  Do not soak or submerge incisions.  No rubbing, scrubbing, lotions or ointments to incisions.  Glue will flake off after about 2 weeks.   You may leave the incision open to air.  You may replace a dressing/Band-Aid to cover the incision for comfort if you wish.   ACTIVITIES as tolerated:   You may resume regular (light) daily activities beginning the next day--such as daily self-care, walking, climbing stairs--gradually increasing activities as tolerated.  If you can walk 30 minutes without difficulty, it is safe to try more intense activity such as jogging, treadmill, bicycling, low-impact aerobics, swimming, etc. Save the most intensive and strenuous activity for last such as sit-ups, heavy lifting, contact sports, etc  Refrain from any heavy lifting or straining until you are off narcotics for pain control.   DO NOT PUSH THROUGH PAIN.  Let pain be your guide: If it hurts to do something, don't do it.  Pain is your body warning you to avoid that activity for another week until the pain goes down. You may drive when you are no longer taking prescription pain medication, you can comfortably wear a seatbelt, and you can safely maneuver your car and apply brakes. You may have sexual intercourse when it is comfortable.  FOLLOW UP in our office Please call [facility] at [phone] to set up an appointment to see your surgeon in the office for a follow-up appointment approximately 2-3 weeks after your surgery. Make sure that you call for this appointment the day you arrive home to insure a convenient appointment time.  10. IF YOU HAVE DISABILITY OR FAMILY LEAVE FORMS, BRING THEM TO THE OFFICE FOR PROCESSING.  DO NOT GIVE THEM TO YOUR DOCTOR.   WHEN TO CALL US  [phone]: Poor pain control Reactions / problems with new medications (rash/itching, nausea, etc)  Fever over 101.5 F (38.5 C) Inability to urinate Nausea and/or vomiting Worsening swelling or bruising Continued bleeding from incision. Increased pain, redness, or drainage from the incision   The clinic staff is available to answer your questions during regular business  hours (8:30am-5pm).  Please don't hesitate to call and ask to speak to one of our nurses for clinical concerns.   If you have a medical emergency, go to the nearest emergency room or call 911.  A surgeon from [facility] is always on call at the [facility], [place] [address], [address], [place], [place]  [address] ? MAIN: [phone] ? TOLL FREE: [phone] ?  FAX [phone] [email or web address]

## 2024-07-08 NOTE — Anesthesia Procedure Notes (Signed)
 Procedure Name: Intubation Date/Time: 07/08/2024 2:22 PM  Performed by: Joshua Vernell BROCKS, CRNAPre-anesthesia Checklist: Patient identified, Emergency Drugs available, Suction available and Patient being monitored Patient Re-evaluated:Patient Re-evaluated prior to induction Oxygen Delivery Method: Circle system utilized Preoxygenation: Pre-oxygenation with 100% oxygen Induction Type: IV induction Ventilation: Mask ventilation without difficulty Laryngoscope Size: Mac and 3 Grade View: Grade I Tube type: Oral Number of attempts: 1 Airway Equipment and Method: Stylet and Oral airway Placement Confirmation: ETT inserted through vocal cords under direct vision, positive ETCO2 and breath sounds checked- equal and bilateral Secured at: 21 cm Tube secured with: Tape Dental Injury: Teeth and Oropharynx as per pre-operative assessment

## 2024-07-08 NOTE — Transfer of Care (Signed)
 Immediate Anesthesia Transfer of Care Note  Patient: Whitney Aguirre  Procedure(s) Performed: LAPAROSCOPIC CHOLECYSTECTOMY  Patient Location: PACU  Anesthesia Type:General  Level of Consciousness: sedated  Airway & Oxygen Therapy: Patient Spontanous Breathing and Patient connected to face mask oxygen  Post-op Assessment: Report given to RN and Post -op Vital signs reviewed and stable  Post vital signs: Reviewed and stable  Last Vitals:  Vitals Value Taken Time  BP 147/66 07/08/24 15:40  Temp    Pulse 64 07/08/24 15:43  Resp 18 07/08/24 15:43  SpO2 100 % 07/08/24 15:43  Vitals shown include unfiled device data.  Last Pain:  Vitals:   07/08/24 1205  TempSrc: Oral         Complications: No notable events documented.

## 2024-07-09 ENCOUNTER — Encounter (HOSPITAL_COMMUNITY): Payer: Self-pay | Admitting: Surgery

## 2024-07-09 NOTE — Anesthesia Postprocedure Evaluation (Signed)
 Anesthesia Post Note  Patient: Whitney Aguirre  Procedure(s) Performed: LAPAROSCOPIC CHOLECYSTECTOMY     Patient location during evaluation: PACU Anesthesia Type: General Level of consciousness: awake and alert Pain management: pain level controlled Vital Signs Assessment: post-procedure vital signs reviewed and stable Respiratory status: spontaneous breathing, nonlabored ventilation and respiratory function stable Cardiovascular status: blood pressure returned to baseline and stable Postop Assessment: no apparent nausea or vomiting Anesthetic complications: no   No notable events documented.  Last Vitals:  Vitals:   07/08/24 1600 07/08/24 1615  BP: 132/69 122/74  Pulse: (!) 56 60  Resp: 15 16  Temp:  36.8 C  SpO2: 95% 93%    Last Pain:  Vitals:   07/08/24 1615  TempSrc:   PainSc: 0-No pain                 Coron Rossano,W. EDMOND

## 2024-07-09 NOTE — Progress Notes (Signed)
 Agree with the assessment and plan as outlined by Brigitte Canard, PA-C.

## 2024-07-10 LAB — SURGICAL PATHOLOGY

## 2024-07-21 ENCOUNTER — Telehealth: Payer: Self-pay | Admitting: *Deleted

## 2024-07-21 NOTE — Telephone Encounter (Signed)
 Prior authorization done via cover my meds for patients Zepbound. Waiting on determination.

## 2024-07-21 NOTE — Telephone Encounter (Signed)
 Zepbound  was approved.  Message from Plan CaseId:[id number];Status:Approved;Review Type:Prior Auth;Coverage Start Date:06/21/2024;Coverage End Date:07/21/2025;. Authorization Expiration Date: July 21, 2025.

## 2024-07-31 ENCOUNTER — Encounter: Payer: Self-pay | Admitting: Family Medicine

## 2024-07-31 NOTE — Progress Notes (Deleted)
 NEUROLOGY FOLLOW UP OFFICE NOTE  Whitney Aguirre [id number]  Assessment/Plan:   Episodic tension-type headache, not intractable Task-specific dystonia      1  Headache prevention:  duloxetine  30mg  daily  2  Headache rescue:  Excedrin first line, Aleve second line.  She is not at risk for rebound headache, so I told her she may go ahead and treat at earliest onset of headache. 3  Limit use of pain relievers to no more than 9 days out of the month to prevent risk of rebound or medication-overuse headache. 4  Keep headache diary 5  Lifestyle modification 6  Monitor dystonia for now.  Currently manageable 7  Follow up ***  Subjective:  Whitney Aguirre is a 33 year old right-handed female with Hashimoto's thyroiditis and PCOS and task specific dystonia who follows up for headache and task-specific dystonia.  UPDATE: Continues to do well on duloxetine  *** She also has started using a mouth guard and bottom retainer *** Intensity:  5/10  Duration:  2 hours with Excedrin or Aleve (first and second line), often avoids taking something and will last a few hours.   Frequency:  4 to 5 a month, triggered by the lights at work.  Current NSAIDS/analgesics:  Excedrin Migrane/Tension, Aleve Current triptans:  none Current ergotamine:  none Current anti-emetic:  Zofran  8mg  Current muscle relaxants:  Flexeril  10mg  at bedtime (sciatic/back pain) Current Antihypertensive medications:  metoprolol  tartrate Current Antidepressant medications:  duloxetine  30mg  daily Current Anticonvulsant medications:  none Current anti-CGRP:  none Current Vitamins/Herbal/Supplements:  none Current Antihistamines/Decongestants:  none Other therapy:  none Hormone/birth control:  Loestrin Fe Other medications:  levothyroxine , metformin   Caffeine :  1 20 oz cup coffee daily.  Sometimes soda Alcohol:  occasional Smoker:  no Diet:  48 to 64 oz water daily.  Does not skip meals Exercise:  not  routine Depression/Anxiety:  anxiety Other pain:  no Sleep hygiene:  poor.  no more than 5-6 hours a night.  Stress.  Cannot shut of brain.    HISTORY:  Tension-type headache: Onset:  headaches since she was 33 years old. Location:  usually forehead, top or back of head at base of neck Quality:  usually pressure, rarely throbbing vs pounding vs stabbling Intensity:  5-6/10.  She denies new headache, thunderclap headache or severe headache that wakes her from sleep. Aura:  absent Prodrome:  absent Associated symptoms:  No photophobia, phonophobia.  Rarely nausea.  She denies associated vomiting, visual disturbance, unilateral numbness or weakness. Duration:  Usually 30 to 60 minutes but may last up to 5-6 hours Frequency:  daily until starting nortriptyline .  Started nortriptyline  on 11/03/2021.  Now approximately 1 to 2 days a week.   Frequency of abortive medication: 1 to 2 days a week. Triggers:  stress, lights Relieving factors:  Excedrin Activity:  able to function  Isolated headache: It happened in 2024, a left sided pressure/stabbing headache while driving that lasted just 5 minutes.  No nausea, vomiting, photophobia, phonophobia or visual disturbance.  No neck pain at that time.  No recurrence.   Task-specific dystonia: She started noticing a tremor in her hands in 2022.  Happens at work when she is using her hands, such as at the computer or writing.  She feels the tremor start in hand and she needs to stop and rest until it passes.  Thumb starts to twitch and intrinsic hand muscles spasm.  Hand is in a flexed position.  Happens in both hands but mostly  the left hand.  Lasts 1 to 4 minutes.  Needs to stop and let it pass.  No pain, weakness or numbness.  Occurs 1 to 2 times a week.  Denies family history of tremor.  Thyroid  has been controlled.        Past NSAIDS/analgesics:  ibuprofen , naproxen, acetaminophen  Past abortive triptans:  none Past abortive ergotamine:  none Past  muscle relaxants:  none Past anti-emetic:  none Past antihypertensive medications:  none Past antidepressant medications:  amitriptyline , nortriptyline  (lethargy) Past anticonvulsant medications:  topiramate  Past anti-CGRP:  none Past vitamins/Herbal/Supplements:  none Past antihistamines/decongestants:  Flonase , Sudafed Other past therapies:  none    Family history of headache:  mom (severe migraines)  PAST MEDICAL HISTORY: Past Medical History:  Diagnosis Date   ACL tear 05/12/2014   injured on trampoline   Anemia    Anxiety    Depression    Dysrhythmia    GERD (gastroesophageal reflux disease)    Hashimoto's disease 2018   Headache(784.0)    stress   Heartburn    Hypothyroid    Palpitations    Polycystic ovarian syndrome    no current med.   Thyroid  disease     MEDICATIONS: Current Outpatient Medications on File Prior to Visit  Medication Sig Dispense Refill   docusate sodium  (COLACE) 100 MG capsule Take 1 capsule (100 mg total) by mouth 2 (two) times daily. Okay to decrease to once daily or stop taking if having loose bowel movements 30 capsule 0   DULoxetine  (CYMBALTA ) 30 MG capsule TAKE 1 CAPSULE BY MOUTH DAILY 30 capsule 0   famotidine  (PEPCID ) 40 MG tablet Take 1 tablet (40 mg total) by mouth at bedtime. 30 tablet 1   levothyroxine  (SYNTHROID ) 150 MCG tablet Take 150-300 mcg by mouth See admin instructions. Take 300 mcg Monday- Saturday twice a day, Take 150 mcg once a day on Sunday     meloxicam  (MOBIC ) 15 MG tablet Take 1 tablet (15 mg total) by mouth daily as needed for pain. 30 tablet 1   metFORMIN  (GLUCOPHAGE -XR) 500 MG 24 hr tablet Take 1 tablet (500 mg total) by mouth daily with breakfast. (Patient taking differently: Take 500 mg by mouth every evening.) 30 tablet 0   methocarbamol  (ROBAXIN ) 500 MG tablet Take 1 tablet (500 mg total) by mouth every 8 (eight) hours as needed for muscle spasms. 45 tablet 0   metoprolol  tartrate (LOPRESSOR ) 25 MG tablet Take  0.5 tablets (12.5 mg total) by mouth 2 (two) times daily as needed (for palpitations). 30 tablet 4   norethindrone-ethinyl estradiol -FE (LOESTRIN FE) 1-20 MG-MCG tablet Take 1 tablet by mouth every evening.     ondansetron  (ZOFRAN -ODT) 8 MG disintegrating tablet Take 1 tablet (8 mg total) by mouth every 8 (eight) hours as needed. 20 tablet 1   Vitamin D , Ergocalciferol , (DRISDOL ) 1.25 MG (50000 UNIT) CAPS capsule Take 1 capsule (50,000 Units total) by mouth every 7 (seven) days. (Patient taking differently: Take 50,000 Units by mouth every Monday.) 5 capsule 0   No current facility-administered medications on file prior to visit.    ALLERGIES: No Known Allergies  FAMILY HISTORY: Family History  Problem Relation Age of Onset   Hyperlipidemia Mother    Hypertension Mother    Cardiomyopathy Mother    High blood pressure Mother    Emphysema Mother    Heart disease Mother    Sudden death Mother    Kidney disease Mother    Obesity Mother    Ulcers  Father    Alcoholism Father    Drug abuse Father    Cancer Paternal Grandfather    Diabetes Other    Hypertension Other    Liver disease Neg Hx    Colon cancer Neg Hx    Esophageal cancer Neg Hx       Objective:  *** General: No acute distress.  Patient appears well-groomed.   ***    Juliene Dunnings, DO  CC: Betty [place], MD

## 2024-08-01 ENCOUNTER — Encounter: Payer: Self-pay | Admitting: Neurology

## 2024-08-01 ENCOUNTER — Ambulatory Visit: Admitting: Neurology

## 2024-08-06 ENCOUNTER — Encounter: Payer: Self-pay | Admitting: Neurology

## 2024-08-10 NOTE — Progress Notes (Signed)
 Chief Complaint  Patient presents with   Annual Exam    Discussed the use of AI scribe software for clinical note transcription with the patient, who gave verbal consent to proceed.  History of Present Illness Whitney Aguirre is a 33 year old female with past medical history significant for depression, chronic headaches, vitamin D  deficiency, and metabolic syndrome who presents for an annual physical exam. Last CPE over a year ago.  She underwent a cholecystectomy on July 08, 2024, and has not resumed exercising since. Post-surgery, she experiences diarrhea with certain foods, particularly greasy ones, and manages this by moderating her diet.  She follows a diet from the weight loss clinic and has lost weight. Her last visit to the weight loss clinic was at the end of July, and she is scheduled to return next Monday.  She does not consume alcohol and has never smoked.   Her last menstrual period started on Wednesday or Thursday of last week.   She regularly visits a gynecologist, an eye provider, and a dentist.   Health Maintenance  Topic Date Due   COVID-19 Vaccine (1 - 2024-25 season) 08/27/2024*   Pap with HPV screening  11/10/2024*   Flu Shot  02/24/2025*   Hepatitis B Vaccine (1 of 3 - 19+ 3-dose series) 08/11/2025*   HPV Vaccine (1 - 3-dose SCDM series) 08/11/2025*   Hepatitis C Screening  08/11/2025*   DTaP/Tdap/Td vaccine (2 - Td or Tdap) 04/02/2028   HIV Screening  Completed   Pneumococcal Vaccine  Aged Out   Meningitis B Vaccine  Aged Out  *Topic was postponed. The date shown is not the original due date.   Immunization History  Administered Date(s) Administered   Influenza, Quadrivalent, Recombinant, Inj, Pf 08/15/2023   Influenza-Unspecified 08/07/2016, 08/20/2017, 08/07/2019   Tdap 04/02/2018   Her mood has improved since her last visit, with family issues being better, although work remains stressful.   Chronic headache: She follows with neurologist,  currently on duloxetine  30 mg daily, which has helped. Hypothyroidism on levothyroxine .  Follows with endocrinologist, Dr. Tommas.  Hyperlipidemia on nonpharmacologic treatment. Lab Results  Component Value Date   CHOL 142 09/04/2023   HDL 34 (L) 09/04/2023   LDLCALC 68 09/04/2023   TRIG 246 (H) 09/04/2023   CHOLHDL 4.2 09/04/2023   Lab Results  Component Value Date   HGBA1C 5.3 09/04/2023   -Vitamin D  deficiency: She is on ergocalciferol  50,000 units weekly, which has been prescribed through [facility].  Lab Results  Component Value Date   VD25OH 23.0 (L) 05/01/2024   Chronic back pain: Takes meloxicam  15 mg daily as needed and methocarbamol  500 mg 3 times daily as needed.  She has not taken this medication for a few months. Lab Results  Component Value Date   NA 141 06/20/2024   CL 105 06/20/2024   K 4.3 06/20/2024   CO2 31 06/20/2024   BUN 9 06/20/2024   CREATININE 0.78 06/20/2024   GFR 101.66 06/20/2024   CALCIUM 9.7 06/20/2024   ALBUMIN 4.6 06/20/2024   GLUCOSE 95 06/20/2024   Lab Results  Component Value Date   ALT 20 06/20/2024   AST 14 06/20/2024   ALKPHOS 66 06/20/2024   BILITOT 0.5 06/20/2024   She has no concerns today.  Review of Systems  Constitutional:  Negative for activity change, appetite change and fever.  HENT:  Negative for mouth sores, sore throat and trouble swallowing.   Eyes:  Negative  for redness and visual disturbance.  Respiratory:  Negative for cough, shortness of breath and wheezing.   Cardiovascular:  Negative for chest pain and leg swelling.  Gastrointestinal:  Negative for abdominal pain, nausea and vomiting.  Endocrine: Negative for cold intolerance, heat intolerance, polydipsia, polyphagia and polyuria.  Genitourinary:  Negative for decreased urine volume, dysuria and hematuria.  Musculoskeletal:  Negative for gait problem and myalgias.  Skin:  Negative for color change and rash.  Allergic/Immunologic:  Negative for environmental allergies.  Neurological:  Negative for syncope, weakness and headaches.  Hematological:  Negative for adenopathy. Does not bruise/bleed easily.  Psychiatric/Behavioral:  Negative for confusion. The patient is not nervous/anxious.   All other systems reviewed and are negative.  Current Outpatient Medications on File Prior to Visit  Medication Sig Dispense Refill   DULoxetine  (CYMBALTA ) 30 MG capsule TAKE 1 CAPSULE BY MOUTH DAILY 30 capsule 0   famotidine  (PEPCID ) 40 MG tablet Take 1 tablet (40 mg total) by mouth at bedtime. 30 tablet 1   levothyroxine  (SYNTHROID ) 150 MCG tablet Take 150-300 mcg by mouth See admin instructions. Take 300 mcg Monday- Saturday twice a day, Take 150 mcg once a day on Sunday     meloxicam  (MOBIC ) 15 MG tablet Take 1 tablet (15 mg total) by mouth daily as needed for pain. 30 tablet 1   metFORMIN  (GLUCOPHAGE -XR) 500 MG 24 hr tablet Take 1 tablet (500 mg total) by mouth daily with breakfast. 30 tablet 0   methocarbamol  (ROBAXIN ) 500 MG tablet Take 1 tablet (500 mg total) by mouth every 8 (eight) hours as needed for muscle spasms. 45 tablet 0   metoprolol  tartrate (LOPRESSOR ) 25 MG tablet Take 0.5 tablets (12.5 mg total) by mouth 2 (two) times daily as needed (for palpitations). 30 tablet 4   norethindrone-ethinyl estradiol -FE (LOESTRIN FE) 1-20 MG-MCG tablet Take 1 tablet by mouth every evening.     ondansetron  (ZOFRAN -ODT) 8 MG disintegrating tablet Take 1 tablet (8 mg total) by mouth every 8 (eight) hours as needed. 20 tablet 1   Vitamin D , Ergocalciferol , (DRISDOL ) 1.25 MG (50000 UNIT) CAPS capsule Take 1 capsule (50,000 Units total) by mouth every 7 (seven) days. (Patient taking differently: Take 50,000 Units by mouth every Monday.) 5 capsule 0   No current facility-administered medications on file prior to visit.   Past Medical History:  Diagnosis Date   ACL tear 05/12/2014   injured on trampoline   Anemia    Anxiety    Depression     Dysrhythmia    GERD (gastroesophageal reflux disease)    Hashimoto's disease 2018   Headache(784.0)    stress   Heartburn    Hypothyroid    Palpitations    Polycystic ovarian syndrome    no current med.   Thyroid  disease    Past Surgical History:  Procedure Laterality Date   CHOLECYSTECTOMY N/A 07/08/2024   Procedure: LAPAROSCOPIC CHOLECYSTECTOMY;  Surgeon: Signe Mitzie LABOR, MD;  Location: [facility];  Service: General;  Laterality: N/A;  ICG   KNEE ARTHROSCOPY WITH ANTERIOR CRUCIATE LIGAMENT (ACL) REPAIR Left 06/05/2014   Procedure: LEFT KNEE ARTHROSCOPY WITH ANTERIOR CRUCIATE LIGAMENT (ACL) REPAIR;  Surgeon: Norleen LITTIE Gavel, MD;  Location: [facility];  Service: Orthopedics;  Laterality: Left;   WISDOM TOOTH EXTRACTION     Allergies  Allergen Reactions   Chlorhexidine  Hives   Family History  Problem Relation Age of Onset   Hyperlipidemia Mother    Hypertension Mother    Cardiomyopathy Mother  High blood pressure Mother    Emphysema Mother    Heart disease Mother    Sudden death Mother    Kidney disease Mother    Obesity Mother    Ulcers Father    Alcoholism Father    Drug abuse Father    Cancer Paternal Grandfather    Diabetes Other    Hypertension Other    Liver disease Neg Hx    Colon cancer Neg Hx    Esophageal cancer Neg Hx     Social History   Socioeconomic History   Marital status: Married    Spouse name: Not on file   Number of children: 0   Years of education: College/RN   Highest education level: GED or equivalent  Occupational History    Employer: [employer]    Comment: ED-RN  Tobacco Use   Smoking status: Never   Smokeless tobacco: Never  Vaping Use   Vaping status: Never Used  Substance and Sexual Activity   Alcohol use: Not Currently    Comment: rare   Drug use: No   Sexual activity: Yes  Other Topics Concern   Not on file  Social History Narrative   Patient lives at home with family.   Caffeine  Use:1-2 cups   Right  handed   Social Drivers of Health   Financial Resource Strain: Low Risk  (08/11/2024)   Overall Financial Resource Strain (CARDIA)    Difficulty of Paying Living Expenses: Not very hard  Food Insecurity: No Food Insecurity (08/11/2024)   Hunger Vital Sign    Worried About Running Out of Food in the Last Year: Never true    Ran Out of Food in the Last Year: Never true  Transportation Needs: No Transportation Needs (08/11/2024)   PRAPARE - Administrator, Civil Service (Medical): No    Lack of Transportation (Non-Medical): No  Physical Activity: Inactive (08/11/2024)   Exercise Vital Sign    Days of Exercise per Week: 0 days    Minutes of Exercise per Session: Not on file  Stress: No Stress Concern Present (08/11/2024)   [name] of Occupational Health - Occupational Stress Questionnaire    Feeling of Stress: Not at all  Social Connections: Socially Integrated (08/11/2024)   Social Connection and Isolation Panel    Frequency of Communication with Friends and Family: Three times a week    Frequency of Social Gatherings with Friends and Family: Twice a week    Attends Religious Services: More than 4 times per year    Active Member of Clubs or Organizations: Yes    Attends Banker Meetings: 1 to 4 times per year    Marital Status: Married   Vitals:   08/11/24 0711  BP: 118/82  Pulse: 74  Resp: 12  Temp: 97.8 F (36.6 C)  SpO2: 98%   Body mass index is 38.78 kg/m.  Wt Readings from Last 3 Encounters:  08/11/24 247 lb 9.6 oz (112.3 kg)  07/08/24 249 lb (112.9 kg)  07/04/24 249 lb (112.9 kg)   Physical Exam Vitals and nursing note reviewed.  Constitutional:      General: She is not in acute distress.    Appearance: She is well-developed.  HENT:     Head: Normocephalic and atraumatic.     Right Ear: Tympanic membrane, ear canal and external ear normal.     Left Ear: Tympanic membrane, ear canal and external ear normal.     Mouth/Throat:  Mouth: Mucous membranes are moist.     Pharynx: Oropharynx is clear. Uvula midline.  Eyes:     Extraocular Movements: Extraocular movements intact.     Conjunctiva/sclera: Conjunctivae normal.     Pupils: Pupils are equal, round, and reactive to light.  Neck:     Thyroid : No thyroid  mass or thyromegaly.  Cardiovascular:     Rate and Rhythm: Normal rate and regular rhythm.     Pulses:          Dorsalis pedis pulses are 2+ on the right side and 2+ on the left side.     Heart sounds: No murmur heard. Pulmonary:     Effort: Pulmonary effort is normal. No respiratory distress.     Breath sounds: Normal breath sounds.  Abdominal:     Palpations: Abdomen is soft. There is no hepatomegaly or mass.     Tenderness: There is no abdominal tenderness.  Genitourinary:    Comments: Deferred to gyn. Musculoskeletal:     Comments: No major deformity or signs of synovitis appreciated.  Lymphadenopathy:     Cervical: No cervical adenopathy.     Upper Body:     Right upper body: No supraclavicular adenopathy.     Left upper body: No supraclavicular adenopathy.  Skin:    General: Skin is warm.     Findings: No erythema or rash.  Neurological:     General: No focal deficit present.     Mental Status: She is alert and oriented to person, place, and time.     Cranial Nerves: No cranial nerve deficit.     Coordination: Coordination normal.     Gait: Gait normal.     Deep Tendon Reflexes:     Reflex Scores:      Bicep reflexes are 2+ on the right side and 2+ on the left side.      Patellar reflexes are 2+ on the right side and 2+ on the left side. Psychiatric:        Mood and Affect: Mood and affect normal.    ASSESSMENT AND PLAN: Ms. Khamora Karan was here today annual physical examination.  Orders Placed This Encounter  Procedures   Hemoglobin A1c   Lipid panel   VITAMIN D  25 Hydroxy (Vit-D Deficiency, Fractures)   Hepatitis C antibody   Lab Results  Component Value Date   CHOL  118 08/11/2024   HDL 32.30 (L) 08/11/2024   LDLCALC 50 08/11/2024   TRIG 177.0 (H) 08/11/2024   CHOLHDL 4 08/11/2024   Lab Results  Component Value Date   VD25OH 33.06 08/11/2024   Lab Results  Component Value Date   HGBA1C 5.8 08/11/2024   Routine general medical examination at a health care facility Assessment & Plan: We discussed the importance of regular physical activity and healthy diet for prevention of chronic illness and/or complications. Preventive guidelines reviewed. Vaccination up-to-date.  Planning on getting her through work. Continue following with gynecologist for her female preventive care. Next CPE in a year.   Screening for endocrine, metabolic and immunity disorder -     Hemoglobin A1c; Future  Encounter for HCV screening test for low risk patient -     Hepatitis C antibody; Future  Vitamin D  deficiency, unspecified Assessment & Plan: Currently on ergocalciferol  50,000 units weekly. Problem is being followed at [facility]. 25 OH vitamin D  ordered today.  Orders: -     VITAMIN D  25 Hydroxy (Vit-D Deficiency, Fractures); Future  Hyperlipidemia,  unspecified hyperlipidemia type Assessment & Plan: Low HDL and hypertriglyceridemia. Currently on nonpharmacologic treatment. Further recommendation will be given according to lipid panel result.  Orders: -     Lipid panel; Future   Return in 1 year (on 08/11/2025) for CPE.  Bea Duren G. [place], MD  [facility]. [place] office.

## 2024-08-11 ENCOUNTER — Ambulatory Visit: Payer: Self-pay | Admitting: Family Medicine

## 2024-08-11 ENCOUNTER — Ambulatory Visit (INDEPENDENT_AMBULATORY_CARE_PROVIDER_SITE_OTHER): Admitting: Family Medicine

## 2024-08-11 VITALS — BP 118/82 | HR 74 | Temp 97.8°F | Resp 12 | Ht 67.0 in | Wt 247.6 lb

## 2024-08-11 DIAGNOSIS — E785 Hyperlipidemia, unspecified: Secondary | ICD-10-CM | POA: Insufficient documentation

## 2024-08-11 DIAGNOSIS — Z13228 Encounter for screening for other metabolic disorders: Secondary | ICD-10-CM

## 2024-08-11 DIAGNOSIS — E559 Vitamin D deficiency, unspecified: Secondary | ICD-10-CM | POA: Diagnosis not present

## 2024-08-11 DIAGNOSIS — Z130 Encounter for screening for diseases of the blood and blood-forming organs and certain disorders involving the immune mechanism: Secondary | ICD-10-CM | POA: Diagnosis not present

## 2024-08-11 DIAGNOSIS — Z0000 Encounter for general adult medical examination without abnormal findings: Secondary | ICD-10-CM

## 2024-08-11 DIAGNOSIS — Z1159 Encounter for screening for other viral diseases: Secondary | ICD-10-CM

## 2024-08-11 DIAGNOSIS — Z1329 Encounter for screening for other suspected endocrine disorder: Secondary | ICD-10-CM | POA: Diagnosis not present

## 2024-08-11 LAB — LIPID PANEL
Cholesterol: 118 mg/dL (ref 0–200)
HDL: 32.3 mg/dL — ABNORMAL LOW (ref >39.00)
LDL Cholesterol: 50 mg/dL (ref 0–99)
NonHDL: 85.47
Total CHOL/HDL Ratio: 4
Triglycerides: 177 mg/dL — ABNORMAL HIGH (ref 0.0–149.0)
VLDL: 35.4 mg/dL (ref 0.0–40.0)

## 2024-08-11 LAB — VITAMIN D 25 HYDROXY (VIT D DEFICIENCY, FRACTURES): VITD: 33.06 ng/mL (ref 30.00–100.00)

## 2024-08-11 LAB — HEMOGLOBIN A1C: Hgb A1c MFr Bld: 5.8 % (ref 4.6–6.5)

## 2024-08-11 NOTE — Assessment & Plan Note (Signed)
 We discussed the importance of regular physical activity and healthy diet for prevention of chronic illness and/or complications. Preventive guidelines reviewed. Vaccination up-to-date.  Planning on getting her through work. Continue following with gynecologist for her female preventive care. Next CPE in a year.

## 2024-08-11 NOTE — Patient Instructions (Addendum)
 A few things to remember from today's visit:  Routine general medical examination at a health care facility  Screening for endocrine, metabolic and immunity disorder - Plan: Hemoglobin A1c  Encounter for HCV screening test for low risk patient - Plan: Hepatitis C antibody  Vitamin D  deficiency - Plan: VITAMIN D  25 Hydroxy (Vit-D Deficiency, Fractures)  Hyperlipidemia, unspecified hyperlipidemia type - Plan: Lipid panel  If you need refills for medications you take chronically, please call your pharmacy. Do not use My Chart to request refills or for acute issues that need immediate attention. If you send a my chart message, it may take a few days to be addressed, specially if I am not in the office.  Please be sure medication list is accurate. If a new problem present, please set up appointment sooner than planned today.  Health Maintenance, Female Adopting a healthy lifestyle and getting preventive care are important in promoting health and wellness. Ask your health care provider about: The right schedule for you to have regular tests and exams. Things you can do on your own to prevent diseases and keep yourself healthy. What should I know about diet, weight, and exercise? Eat a healthy diet  Eat a diet that includes plenty of vegetables, fruits, low-fat dairy products, and lean protein. Do not eat a lot of foods that are high in solid fats, added sugars, or sodium. Maintain a healthy weight Body mass index (BMI) is used to identify weight problems. It estimates body fat based on height and weight. Your health care provider can help determine your BMI and help you achieve or maintain a healthy weight. Get regular exercise Get regular exercise. This is one of the most important things you can do for your health. Most adults should: Exercise for at least 150 minutes each week. The exercise should increase your heart rate and make you sweat (moderate-intensity exercise). Do  strengthening exercises at least twice a week. This is in addition to the moderate-intensity exercise. Spend less time sitting. Even light physical activity can be beneficial. Watch cholesterol and blood lipids Have your blood tested for lipids and cholesterol at 31 years of age, then have this test every 5 years. Have your cholesterol levels checked more often if: Your lipid or cholesterol levels are high. You are older than 31 years of age. You are at high risk for heart disease. What should I know about cancer screening? Depending on your health history and family history, you may need to have cancer screening at various ages. This may include screening for: Breast cancer. Cervical cancer. Colorectal cancer. Skin cancer. Lung cancer. What should I know about heart disease, diabetes, and high blood pressure? Blood pressure and heart disease High blood pressure causes heart disease and increases the risk of stroke. This is more likely to develop in people who have high blood pressure readings or are overweight. Have your blood pressure checked: Every 3-5 years if you are 41-60 years of age. Every year if you are 110 years old or older. Diabetes Have regular diabetes screenings. This checks your fasting blood sugar level. Have the screening done: Once every three years after age 24 if you are at a normal weight and have a low risk for diabetes. More often and at a younger age if you are overweight or have a high risk for diabetes. What should I know about preventing infection? Hepatitis B If you have a higher risk for hepatitis B, you should be screened for this virus. Talk with  your health care provider to find out if you are at risk for hepatitis B infection. Hepatitis C Testing is recommended for: Everyone born from [birth year] through 1963. Anyone with known risk factors for hepatitis C. Sexually transmitted infections (STIs) Get screened for STIs, including gonorrhea and chlamydia,  if: You are sexually active and are younger than 31 years of age. You are older than 31 years of age and your health care provider tells you that you are at risk for this type of infection. Your sexual activity has changed since you were last screened, and you are at increased risk for chlamydia or gonorrhea. Ask your health care provider if you are at risk. Ask your health care provider about whether you are at high risk for HIV. Your health care provider may recommend a prescription medicine to help prevent HIV infection. If you choose to take medicine to prevent HIV, you should first get tested for HIV. You should then be tested every 3 months for as long as you are taking the medicine. Pregnancy If you are about to stop having your period (premenopausal) and you may become pregnant, seek counseling before you get pregnant. Take 400 to 800 micrograms (mcg) of folic acid  every day if you become pregnant. Ask for birth control (contraception) if you want to prevent pregnancy. Osteoporosis and menopause Osteoporosis is a disease in which the bones lose minerals and strength with aging. This can result in bone fractures. If you are 92 years old or older, or if you are at risk for osteoporosis and fractures, ask your health care provider if you should: Be screened for bone loss. Take a calcium or vitamin D  supplement to lower your risk of fractures. Be given hormone replacement therapy (HRT) to treat symptoms of menopause. Follow these instructions at home: Alcohol use Do not drink alcohol if: Your health care provider tells you not to drink. You are pregnant, may be pregnant, or are planning to become pregnant. If you drink alcohol: Limit how much you have to: 0-1 drink a day. Know how much alcohol is in your drink. In the [place] one drink equals one 12 oz bottle of beer (355 mL), one 5 oz glass of wine (148 mL), or one 1 oz glass of hard liquor (44 mL). Lifestyle Do not use any products that  contain nicotine or tobacco. These products include cigarettes, chewing tobacco, and vaping devices, such as e-cigarettes. If you need help quitting, ask your health care provider. Do not use street drugs. Do not share needles. Ask your health care provider for help if you need support or information about quitting drugs. General instructions Schedule regular health, dental, and eye exams. Stay current with your vaccines. Tell your health care provider if: You often feel depressed. You have ever been abused or do not feel safe at home. Summary Adopting a healthy lifestyle and getting preventive care are important in promoting health and wellness. Follow your health care provider's instructions about healthy diet, exercising, and getting tested or screened for diseases. Follow your health care provider's instructions on monitoring your cholesterol and blood pressure. This information is not intended to replace advice given to you by your health care provider. Make sure you discuss any questions you have with your health care provider. Document Revised: 04/04/2021 Document Reviewed: 04/04/2021 Elsevier Patient Education  2024 [employer].

## 2024-08-11 NOTE — Assessment & Plan Note (Signed)
 Low HDL and hypertriglyceridemia. Currently on nonpharmacologic treatment. Further recommendation will be given according to lipid panel result.

## 2024-08-11 NOTE — Assessment & Plan Note (Signed)
 Currently on ergocalciferol  50,000 units weekly. [facility]. 25 OH vitamin D  ordered today.

## 2024-08-12 LAB — HEPATITIS C ANTIBODY: Hepatitis C Ab: NONREACTIVE

## 2024-08-17 NOTE — Progress Notes (Deleted)
 [name], PA-C [address] [place], [place]  [address] Phone: [phone]   Primary Care Physician: [place], Betty G, MD  Primary Gastroenterologist:  [name], PA-C / Glendia Holt, MD   Chief Complaint:  Follow-Up Upper Abdominal Pain       HPI:   Whitney Aguirre is a 33 y.o. female returns for 71-month follow-up of upper abdominal pain, and chronic nausea.  Symptoms started March 2025 after she started Zepbound .  This medicine was discontinued 05/28/2024.  History of GERD.  She Tried Protonix  which did not help, and switched to famotidine  40mg  QHS which worked better.  Takes Zofran  as needed.  No previous EGD or colonoscopy.  06/20/2024 labs showed normal CBC, CMP, and lipase.  H. pylori stool test was ordered, but not completed.  06/25/2024 complete abdominal ultrasound: Cholelithiasis without cholecystitis.  Hepatic steatosis.  She underwent laparoscopic cholecystectomy 07/08/2024.  Gallbladder pathology showed mild chronic cholecystitis, cholesterolosis, and cholelithiasis.  Current symptoms:  Current Outpatient Medications  Medication Sig Dispense Refill   DULoxetine  (CYMBALTA ) 30 MG capsule TAKE 1 CAPSULE BY MOUTH DAILY 30 capsule 0   famotidine  (PEPCID ) 40 MG tablet Take 1 tablet (40 mg total) by mouth at bedtime. 30 tablet 1   levothyroxine  (SYNTHROID ) 150 MCG tablet Take 150-300 mcg by mouth See admin instructions. Take 300 mcg Monday- Saturday twice a day, Take 150 mcg once a day on Sunday     meloxicam  (MOBIC ) 15 MG tablet Take 1 tablet (15 mg total) by mouth daily as needed for pain. 30 tablet 1   metFORMIN  (GLUCOPHAGE -XR) 500 MG 24 hr tablet Take 1 tablet (500 mg total) by mouth daily with breakfast. 30 tablet 0   methocarbamol  (ROBAXIN ) 500 MG tablet Take 1 tablet (500 mg total) by mouth every 8 (eight) hours as needed for muscle spasms. 45 tablet 0   metoprolol  tartrate (LOPRESSOR ) 25 MG tablet Take 0.5 tablets (12.5 mg total) by mouth 2 (two)  times daily as needed (for palpitations). 30 tablet 4   norethindrone-ethinyl estradiol -FE (LOESTRIN FE) 1-20 MG-MCG tablet Take 1 tablet by mouth every evening.     ondansetron  (ZOFRAN -ODT) 8 MG disintegrating tablet Take 1 tablet (8 mg total) by mouth every 8 (eight) hours as needed. 20 tablet 1   Vitamin D , Ergocalciferol , (DRISDOL ) 1.25 MG (50000 UNIT) CAPS capsule Take 1 capsule (50,000 Units total) by mouth every 7 (seven) days. (Patient taking differently: Take 50,000 Units by mouth every Monday.) 5 capsule 0   No current facility-administered medications for this visit.    Allergies as of 08/18/2024 - Review Complete 08/11/2024  Allergen Reaction Noted   Chlorhexidine  Hives 08/01/2024    Past Medical History:  Diagnosis Date   ACL tear 05/12/2014   injured on trampoline   Anemia    Anxiety    Depression    Dysrhythmia    GERD (gastroesophageal reflux disease)    Hashimoto's disease 2018   Headache(784.0)    stress   Heartburn    Hypothyroid    Palpitations    Polycystic ovarian syndrome    no current med.   Thyroid  disease     Past Surgical History:  Procedure Laterality Date   CHOLECYSTECTOMY N/A 07/08/2024   Procedure: LAPAROSCOPIC CHOLECYSTECTOMY;  Surgeon: Signe Mitzie LABOR, MD;  Location: [facility];  Service: General;  Laterality: N/A;  ICG   KNEE ARTHROSCOPY WITH ANTERIOR CRUCIATE LIGAMENT (ACL) REPAIR Left 06/05/2014   Procedure: LEFT KNEE ARTHROSCOPY WITH ANTERIOR CRUCIATE  LIGAMENT (ACL) REPAIR;  Surgeon: Norleen LITTIE Gavel, MD;  Location: [facility];  Service: Orthopedics;  Laterality: Left;   WISDOM TOOTH EXTRACTION      Review of Systems:    All systems reviewed and negative except where noted in HPI.    Physical Exam:  LMP 08/07/2024 (Approximate)  Patient's last menstrual period was 08/07/2024 (approximate).  General: Well-nourished, well-developed in no acute distress.  Lungs: Clear to auscultation bilaterally. Non-labored. Heart: Regular  rate and rhythm, no murmurs rubs or gallops.  Abdomen: Bowel sounds are normal; Abdomen is Soft; No hepatosplenomegaly, masses or hernias;  No Abdominal Tenderness; No guarding or rebound tenderness. Neuro: Alert and oriented x 3.  Grossly intact.  Psych: Alert and cooperative, normal mood and affect.   Imaging Studies: No results found.  Labs: CBC    Component Value Date/Time   WBC 7.4 06/20/2024 1423   RBC 5.14 (H) 06/20/2024 1423   HGB 14.7 06/20/2024 1423   HGB 13.6 05/01/2024 0756   HCT 44.2 06/20/2024 1423   HCT 42.2 05/01/2024 0756   PLT 301.0 06/20/2024 1423   PLT 279 05/01/2024 0756   MCV 86.0 06/20/2024 1423   MCV 87 05/01/2024 0756   MCH 28.2 05/01/2024 0756   MCH 28.9 07/02/2020 1114   MCHC 33.2 06/20/2024 1423   RDW 13.9 06/20/2024 1423   RDW 12.3 05/01/2024 0756   LYMPHSABS 3.1 06/20/2024 1423   LYMPHSABS 2.6 09/04/2023 0842   MONOABS 0.6 06/20/2024 1423   EOSABS 0.2 06/20/2024 1423   EOSABS 0.2 09/04/2023 0842   BASOSABS 0.0 06/20/2024 1423   BASOSABS 0.0 09/04/2023 0842    CMP     Component Value Date/Time   NA 141 06/20/2024 1423   NA 144 05/01/2024 0756   K 4.3 06/20/2024 1423   CL 105 06/20/2024 1423   CO2 31 06/20/2024 1423   GLUCOSE 95 06/20/2024 1423   BUN 9 06/20/2024 1423   BUN 10 05/01/2024 0756   CREATININE 0.78 06/20/2024 1423   CREATININE 0.89 07/02/2020 1114   CALCIUM 9.7 06/20/2024 1423   PROT 7.5 06/20/2024 1423   PROT 6.0 05/01/2024 0756   ALBUMIN 4.6 06/20/2024 1423   ALBUMIN 3.9 (L) 05/01/2024 0756   AST 14 06/20/2024 1423   ALT 20 06/20/2024 1423   ALKPHOS 66 06/20/2024 1423   BILITOT 0.5 06/20/2024 1423   BILITOT <0.2 05/01/2024 0756   GFRNONAA >90 12/07/2014 1729   GFRAA >90 12/07/2014 1729       Assessment and Plan:   Whitney Aguirre is a 33 y.o. y/o female returns for 37-month follow-up of upper abdominal pain and nausea.  She underwent cholecystectomy for cholelithiasis 07/08/2024.  Has history of GERD and  hepatic steatosis.  Was on Zepbound , which was thought to contribute to GI symptoms, and was discontinued.  Currently on famotidine  for GERD.  CBC, CMP, and lipase labs normal.  H. pylori stool test not completed.  No previous EGD.  Currently feeling better.  1.  Upper abdominal pain greatly improved s/p cholecystectomy 07/08/2024.  **If persistent upper abdominal pain or nausea, then schedule EGD.  2.  Mild GERD - Continue famotidine  40 mg daily - Recommend Lifestyle Modifications to prevent Acid Reflux.  Rec. Avoid coffee, sodas, peppermint, garlic, onions, alcohol, citrus fruits, chocolate, tomatoes, fatty and spicey foods.  Avoid eating 2-3 hours before bedtime.    3.  Hepatic steatosis Recommend a low-fat diet, regular exercise, and weight loss. Patient education handout about fatty liver  disease was given and discussed.   [name], PA-C  Follow up ***

## 2024-08-18 ENCOUNTER — Ambulatory Visit: Admitting: Physician Assistant

## 2024-08-18 ENCOUNTER — Encounter (HOSPITAL_COMMUNITY): Payer: Self-pay | Admitting: Emergency Medicine

## 2024-08-18 ENCOUNTER — Emergency Department (HOSPITAL_COMMUNITY)
Admission: EM | Admit: 2024-08-18 | Discharge: 2024-08-19 | Disposition: A | Discharge status: Home / Self Care | Attending: Emergency Medicine | Admitting: Emergency Medicine

## 2024-08-18 ENCOUNTER — Emergency Department (HOSPITAL_COMMUNITY)

## 2024-08-18 ENCOUNTER — Ambulatory Visit (INDEPENDENT_AMBULATORY_CARE_PROVIDER_SITE_OTHER): Admitting: Family Medicine

## 2024-08-18 ENCOUNTER — Other Ambulatory Visit: Payer: Self-pay

## 2024-08-18 ENCOUNTER — Encounter: Payer: Self-pay | Admitting: Family Medicine

## 2024-08-18 VITALS — BP 129/83 | HR 68 | Temp 97.5°F | Ht 67.0 in | Wt 239.0 lb

## 2024-08-18 DIAGNOSIS — R002 Palpitations: Secondary | ICD-10-CM | POA: Diagnosis not present

## 2024-08-18 DIAGNOSIS — E559 Vitamin D deficiency, unspecified: Secondary | ICD-10-CM

## 2024-08-18 DIAGNOSIS — Z9189 Other specified personal risk factors, not elsewhere classified: Secondary | ICD-10-CM

## 2024-08-18 DIAGNOSIS — R110 Nausea: Secondary | ICD-10-CM | POA: Diagnosis not present

## 2024-08-18 DIAGNOSIS — R12 Heartburn: Secondary | ICD-10-CM | POA: Diagnosis not present

## 2024-08-18 DIAGNOSIS — R946 Abnormal results of thyroid function studies: Secondary | ICD-10-CM | POA: Insufficient documentation

## 2024-08-18 DIAGNOSIS — E66812 Obesity, class 2: Secondary | ICD-10-CM

## 2024-08-18 DIAGNOSIS — R61 Generalized hyperhidrosis: Secondary | ICD-10-CM | POA: Diagnosis not present

## 2024-08-18 DIAGNOSIS — Z6837 Body mass index (BMI) 37.0-37.9, adult: Secondary | ICD-10-CM

## 2024-08-18 DIAGNOSIS — E039 Hypothyroidism, unspecified: Secondary | ICD-10-CM | POA: Diagnosis not present

## 2024-08-18 DIAGNOSIS — E6609 Other obesity due to excess calories: Secondary | ICD-10-CM

## 2024-08-18 DIAGNOSIS — R079 Chest pain, unspecified: Secondary | ICD-10-CM | POA: Diagnosis not present

## 2024-08-18 DIAGNOSIS — E282 Polycystic ovarian syndrome: Secondary | ICD-10-CM | POA: Diagnosis not present

## 2024-08-18 DIAGNOSIS — R7989 Other specified abnormal findings of blood chemistry: Secondary | ICD-10-CM

## 2024-08-18 DIAGNOSIS — R000 Tachycardia, unspecified: Secondary | ICD-10-CM | POA: Diagnosis not present

## 2024-08-18 DIAGNOSIS — Z7289 Other problems related to lifestyle: Secondary | ICD-10-CM

## 2024-08-18 DIAGNOSIS — R55 Syncope and collapse: Secondary | ICD-10-CM | POA: Diagnosis not present

## 2024-08-18 LAB — CBC
HCT: 42.1 % (ref 36.0–46.0)
Hemoglobin: 14.3 g/dL (ref 12.0–15.0)
MCH: 29.4 pg (ref 26.0–34.0)
MCHC: 34 g/dL (ref 30.0–36.0)
MCV: 86.4 fL (ref 80.0–100.0)
Platelets: 293 K/uL (ref 150–400)
RBC: 4.87 MIL/uL (ref 3.87–5.11)
RDW: 12.3 % (ref 11.5–15.5)
WBC: 6.8 K/uL (ref 4.0–10.5)
nRBC: 0 % (ref 0.0–0.2)

## 2024-08-18 LAB — BASIC METABOLIC PANEL WITH GFR
Anion gap: 10 (ref 5–15)
BUN: 9 mg/dL (ref 6–20)
CO2: 23 mmol/L (ref 22–32)
Calcium: 9.1 mg/dL (ref 8.9–10.3)
Chloride: 105 mmol/L (ref 98–111)
Creatinine, Ser: 0.57 mg/dL (ref 0.44–1.00)
GFR, Estimated: >60 mL/min (ref >60)
Glucose, Bld: 102 mg/dL — ABNORMAL HIGH (ref 70–99)
Potassium: 4.1 mmol/L (ref 3.5–5.1)
Sodium: 138 mmol/L (ref 135–145)

## 2024-08-18 LAB — HCG, SERUM, QUALITATIVE: Preg, Serum: NEGATIVE

## 2024-08-18 LAB — D-DIMER, QUANTITATIVE: D-Dimer, Quant: 0.38 ug{FEU}/mL (ref 0.00–0.50)

## 2024-08-18 LAB — TROPONIN I (HIGH SENSITIVITY): Troponin I (High Sensitivity): 2 ng/L (ref <18)

## 2024-08-18 MED ORDER — PANTOPRAZOLE SODIUM 40 MG PO TBEC: 40.0000 mg | DELAYED_RELEASE_TABLET | Freq: Every day | ORAL | 0 refills | Status: DC

## 2024-08-18 MED ORDER — ZEPBOUND 5 MG/0.5ML ~~LOC~~ SOAJ: 5.0000 mg | SUBCUTANEOUS | 0 refills | Status: DC

## 2024-08-18 MED ORDER — METFORMIN HCL ER 500 MG PO TB24: 500.0000 mg | ORAL_TABLET | Freq: Every day | ORAL | 0 refills | Status: DC

## 2024-08-18 MED ORDER — ONDANSETRON 8 MG PO TBDP: 8.0000 mg | ORAL_TABLET | Freq: Three times a day (TID) | ORAL | 1 refills | Status: DC | PRN

## 2024-08-18 MED ORDER — VITAMIN D (ERGOCALCIFEROL) 1.25 MG (50000 UNIT) PO CAPS: 50000.0000 [IU] | ORAL_CAPSULE | ORAL | 0 refills | Status: DC

## 2024-08-18 NOTE — ED Triage Notes (Signed)
 First Nurse Note: Pt had sudden onset of CP, ShOB, palpitations that started while she was at dinner. Symptoms subsided some initially, then returned. Pt notes her HR was ranged from 110-140s.

## 2024-08-18 NOTE — Patient Instructions (Signed)
 Resume Zepbound  5 mg weekly injection Use Zofran  as needed for nausea  Be sure to hydrate well with water Avoid high fat/ greasy/ fried foods and sweets  Trade out Famotidine  for Pantoprazole  at bedtime for GERD-- see Dr  Stacia back  Add back in pilates and plan to start walking 30 min at lunch daily  Add in a fiber supplement and 1-2 fresh fruit servings daily  Work on getting in dinner around 7:00 Fill 1/2 plate with veggies Get in a lean protein and a small starch serving

## 2024-08-18 NOTE — Progress Notes (Signed)
 Office: [phone]  /  Fax: [phone]  WEIGHT SUMMARY AND BIOMETRICS  Starting Date: 09/04/23  Starting Weight: 256lb   Weight Lost Since Last Visit: 10lb   Vitals Temp: (!) 97.5 F (36.4 C) BP: 129/83 Pulse Rate: 68 SpO2: 97 %   Body Composition  Body Fat %: 42.9 % Fat Mass (lbs): 102.8 lbs Muscle Mass (lbs): 130 lbs Total Body Water (lbs): 90 lbs Visceral Fat Rating : 9    HPI  Chief Complaint: OBESITY  Whitney Aguirre is here to discuss her progress with her obesity treatment plan. She is on the the Category 3 Plan and states she is following her eating plan approximately 0 % of the time. She states she is exercising 0 minutes 0 times per week.  Interval History:  Since last office visit she is down 10 lb She is down 7.4 lb of muscle mass and down 2.2 lb of body fat This gives her a net weight loss of 17 lb in the past 11 mos of medically supervised weight management This is a 6.6 % TBW loss She is down from her max weight of 280 lb She is feeling better since having her gall bladder removed She has been off of Zepbound  since 7/2, was on 5 mg dose and doing well She had previously had nausea from Wegovy  She has had to cut out greasy/ fried foods post cholecystectomy She is not hungry in the AM, often having a protein shake Has some dyspepsia in the AM despite taking famotidine  Dinner has been later Husband is fairly supportive Doing no regular exercise  Pharmacotherapy: none  PHYSICAL EXAM:  Blood pressure 129/83, pulse 68, temperature (!) 97.5 F (36.4 C), height 5' 7 (1.702 m), weight 239 lb (108.4 kg), last menstrual period 08/07/2024, SpO2 97%. Body mass index is 37.43 kg/m.  General: She is overweight, cooperative, alert, well developed, and in no acute distress. PSYCH: Has normal mood, affect and thought process.   Lungs: Normal breathing effort, no conversational dyspnea.   ASSESSMENT AND PLAN  TREATMENT PLAN FOR OBESITY:  Recommended  Dietary Goals  Rohini is currently in the action stage of change. As such, her goal is to continue weight management plan. She has agreed to the Category 3 Plan.  Behavioral Intervention  We discussed the following Behavioral Modification Strategies today: increasing lean protein intake to established goals, increasing fiber rich foods, increasing water intake , work on meal planning and preparation, keeping healthy foods at home, identifying sources and decreasing liquid calories, practice mindfulness eating and understand the difference between hunger signals and cravings, work on managing stress, creating time for self-care and relaxation, avoiding temptations and identifying enticing environmental cues, continue to practice mindfulness when eating, and planning for success.  Additional resources provided today: NA  Recommended Physical Activity Goals  Taneisha has been advised to work up to 150 minutes of moderate intensity aerobic activity a week and strengthening exercises 2-3 times per week for cardiovascular health, weight loss maintenance and preservation of muscle mass.   She has agreed to Start aerobic activity with a goal of 150 minutes a week at moderate intensity.   Pharmacotherapy changes for the treatment of obesity: resume Zepbound  at 5 mg weekly injection  ASSOCIATED CONDITIONS ADDRESSED TODAY  Heartburn Worsening with famotidine  40 mg at night Avoids high acid foods but has been eating dinner later at night Avoid eating >2 hrs prior to bed.  Change famotidine  to Pantoprazole  40 mg at bedtime and keep upcoming visit with  GI. -     Pantoprazole  Sodium; Take 1 tablet (40 mg total) by mouth daily.  Dispense: 90 tablet; Refill: 0  Vitamin D  deficiency Last vitamin D  Lab Results  Component Value Date   VD25OH 33.06 08/11/2024  Improving.  Her vitamin D  level is improving on RX vitamin D  weekly, continue Repeat lab in 3-4 mos  -     Vitamin D  (Ergocalciferol ); Take 1  capsule (50,000 Units total) by mouth every 7 (seven) days.  Dispense: 12 capsule; Refill: 0  PCOS (polycystic ovarian syndrome) With associated IR, taking Metformin  XR 500 mg daily, continue We discussed the importance of dietary compliance and increasing walking time -     metFORMIN  HCl ER; Take 1 tablet (500 mg total) by mouth daily with breakfast.  Dispense: 30 tablet; Refill: 0  Nausea Previously had some nausea with Zepbound  injection, did well with prn Zofran  use, refilled -     Ondansetron ; Take 1 tablet (8 mg total) by mouth every 8 (eight) hours as needed.  Dispense: 20 tablet; Refill: 1  Class 2 obesity due to excess calories with body mass index (BMI) of 37.0 to 37.9 in adult, unspecified whether serious comorbidity present -     Zepbound ; Inject 5 mg into the skin once a week.  Dispense: 2 mL; Refill: 0 Resume Zepbound  at 5 mg weekly while keeping kcal intake ~1500 per day  Sedentary lifestyle Stable She has a sedentary job with little motivation to exercise at the end of the day Recommend adding in a 30 min walk over lunch break and eating lunch in place of working thru lunch. She is agreeable.     She was informed of the importance of frequent follow up visits to maximize her success with intensive lifestyle modifications for her multiple health conditions.   ATTESTASTION STATEMENTS:  Reviewed by clinician on day of visit: allergies, medications, problem list, medical history, surgical history, family history, social history, and previous encounter notes pertinent to obesity diagnosis.   I have personally spent 30 minutes total time today in preparation, patient care, nutritional counseling and education,  and documentation for this visit, including the following: review of most recent clinical lab tests, prescribing medications/ refilling medications, reviewing medical assistant documentation, review and interpretation of bioimpedence results.     Darice Haddock,  D.O. DABFM, DABOM [facility] [address] [place], [place] [address] [phone]

## 2024-08-18 NOTE — ED Triage Notes (Signed)
 Patient c/o tachycardia x 1.5 hours as well as chest pain and sob.  Patient denies history of same.  Patient does see a cardiologist for PVCs.

## 2024-08-19 LAB — TROPONIN I (HIGH SENSITIVITY): Troponin I (High Sensitivity): 3 ng/L (ref <18)

## 2024-08-19 LAB — TSH: TSH: <0.1 u[IU]/mL — ABNORMAL LOW (ref 0.350–4.500)

## 2024-08-19 LAB — T4, FREE: Free T4: 1.52 ng/dL — ABNORMAL HIGH (ref 0.61–1.12)

## 2024-08-19 NOTE — ED Notes (Signed)
 Assuming pt care, pt walk in for sob, tachycardia x 1 day, aaox4, ambulatory, nsr, 99 on RA. Family at bedside

## 2024-08-19 NOTE — ED Notes (Signed)
 Called pt out in lobby no answer and I called pt mobile phone also no answer.

## 2024-08-19 NOTE — Discharge Instructions (Signed)
 You were seen today for an elevated heart rate.  Your TSH was elevated.  T4 is pending.  Check MyChart for results and follow-up with your primary doctor as you may need adjustment in your thyroid  medication.  You were also referred to cardiology for Holter monitoring.  If you have any new or worsening symptoms including worsening palpitations, shortness of breath, chest pain, you should be reevaluated.

## 2024-08-19 NOTE — ED Provider Notes (Signed)
 [place] EMERGENCY DEPARTMENT AT [facility] Provider Note   CSN: [id number] Arrival date & time: 08/18/24  2202     Patient presents with: Tachycardia   Whitney Aguirre is a 33 y.o. female.   HPI     This is a 33 year old female who presents with concerns for palpitations and tachycardia.  Patient with history of hypothyroidism on supplementation.  Reports that she was at dinner tonight when she began to have palpitations.  She experienced shortness of breath and chest pain associated with this.  States that her heart rate was in the 140s based on her watch.  No history of arrhythmia.  No recent changes in medications.  Denies increase in caffeine  or stimulant use.  States that her heart rate stayed in the 140s for approximately 30 minutes.  She then got up and walked to her car and it recurred.  Currently she states she feels somewhat better.  Reports she has been on stable thyroid  medication.  Prior to Admission medications   Medication Sig Start Date End Date Taking? Authorizing Provider  DULoxetine  (CYMBALTA ) 30 MG capsule TAKE 1 CAPSULE BY MOUTH DAILY 07/08/24   Skeet, Adam R, DO  levothyroxine  (SYNTHROID ) 150 MCG tablet Take 150-300 mcg by mouth See admin instructions. Take 300 mcg Monday- Saturday twice a day, Take 150 mcg once a day on Sunday    [provider]  meloxicam  (MOBIC ) 15 MG tablet Take 1 tablet (15 mg total) by mouth daily as needed for pain. 04/07/24   [place], Betty G, MD  metFORMIN  (GLUCOPHAGE -XR) 500 MG 24 hr tablet Take 1 tablet (500 mg total) by mouth daily with breakfast. 08/18/24   Bowen, Darice BRAVO, DO  methocarbamol  (ROBAXIN ) 500 MG tablet Take 1 tablet (500 mg total) by mouth every 8 (eight) hours as needed for muscle spasms. 04/07/24   [place], Betty G, MD  metoprolol  tartrate (LOPRESSOR ) 25 MG tablet Take 0.5 tablets (12.5 mg total) by mouth 2 (two) times daily as needed (for palpitations). 05/25/22 08/11/24  Croitoru, Mihai, MD   norethindrone-ethinyl estradiol -FE (LOESTRIN FE) 1-20 MG-MCG tablet Take 1 tablet by mouth every evening. 05/28/23   [provider]  ondansetron  (ZOFRAN -ODT) 8 MG disintegrating tablet Take 1 tablet (8 mg total) by mouth every 8 (eight) hours as needed. 08/18/24   Bowen, Darice BRAVO, DO  pantoprazole  (PROTONIX ) 40 MG tablet Take 1 tablet (40 mg total) by mouth daily. 08/18/24   Bowen, Darice BRAVO, DO  tirzepatide  (ZEPBOUND ) 5 MG/0.5ML Pen Inject 5 mg into the skin once a week. 08/18/24   Bowen, Darice BRAVO, DO  Vitamin D , Ergocalciferol , (DRISDOL ) 1.25 MG (50000 UNIT) CAPS capsule Take 1 capsule (50,000 Units total) by mouth every 7 (seven) days. 08/18/24   Bowen, Darice BRAVO, DO    Allergies: Chlorhexidine     Review of Systems  Constitutional:  Negative for fever.  Respiratory:  Positive for shortness of breath.   Cardiovascular:  Positive for chest pain and palpitations.  All other systems reviewed and are negative.   Updated Vital Signs BP 113/71 (BP Location: Right Arm)   Pulse 87   Temp 98.3 F (36.8 C) (Oral)   Resp 18   Ht 1.702 m (5' 7)   Wt 108.4 kg   LMP 08/07/2024 (Approximate)   SpO2 99%   BMI 37.43 kg/m   Physical Exam Vitals and nursing note reviewed.  Constitutional:      Appearance: She is well-developed. She is obese. She is not ill-appearing.  HENT:     Head: Normocephalic and atraumatic.  Eyes:     Pupils: Pupils are equal, round, and reactive to light.  Cardiovascular:     Rate and Rhythm: Regular rhythm. Tachycardia present.     Heart sounds: Normal heart sounds.  Pulmonary:     Effort: Pulmonary effort is normal. No respiratory distress.     Breath sounds: No wheezing.  Abdominal:     General: Bowel sounds are normal.     Palpations: Abdomen is soft.  Musculoskeletal:     Cervical back: Neck supple.  Skin:    General: Skin is warm and dry.  Neurological:     Mental Status: She is alert and oriented to person, place, and time.  Psychiatric:        Mood  and Affect: Mood normal.     (all labs ordered are listed, but only abnormal results are displayed) Labs Reviewed  BASIC METABOLIC PANEL WITH GFR - Abnormal; Notable for the following components:      Result Value   Glucose, Bld 102 (*)    All other components within normal limits  TSH - Abnormal; Notable for the following components:   TSH <0.100 (*)    All other components within normal limits  CBC  HCG, SERUM, QUALITATIVE  D-DIMER, QUANTITATIVE  T4, FREE  TROPONIN I (HIGH SENSITIVITY)  TROPONIN I (HIGH SENSITIVITY)    EKG: EKG Interpretation Date/Time:  Monday August 18 2024 22:10:55 EDT Ventricular Rate:  105 PR Interval:  156 QRS Duration:  86 QT Interval:  326 QTC Calculation: 430 R Axis:   70  Text Interpretation: Sinus tachycardia Otherwise normal ECG When compared with ECG of 18-Jun-2023 08:19, PREVIOUS ECG IS PRESENT Confirmed by Bari Pfeiffer ([id number]) on 08/19/2024 12:47:33 AM  Radiology: [facility] Chest 2 View Result Date: 08/18/2024 CLINICAL DATA:  Chest pain, diaphoresis, near syncope, tachycardia. EXAM: CHEST - 2 VIEW COMPARISON:  08/29/2016 FINDINGS: The heart size and mediastinal contours are within normal limits. Both lungs are clear. The visualized skeletal structures are unremarkable. IMPRESSION: No active cardiopulmonary disease. Electronically Signed   By: Elsie Gravely M.D.   On: 08/18/2024 22:51     Procedures   Medications Ordered in the ED - No data to display  Clinical Course as of 08/19/24 0157  Tue Aug 19, 2024  0144 TSH elevated.  Most recent T4 back in February was normal.  Added T4 to workup.  Patient is awake and alert.  No evidence of thyroid  storm at this time although suspect she may have some element of hyperthyroidism contributing to palpitations.  Will call her in the morning with T4 results and have her follow-up closely with her primary doctor to adjust her thyroid  medication.  We discussed the benefits of potentially being on a  brand-name thyroid  medication because of a smaller therapeutic window.  Additionally, we will refer to cardiology for possible Holter monitoring.  In the meantime she will make sure to avoid any triggering medications or substances. [CH]    Clinical Course User Index [CH] Indya Oliveria, Pfeiffer FALCON, MD                                 Medical Decision Making Amount and/or Complexity of Data Reviewed Labs: ordered. Radiology: ordered.   This patient presents to the ED for concern of palpitations, this involves an extensive number of treatment options, and is a complaint that carries with it  a high risk of complications and morbidity.  I considered the following differential and admission for this acute, potentially life threatening condition.  The differential diagnosis includes arrhythmia, thyroid  issues, PE, ACS  MDM:    This is a 33 year old female who presents with tachycardia and palpitations.  Nontoxic.  She has mild tachycardia on exam which appears to be sinus.  Labs obtained.  No significant metabolic derangement.  D-dimer is negative which makes PE less likely.  Troponin x 2 reassuring.  Chest x-ray without pneumothorax or pneumonia.  TSH did come back at less than 0.1.  She has been on a stable dose of thyroid  medication and her last T4 earlier this year was normal.  Repeat T4 was sent.  She has been hemodynamically stable and her pulse is now 87.  Do not feel she needs to wait for free T4 but did discuss with her that she may need to have adjustments of her thyroid  medication once this results.  Additionally we discussed modifying factors including caffeine  or stimulant intake.  Cardiology referral was made for Holter monitoring.  Patient is agreeable to plan.  (Labs, imaging, consults)  Labs: I Ordered, and personally interpreted labs.  The pertinent results include: CBC, BMP, troponin x 2, TSH  Imaging Studies ordered: I ordered imaging studies including chest x-ray I independently  visualized and interpreted imaging. I agree with the radiologist interpretation  Additional history obtained from chart review.  External records from outside source obtained and reviewed including basic labs  Cardiac Monitoring: The patient was not maintained on a cardiac monitor.  If on the cardiac monitor, I personally viewed and interpreted the cardiac monitored which showed an underlying rhythm of: N/A  Reevaluation: After the interventions noted above, I reevaluated the patient and found that they have :improved  Social Determinants of Health:  lives independently  Disposition: Discharge  Co morbidities that complicate the patient evaluation  Past Medical History:  Diagnosis Date   ACL tear 05/12/2014   injured on trampoline   Anemia    Anxiety    Depression    Dysrhythmia    GERD (gastroesophageal reflux disease)    Hashimoto's disease 2018   Headache(784.0)    stress   Heartburn    Hypothyroid    Palpitations    Polycystic ovarian syndrome    no current med.   Thyroid  disease      Medicines No orders of the defined types were placed in this encounter.   I have reviewed the patients home medicines and have made adjustments as needed  Problem List / ED Course: Problem List Items Addressed This Visit       Other   Palpitations - Primary   Relevant Orders   Ambulatory referral to Cardiology   Other Visit Diagnoses       Tachycardia       Relevant Orders   Ambulatory referral to Cardiology     Elevated TSH                    Final diagnoses:  Palpitations  Tachycardia  Elevated TSH    ED Discharge Orders          Ordered    Ambulatory referral to Cardiology        08/19/24 0157               Bari Charmaine FALCON, MD 08/19/24 0200

## 2024-08-20 ENCOUNTER — Ambulatory Visit: Attending: Physician Assistant | Admitting: Physician Assistant

## 2024-08-20 ENCOUNTER — Encounter: Payer: Self-pay | Admitting: Physician Assistant

## 2024-08-20 ENCOUNTER — Ambulatory Visit (INDEPENDENT_AMBULATORY_CARE_PROVIDER_SITE_OTHER)

## 2024-08-20 VITALS — BP 104/70 | HR 84 | Ht 67.0 in | Wt 245.4 lb

## 2024-08-20 DIAGNOSIS — I4719 Other supraventricular tachycardia: Secondary | ICD-10-CM

## 2024-08-20 DIAGNOSIS — R002 Palpitations: Secondary | ICD-10-CM

## 2024-08-20 DIAGNOSIS — E038 Other specified hypothyroidism: Secondary | ICD-10-CM

## 2024-08-20 NOTE — Progress Notes (Unsigned)
 Applied a 14 day Zio XT monitor to patient in the office  Croitoru to read

## 2024-08-20 NOTE — Progress Notes (Unsigned)
  Cardiology Office Note   Date:  08/20/2024  ID:  Whitney Aguirre, DOB [birth year], MRN [id number] PCP: [place], Betty G, MD  [facility] Providers Cardiologist:  Jerel Balding, MD { Click to update primary MD,subspecialty MD or APP then REFRESH:1}    History of Present Illness Whitney Aguirre is a 33 y.o. female with past medical history of morbid obesity, polycystic ovarian syndrome, treated autoimmune hypothyroidism, impaired glucose tolerance without diabetes, anxiety/depression, family history of hypertrophic cardiomyopathy (mother has a defibrillator and used to see Dr. Fernande), frequent PVCs and palpitation.  Echocardiogram performed in 2013, 2018 and 2023 showed normal EF.  Her mother has never received appropriate treatment for VT from her ICD nor has she had a cardiac arrest.  Her mother did not have genetic testing due to cost.  She was last seen by Dr. Balding in July 2024 due to monomorphic PVCs that was felt to be adrenergic lead driven.  Symptom controlled on as needed beta-blocker. She underwent laparoscopic cholecystectomy procedure in August 2025.  She was seen in the emergency room yesterday for palpitation and tachycardia.  She was that the dinner when she started having palpitation.  Heart rate went up to 140s on her watch.  She denies any increased caffeine  intake or any stimulant usage.  Lab work obtained in the ED was reassuring with normal renal function and electrolyte.  Pregnancy test negative.  Normal CBC.  D-dimer normal.  Troponin negative x 2.  TSH severely suppressed at less than 0.1.  Free T4 elevated at 1.52.  Patient presents today for evaluation of palpitation.  So far she had 2 episodes of prolonged palpitation that lasted longer than 10 minutes.  She was able to capture with 1 episode her Apple watch, it appears to be atrial tachycardia with heart rate in the 130s.  However I am unable to rule out possibility of atrial flutter.  Interestingly, when she  did had this symptom, she describes sudden onset and sudden offset of the palpitation, which make me think she went out of rhythm.  She does not drink alcohol and does not use any stimulant.  She tried to cut back on caffeine  intake.  I suspect her recent episode is more related to taking too much levothyroxine .  She is scheduled to see her endocrinologist in 2 weeks. I recommended echocardiogram and 2-week ZIO monitor.  ROS: ***  Studies Reviewed      *** Risk Assessment/Calculations {Does this patient have ATRIAL FIBRILLATION?:[phone]}         Physical Exam VS:  BP 104/70   Pulse 84   Ht 5' 7 (1.702 m)   Wt 245 lb 6.4 oz (111.3 kg)   LMP 08/07/2024 (Approximate)   SpO2 98%   BMI 38.44 kg/m        Wt Readings from Last 3 Encounters:  08/20/24 245 lb 6.4 oz (111.3 kg)  08/19/24 238 lb 15.7 oz (108.4 kg)  08/18/24 239 lb (108.4 kg)    GEN: Well nourished, well developed in no acute distress NECK: No JVD; No carotid bruits CARDIAC: ***RRR, no murmurs, rubs, gallops RESPIRATORY:  Clear to auscultation without rales, wheezing or rhonchi  ABDOMEN: Soft, non-tender, non-distended EXTREMITIES:  No edema; No deformity   ASSESSMENT AND PLAN ***    {Are you ordering a CV Procedure (e.g. stress test, cath, DCCV, TEE, etc)?   Press F2        :[id number]}  Dispo: ***  Signed, Scot Ford, PA

## 2024-08-20 NOTE — Patient Instructions (Addendum)
 Thank you for choosing [facility]!     Medication Instructions:  No medication changes were made during today's visit.  *If you need a refill on your cardiac medications before your next appointment, please call your pharmacy*   Lab Work: No labs were ordered during today's visit.  If you have labs (blood work) drawn today and your tests are completely normal, you will receive your results only by: MyChart Message (if you have MyChart) OR A paper copy in the mail If you have any lab test that is abnormal or we need to change your treatment, we will call you to review the results.   Testing/Procedures: Your physician has requested that you have an echocardiogram. Echocardiography is a painless test that uses sound waves to create images of your heart. It provides your doctor with information about the size and shape of your heart and how well your heart's chambers and valves are working. This procedure takes approximately one hour. There are no restrictions for this procedure. Please do NOT wear cologne, perfume, aftershave, or lotions (deodorant is allowed). Please arrive 15 minutes prior to your appointment time.  Please note: We ask at that you not bring children with you during ultrasound (echo/ vascular) testing. Due to room size and safety concerns, children are not allowed in the ultrasound rooms during exams. Our front office staff cannot provide observation of children in our lobby area while testing is being conducted. An adult accompanying a patient to their appointment will only be allowed in the ultrasound room at the discretion of the ultrasound technician under special circumstances. We apologize for any inconvenience.      ZIO XT- Long Term Monitor Instructions  Your physician has requested you wear your ZIO patch monitor___14____days.   This is a single patch monitor.  [employer] supplies one patch monitor per enrollment.  Additional stickers are not available.    Please do not apply patch if you will be having a Nuclear Stress Test, Echocardiogram, Cardiac CT, MRI, or Chest Xray during the time frame you would be wearing the monitor. The patch cannot be worn during these tests.  You cannot remove and re-apply the ZIO XT patch monitor.   Your ZIO patch monitor will be sent USPS Priority mail from [facility] directly to your home address. The monitor may also be mailed to a PO BOX if home delivery is not available.   It may take 3-5 days to receive your monitor after you have been enrolled.   Once you have received you monitor, please review enclosed instructions.  Your monitor has already been registered assigning a specific monitor serial # to you.   Applying the monitor   Shave hair from upper left chest.   Hold abrader disc by orange tab.  Rub abrader in 40 strokes over left upper chest as indicated in your monitor instructions.   Clean area with 4 enclosed alcohol pads .  Use all pads to assure are is cleaned thoroughly.  Let dry.   Apply patch as indicated in monitor instructions.  Patch will be place under collarbone on left side of chest with arrow pointing upward.   Rub patch adhesive wings for 2 minutes.Remove white label marked 1.  Remove white label marked 2.  Rub patch adhesive wings for 2 additional minutes.   While looking in a mirror, press and release button in center of patch.  A small green light will flash 3-4 times .  This will be your only indicator  the monitor has been turned on.     Do not shower for the first 24 hours.  You may shower after the first 24 hours.   Press button if you feel a symptom. You will hear a small click.  Record Date, Time and Symptom in the Patient Log Book.   When you are ready to remove patch, follow instructions on last 2 pages of Patient Log Book.  Stick patch monitor onto last page of Patient Log Book.   Place Patient Log Book in [place] box.  Use locking tab on box and tape box closed  securely.  The [employer] and [employer] has [employer] on it.  Please place in mailbox as soon as possible.  Your physician should have your test results approximately 7 days after the monitor has been mailed back to [facility].   Call [facility] Customer Care at [phone] if you have questions regarding your ZIO XT patch monitor.  Call them immediately if you see an orange light blinking on your monitor.   If your monitor falls off in less than 4 days contact our Monitor department at [phone].  If your monitor becomes loose or falls off after 4 days call [employer] at [phone] for suggestions on securing your monitor.    Your next appointment:   6 week(s)   Provider:    Scot Ford, PA-C      or Dr. Francyne   Follow-Up: At [facility], you and your health needs are our priority.  As part of our continuing mission to provide you with exceptional heart care, we have created designated Provider Care Teams.  These Care Teams include your primary Cardiologist (physician) and Advanced Practice Providers (APPs -  Physician Assistants and Nurse Practitioners) who all work together to provide you with the care you need, when you need it. We recommend signing up for the patient portal called MyChart.  Sign up information is provided on this After Visit Summary.  MyChart is used to connect with patients for Virtual Visits (Telemedicine).  Patients are able to view lab/test results, encounter notes, upcoming appointments, etc.  Non-urgent messages can be sent to your provider as well.   To learn more about what you can do with MyChart, go to [email or web address].

## 2024-08-21 ENCOUNTER — Ambulatory Visit: Admitting: Nurse Practitioner

## 2024-08-26 DIAGNOSIS — R7301 Impaired fasting glucose: Secondary | ICD-10-CM | POA: Diagnosis not present

## 2024-08-26 DIAGNOSIS — E039 Hypothyroidism, unspecified: Secondary | ICD-10-CM | POA: Diagnosis not present

## 2024-08-26 DIAGNOSIS — E282 Polycystic ovarian syndrome: Secondary | ICD-10-CM | POA: Diagnosis not present

## 2024-09-02 DIAGNOSIS — E282 Polycystic ovarian syndrome: Secondary | ICD-10-CM | POA: Diagnosis not present

## 2024-09-02 DIAGNOSIS — E039 Hypothyroidism, unspecified: Secondary | ICD-10-CM | POA: Diagnosis not present

## 2024-09-02 DIAGNOSIS — E669 Obesity, unspecified: Secondary | ICD-10-CM | POA: Diagnosis not present

## 2024-09-02 DIAGNOSIS — R7301 Impaired fasting glucose: Secondary | ICD-10-CM | POA: Diagnosis not present

## 2024-09-17 ENCOUNTER — Ambulatory Visit: Payer: Self-pay | Admitting: Physician Assistant

## 2024-09-17 DIAGNOSIS — R002 Palpitations: Secondary | ICD-10-CM | POA: Diagnosis not present

## 2024-09-17 DIAGNOSIS — I4719 Other supraventricular tachycardia: Secondary | ICD-10-CM

## 2024-09-17 NOTE — Telephone Encounter (Signed)
 Pt verbally made aware via telephone and verbalized understanding.

## 2024-09-17 NOTE — Progress Notes (Signed)
 Reassuring heart monitor, no significant arrhythmia seen. 8 patient manually triggered events and 5 diary events, heart rate often in the 120s during the event, but underlying rhythm is normal rhythm. Patient did not go out of rhythm during these episodes

## 2024-09-23 ENCOUNTER — Ambulatory Visit: Admitting: Family Medicine

## 2024-09-23 ENCOUNTER — Encounter: Payer: Self-pay | Admitting: Family Medicine

## 2024-09-23 VITALS — BP 117/74 | HR 75 | Temp 97.8°F | Ht 67.0 in | Wt 237.0 lb

## 2024-09-23 DIAGNOSIS — Z6837 Body mass index (BMI) 37.0-37.9, adult: Secondary | ICD-10-CM

## 2024-09-23 DIAGNOSIS — R110 Nausea: Secondary | ICD-10-CM

## 2024-09-23 DIAGNOSIS — E88819 Insulin resistance, unspecified: Secondary | ICD-10-CM | POA: Diagnosis not present

## 2024-09-23 DIAGNOSIS — E559 Vitamin D deficiency, unspecified: Secondary | ICD-10-CM

## 2024-09-23 DIAGNOSIS — E6609 Other obesity due to excess calories: Secondary | ICD-10-CM

## 2024-09-23 DIAGNOSIS — K760 Fatty (change of) liver, not elsewhere classified: Secondary | ICD-10-CM

## 2024-09-23 DIAGNOSIS — E66812 Obesity, class 2: Secondary | ICD-10-CM

## 2024-09-23 MED ORDER — ZEPBOUND 7.5 MG/0.5ML ~~LOC~~ SOAJ: 7.5000 mg | SUBCUTANEOUS | 0 refills | Status: DC

## 2024-09-23 MED ORDER — VITAMIN D (ERGOCALCIFEROL) 1.25 MG (50000 UNIT) PO CAPS: 50000.0000 [IU] | ORAL_CAPSULE | ORAL | 0 refills | Status: DC

## 2024-09-23 MED ORDER — ONDANSETRON 8 MG PO TBDP: 8.0000 mg | ORAL_TABLET | Freq: Three times a day (TID) | ORAL | 1 refills | Status: AC | PRN

## 2024-09-23 NOTE — Progress Notes (Signed)
 Office: [phone]  /  Fax: [phone]  WEIGHT SUMMARY AND BIOMETRICS  Starting Date: 09/04/23  Starting Weight: 256lb   Weight Lost Since Last Visit: 2lb   Vitals Temp: 97.8 F (36.6 C) BP: 117/74 Pulse Rate: 75 SpO2: 98 %   Body Composition  Body Fat %: 42.4 % Fat Mass (lbs): 100.8 lbs Muscle Mass (lbs): 130 lbs Total Body Water (lbs): 90.4 lbs Visceral Fat Rating : 9     HPI  Chief Complaint: OBESITY  Whitney Aguirre is here to discuss her progress with her obesity treatment plan. She is on the the Category 3 Plan and states she is following her eating plan approximately 0 % of the time. She states she is exercising 30 minutes 2 times per week.   Interval History:  Since last office visit she is down 2 lb She is stable in lean body mass and down 2 lb of body fat since last visit She did have a visit to the ED for tachycardia/ palpitations related to hyperthyroidism  This gives her a net weight loss of 19 lb in 12 mos of medically supervised weight management This is a 7.4% TBW loss She has had some nausea and sour burps restarting Zepbound  She has added in some walking during  breaks at work She has had improvements with food noise and food volumes since restarting Zepbound  She is off cat 3 meal plan and has not been tracking  Pharmacotherapy: Zepbound  5 mg weekly + Metformin  XR 500 mg daily  PHYSICAL EXAM:  Blood pressure 117/74, pulse 75, temperature 97.8 F (36.6 C), height 5' 7 (1.702 m), weight 237 lb (107.5 kg), SpO2 98%. Body mass index is 37.12 kg/m.  General: She is overweight, cooperative, alert, well developed, and in no acute distress. PSYCH: Has normal mood, affect and thought process.   Lungs: Normal breathing effort, no conversational dyspnea.   ASSESSMENT AND PLAN  TREATMENT PLAN FOR OBESITY:  Recommended Dietary Goals  Whitney Aguirre is currently in the action stage of change. As such, her goal is to continue weight management plan. She  has agreed to keeping a food journal and adhering to recommended goals of 1500 calories and 90-110 g of  protein.  Behavioral Intervention  We discussed the following Behavioral Modification Strategies today: increasing lean protein intake to established goals, decreasing simple carbohydrates , increasing water intake , work on meal planning and preparation, work on counselling psychologist calories using tracking application, keeping healthy foods at home, avoiding temptations and identifying enticing environmental cues, continue to practice mindfulness when eating, and planning for success.  Additional resources provided today: NA  Recommended Physical Activity Goals  Whitney Aguirre has been advised to work up to 150 minutes of moderate intensity aerobic activity a week and strengthening exercises 2-3 times per week for cardiovascular health, weight loss maintenance and preservation of muscle mass.   She has agreed to Increase and monitor steps for a goal of 10,000 per day Start with step goal > 8,000 daily  Pharmacotherapy changes for the treatment of obesity: increase Zepbound  to 7.5 mg weekly  ASSOCIATED CONDITIONS ADDRESSED TODAY  Insulin  resistance Worsening Fasting insulin  updated with Dr Tommas from 08/26/24 very high at 115 She has associated PCOS and is on Metformin  XR 500 mg daily Has some abdominal cramping and loose stools, otherwise would benefit from a higher dose of metformin  but will hold off due to adverse SE Continue to limit carbs/ sugar intake, track calorie intake and increase daily step goal  Class 2 obesity due to excess calories with body mass index (BMI) of 37.0 to 37.9 in adult, unspecified whether serious comorbidity present She is down 7.4% TBW loss in 12 mos of medically supervised weight management Reviewed bioimpedence results with patient -     Zepbound ; Inject 7.5 mg into the skin once a week.  Dispense: 2 mL; Refill: 0 Vitamin D  deficiency -     Vitamin D   (Ergocalciferol ); Take 1 capsule (50,000 Units total) by mouth every 7 (seven) days.  Dispense: 12 capsule; Refill: 0 Last vitamin D  Lab Results  Component Value Date   VD25OH 33.06 08/11/2024  Continue RX vitamin D  weekly with a goal >50 Repeat lab in [name]  Nausea -     Ondansetron ; Take 1 tablet (8 mg total) by mouth every 8 (eight) hours as needed.  Dispense: 20 tablet; Refill: 1 Dyspepsia and nausea with Zepbound  restart, even with pantoprazole  40 mg daily Denies over eating, high fat/ high sugar food intake, late night eating or high acid foods Recommend taking a dose of Zofran  at the time of Zepbound  injection and the following morning Continue Pantoprazole , small amounts of food (lean protein/ fruits/ veggies) q 3-4 hrs, hydrating well with water/ sugar free fluids during the day to help with GI SE from Zepbound   Hepatic steatosis Fibrosis 4 Score = .33  Fib-4 interpretation is not validated for people under 35 or over 65 years of age. However, scores under 2.0 are generally considered low risk.          She was informed of the importance of frequent follow up visits to maximize her success with intensive lifestyle modifications for her multiple health conditions.   ATTESTASTION STATEMENTS:  Reviewed by clinician on day of visit: allergies, medications, problem list, medical history, surgical history, family history, social history, and previous encounter notes pertinent to obesity diagnosis.   I have personally spent 30 minutes total time today in preparation, patient care, nutritional counseling and education,  and documentation for this visit, including the following: review of most recent clinical lab tests, prescribing medications/ refilling medications, reviewing medical assistant documentation, review and interpretation of bioimpedence results.     Darice Haddock, D.O. DABFM, DABOM [facility] [address] [place], [place] [address] [phone]

## 2024-09-23 NOTE — Patient Instructions (Signed)
 Finish out Zepbound  5 mg injection then increase to 7.5 mg weekly  Take a zofran  at time of Zepbound  injection and then the next morning  Eat small portions every 3-4 hrs Remember lean protein/ fruits and veggies Sip water/ sugar free liquids throughout the day  Aim for > 8,000 steps/ day -- wear smart watch Great job getting in steps at work  Track calories on [employer] Diary Aim for 1500 cal/ day This should include 90-110 g of protein daily Avoid sweets and treats at this time of year

## 2024-09-25 ENCOUNTER — Ambulatory Visit (HOSPITAL_COMMUNITY)
Admission: RE | Admit: 2024-09-25 | Discharge: 2024-09-25 | Disposition: A | Discharge status: Home / Self Care | Source: Ambulatory Visit | Attending: Internal Medicine | Admitting: Internal Medicine

## 2024-09-25 DIAGNOSIS — I349 Nonrheumatic mitral valve disorder, unspecified: Secondary | ICD-10-CM

## 2024-09-25 DIAGNOSIS — R002 Palpitations: Secondary | ICD-10-CM | POA: Diagnosis not present

## 2024-09-25 DIAGNOSIS — I4719 Other supraventricular tachycardia: Secondary | ICD-10-CM | POA: Insufficient documentation

## 2024-09-25 LAB — ECHOCARDIOGRAM COMPLETE
Area-P 1/2: 3.72 cm2
S' Lateral: 2.7 cm

## 2024-09-25 NOTE — Progress Notes (Signed)
Patient has been notified directly; all questions, if any, were answered. Patient voiced understanding.   

## 2024-10-01 ENCOUNTER — Ambulatory Visit: Admitting: Physician Assistant

## 2024-10-08 NOTE — Progress Notes (Signed)
 Whitney Console, Whitney Aguirre [address] [place], [place]  [address] Phone: [phone]   Primary Care Physician: Jordan, Betty G, MD  Primary Gastroenterologist:  Whitney Console, Whitney Aguirre / Glendia Holt, MD  Chief Complaint: Follow-up upper abdominal pain, nausea, vomiting, hepatic steatosis.       HPI:   Discussed the use of AI scribe software for clinical note transcription with the patient, who gave verbal consent to proceed.  She returns for 3.72-month follow-up of upper abdominal pain, nausea, vomiting, and hepatic steatosis.    05/2024 CBC, CMP, lipase labs normal. 08/19/2024 labs: Negative serum pregnancy test.  06/25/2024 complete abdominal ultrasound:   1. Cholelithiasis without secondary signs of acute cholecystitis. 2. Increased hepatic parenchymal echogenicity suggestive of steatosis.  07/08/2024 laparoscopic cholecystectomy by Dr. Cameron.  Gallbladder pathology showed mild chronic cholecystitis, cholesterolosis, and cholelithiasis.  H. pylori stool test was ordered but not completed.  History of Present Illness She underwent gallbladder removal surgery after experiencing significant abdominal pain that began in March, following the initiation of Zepbound  in February. The surgery resolved her abdominal pain, and she describes herself as a 'different person' post-surgery.  She is feeling a lot better and has no more right upper quadrant abdominal pain.  She continues to have diarrhea, which she attributes to metformin  use. She takes metformin  500 mg twice daily and notes that increasing the dose exacerbates the diarrhea. Greasy or fried foods can worsen her symptoms. She is currently on Zepbound  5 mg.  She is followed at [facility] healthy weight management.  She experiences persistent nausea, primarily in the mornings, which she describes as sometimes severe enough to prevent her from going to work. She associates the nausea with the need to have a bowel movement. The nausea  has been present for a while, possibly before starting Zepbound , and occurs about five times a month.  Nausea has not improved postcholecystectomy.  She takes pantoprazole  at night for acid reflux.  No longer taking famotidine .  She has not completed H. pylori testing due to concerns about sample storage. She has not experienced any abdominal pain since her gallbladder surgery.     Current Outpatient Medications  Medication Sig Dispense Refill   DULoxetine  (CYMBALTA ) 30 MG capsule TAKE 1 CAPSULE BY MOUTH DAILY 30 capsule 0   levothyroxine  (SYNTHROID ) 150 MCG tablet Take 150-300 mcg by mouth See admin instructions. Take 300 mcg Monday- Saturday twice a day, Take 150 mcg once a day on Sunday     meloxicam  (MOBIC ) 15 MG tablet Take 1 tablet (15 mg total) by mouth daily as needed for pain. 30 tablet 1   metFORMIN  (GLUCOPHAGE -XR) 500 MG 24 hr tablet Take 1 tablet (500 mg total) by mouth daily with breakfast. 30 tablet 0   methocarbamol  (ROBAXIN ) 500 MG tablet Take 1 tablet (500 mg total) by mouth every 8 (eight) hours as needed for muscle spasms. 45 tablet 0   metoprolol  tartrate (LOPRESSOR ) 25 MG tablet Take 0.5 tablets (12.5 mg total) by mouth 2 (two) times daily as needed (for palpitations). 30 tablet 4   norethindrone-ethinyl estradiol -FE (LOESTRIN FE) 1-20 MG-MCG tablet Take 1 tablet by mouth every evening.     ondansetron  (ZOFRAN -ODT) 8 MG disintegrating tablet Take 1 tablet (8 mg total) by mouth every 8 (eight) hours as needed. 20 tablet 1   pantoprazole  (PROTONIX ) 40 MG tablet Take 1 tablet (40 mg total) by mouth daily. 90 tablet 0   tirzepatide  (ZEPBOUND ) 7.5 MG/0.5ML Pen Inject 7.5 mg  into the skin once a week. 2 mL 0   Vitamin D , Ergocalciferol , (DRISDOL ) 1.25 MG (50000 UNIT) CAPS capsule Take 1 capsule (50,000 Units total) by mouth every 7 (seven) days. 12 capsule 0   No current facility-administered medications for this visit.    Allergies as of 10/09/2024 - Review Complete 10/09/2024   Allergen Reaction Noted   Chlorhexidine  Hives 08/01/2024    Past Medical History:  Diagnosis Date   ACL tear 05/12/2014   injured on trampoline   Anemia    Anxiety    Depression    Dysrhythmia    GERD (gastroesophageal reflux disease)    Hashimoto's disease 2018   Headache(784.0)    stress   Heartburn    Hypothyroid    Palpitations    Polycystic ovarian syndrome    no current med.   Thyroid  disease     Past Surgical History:  Procedure Laterality Date   CHOLECYSTECTOMY N/A 07/08/2024   Procedure: LAPAROSCOPIC CHOLECYSTECTOMY;  Surgeon: Signe Mitzie LABOR, MD;  Location: [facility];  Service: General;  Laterality: N/A;  ICG   KNEE ARTHROSCOPY WITH ANTERIOR CRUCIATE LIGAMENT (ACL) REPAIR Left 06/05/2014   Procedure: LEFT KNEE ARTHROSCOPY WITH ANTERIOR CRUCIATE LIGAMENT (ACL) REPAIR;  Surgeon: Norleen LITTIE Gavel, MD;  Location: [facility];  Service: Orthopedics;  Laterality: Left;   WISDOM TOOTH EXTRACTION      Review of Systems:    All systems reviewed and negative except where noted in HPI.    Physical Exam:  BP 114/78   Pulse 72   Ht 5' 7 (1.702 m)   Wt 237 lb 8 oz (107.7 kg)   BMI 37.20 kg/m  No LMP recorded.  General: Well-nourished, well-developed in no acute distress.  Lungs: Clear to auscultation bilaterally. Non-labored. Heart: Regular rate and rhythm, no murmurs rubs or gallops.  Abdomen: Bowel sounds are normal; Abdomen is Soft; No hepatosplenomegaly, masses or hernias;  No Abdominal Tenderness; No guarding or rebound tenderness.  Laparoscopic incisions right upper quadrant are well-healing. Neuro: Alert and oriented x 3.  Grossly intact.  Psych: Alert and cooperative, normal mood and affect.   Imaging Studies: ECHOCARDIOGRAM COMPLETE Result Date: 09/25/2024    ECHOCARDIOGRAM REPORT   Patient Name:   Whitney Aguirre Date of Exam: 09/25/2024 Medical Rec #:  [id number]         Height:       67.0 in Accession #:    [id number]        Weight:        237.0 lb Date of Birth:  01-Nov-1991        BSA:          2.174 m Patient Age:    33 years          BP:           111/74 mmHg Patient Gender: F                 HR:           64 bpm. Exam Location:  [address] Procedure: 2D Echo, Cardiac Doppler and Color Doppler (Both Spectral and Color            Flow Doppler were utilized during procedure). Indications:    R00.2 Palpitations  History:        Patient has prior history of Echocardiogram examinations, most                 recent 06/29/2022. Palpitations.  Sonographer:  NaTashia Rodgers-Jones RDCS Referring Phys: [id number] HAO MENG IMPRESSIONS  1. Left ventricular ejection fraction, by estimation, is 60 to 65%. The left ventricle has normal function. The left ventricle has no regional wall motion abnormalities. Left ventricular diastolic parameters were normal.  2. Right ventricular systolic function is normal. The right ventricular size is normal. Tricuspid regurgitation signal is inadequate for assessing PA pressure.  3. The mitral valve is normal in structure. Trivial mitral valve regurgitation. No evidence of mitral stenosis.  4. The aortic valve is normal in structure. Aortic valve regurgitation is not visualized. No aortic stenosis is present.  5. The inferior vena cava is normal in size with <50% respiratory variability, suggesting right atrial pressure of 8 mmHg. FINDINGS  Left Ventricle: Left ventricular ejection fraction, by estimation, is 60 to 65%. The left ventricle has normal function. The left ventricle has no regional wall motion abnormalities. The left ventricular internal cavity size was normal in size. There is  no left ventricular hypertrophy. Left ventricular diastolic parameters were normal. Normal left ventricular filling pressure. Right Ventricle: The right ventricular size is normal. No increase in right ventricular wall thickness. Right ventricular systolic function is normal. Tricuspid regurgitation signal is inadequate for assessing PA  pressure. Left Atrium: Left atrial size was normal in size. Right Atrium: Right atrial size was normal in size. Pericardium: There is no evidence of pericardial effusion. Mitral Valve: The mitral valve is normal in structure. Trivial mitral valve regurgitation. No evidence of mitral valve stenosis. Tricuspid Valve: The tricuspid valve is normal in structure. Tricuspid valve regurgitation is trivial. No evidence of tricuspid stenosis. Aortic Valve: The aortic valve is normal in structure. Aortic valve regurgitation is not visualized. No aortic stenosis is present. Pulmonic Valve: The pulmonic valve was normal in structure. Pulmonic valve regurgitation is trivial. No evidence of pulmonic stenosis. Aorta: The aortic root is normal in size and structure. Venous: The inferior vena cava is normal in size with less than 50% respiratory variability, suggesting right atrial pressure of 8 mmHg. IAS/Shunts: The atrial septum is grossly normal.  LEFT VENTRICLE PLAX 2D LVIDd:         5.10 cm   Diastology LVIDs:         2.70 cm   LV e' medial:    10.08 cm/s LV PW:         0.80 cm   LV E/e' medial:  9.3 LV IVS:        0.60 cm   LV e' lateral:   9.79 cm/s LVOT diam:     2.00 cm   LV E/e' lateral: 9.6 LV SV:         83 LV SV Index:   38 LVOT Area:     3.14 cm  RIGHT VENTRICLE             IVC RV Basal diam:  3.80 cm     IVC diam: 1.20 cm RV S prime:     15.85 cm/s TAPSE (M-mode): 2.9 cm LEFT ATRIUM             Index        RIGHT ATRIUM           Index LA diam:        4.80 cm 2.21 cm/m   RA Area:     15.40 cm LA Vol (A2C):   48.3 ml 22.22 ml/m  RA Volume:   44.40 ml  20.43 ml/m LA Vol (A4C):   27.3 ml 12.56  ml/m LA Biplane Vol: 38.7 ml 17.81 ml/m  AORTIC VALVE LVOT Vmax:   101.60 cm/s LVOT Vmean:  66.450 cm/s LVOT VTI:    0.264 m  AORTA Ao Root diam: 2.60 cm Ao Asc diam:  2.80 cm MITRAL VALVE MV Area (PHT): 3.72 cm    SHUNTS MV Decel Time: 204 msec    Systemic VTI:  0.26 m MV E velocity: 93.80 cm/s  Systemic Diam: 2.00 cm MV A  velocity: 77.60 cm/s MV E/A ratio:  1.21 Oneil Parchment MD Electronically signed by Oneil Parchment MD Signature Date/Time: 09/25/2024/11:21:48 AM    Final    LONG TERM MONITOR (3-14 DAYS) Result Date: 09/17/2024   The dominant rhythm is normal sinus rhythm, with normal circadian variation.   There are rare premature supraventricular beats and rare premature ventricular beats.  A single ventricular triplet is seen.  There is no evidence of atrial fibrillation, ventricular tachycardia or other complex arrhythmia.   Symptom triggered recording (for fluttering, lightheadedness, shortness of breath) show sinus rhythm or mild sinus tachycardia.  Only on 1 occasion, the recording is triggered immediately following the ventricular triplet.   Electronically signed by Jerel Balding MD. Normal arrhythmia monitor.  The patient's symptoms are generally not associated with arrhythmia, other than mild sinus tachycardia. Patch Wear Time:  9 days and 23 hours (2025-09-24T10:26:15-0400 to 2025-10-04T10:14:28-0400) Patient had a min HR of 46 bpm, max HR of 161 bpm, and avg HR of 78 bpm. Predominant underlying rhythm was Sinus Rhythm. Isolated SVEs were rare (<1.0%), SVE Couplets were rare (<1.0%), and SVE Triplets were rare (<1.0%). Isolated VEs were rare (<1.0%, 6), VE Triplets were rare (<1.0%, 1), and no VE Couplets were present.    Labs: CBC    Component Value Date/Time   WBC 6.8 08/18/2024 2237   RBC 4.87 08/18/2024 2237   HGB 14.3 08/18/2024 2237   HGB 13.6 05/01/2024 0756   HCT 42.1 08/18/2024 2237   HCT 42.2 05/01/2024 0756   PLT 293 08/18/2024 2237   PLT 279 05/01/2024 0756   MCV 86.4 08/18/2024 2237   MCV 87 05/01/2024 0756   MCH 29.4 08/18/2024 2237   MCHC 34.0 08/18/2024 2237   RDW 12.3 08/18/2024 2237   RDW 12.3 05/01/2024 0756   LYMPHSABS 3.1 06/20/2024 1423   LYMPHSABS 2.6 09/04/2023 0842   MONOABS 0.6 06/20/2024 1423   EOSABS 0.2 06/20/2024 1423   EOSABS 0.2 09/04/2023 0842   BASOSABS 0.0  06/20/2024 1423   BASOSABS 0.0 09/04/2023 0842    CMP     Component Value Date/Time   NA 138 08/18/2024 2237   NA 144 05/01/2024 0756   K 4.1 08/18/2024 2237   CL 105 08/18/2024 2237   CO2 23 08/18/2024 2237   GLUCOSE 102 (H) 08/18/2024 2237   BUN 9 08/18/2024 2237   BUN 10 05/01/2024 0756   CREATININE 0.57 08/18/2024 2237   CREATININE 0.89 07/02/2020 1114   CALCIUM 9.1 08/18/2024 2237   PROT 7.5 06/20/2024 1423   PROT 6.0 05/01/2024 0756   ALBUMIN 4.6 06/20/2024 1423   ALBUMIN 3.9 (L) 05/01/2024 0756   AST 14 06/20/2024 1423   ALT 20 06/20/2024 1423   ALKPHOS 66 06/20/2024 1423   BILITOT 0.5 06/20/2024 1423   BILITOT <0.2 05/01/2024 0756   GFRNONAA >60 08/18/2024 2237   GFRAA >90 12/07/2014 1729       Assessment and Plan:   Whitney Aguirre is a 33 y.o. y/o female returns for follow-up of: Assessment &  Plan 1.  Persistent nausea, suspected gastrointestinal etiology Persistent morning nausea possibly related to Zepbound  or gastrointestinal issues. Differential includes H. pylori, gastritis, or ulcers. Nausea persists post-cholecystectomy. - Ordered upper endoscopy to evaluate for H. pylori, ulcers, gastritis, and other potential causes of nausea. - Scheduling EGD (check biopsies for H. pylori) I discussed risks of EGD with patient to include risk of bleeding, perforation, and risk of sedation.  Patient expressed understanding and agrees to proceed with EGD.  - Continue pantoprazole  40 mg at night for acid reflux. - Add famotidine  20 mg in the morning if breakthrough acid reflux occurs. - If nausea worsens, consider stopping Zepbound  and reassess symptoms.  2.  Gastroesophageal reflux disease - Continue pantoprazole  40 mg at night. - Use famotidine  in the morning if needed for breakthrough acid reflux. - Continue avoiding GERD trigger foods and drinks.  3.  Diarrhea: Likely related to metformin  and post-cholecystectomy bile salt diarrhea. - Consider cholestyramine  powder or Colestid if diarrhea becomes bothersome. - Patient declined prescription today.  4.  History of cholecystectomy for chronic cholecystitis with cholelithiasis --Gallbladder pathology showed chronic inflammation and gallstones. -- She has no more RUQ abdominal pain, postcholecystectomy.  She is feeling better.   Whitney Console, Whitney Aguirre  Follow up As Needed based on EGD results and GI symptoms.

## 2024-10-09 ENCOUNTER — Ambulatory Visit: Admitting: Physician Assistant

## 2024-10-09 ENCOUNTER — Encounter: Payer: Self-pay | Admitting: Physician Assistant

## 2024-10-09 VITALS — BP 114/78 | HR 72 | Ht 67.0 in | Wt 237.5 lb

## 2024-10-09 DIAGNOSIS — R197 Diarrhea, unspecified: Secondary | ICD-10-CM

## 2024-10-09 DIAGNOSIS — K219 Gastro-esophageal reflux disease without esophagitis: Secondary | ICD-10-CM | POA: Diagnosis not present

## 2024-10-09 DIAGNOSIS — R110 Nausea: Secondary | ICD-10-CM

## 2024-10-09 NOTE — Patient Instructions (Signed)
 You have been scheduled for an Endoscopy. Please follow written instructions given to you at your visit today.  If you use inhalers (even only as needed), please bring them with you on the day of your procedure.  If you take any of the following medications, they will need to be adjusted prior to your procedure:   DO NOT TAKE 7 DAYS PRIOR TO TEST- Trulicity (dulaglutide) Ozempic , Wegovy  (semaglutide ) Mounjaro (tirzepatide ) Bydureon Bcise (exanatide extended release)  DO NOT TAKE 1 DAY PRIOR TO YOUR TEST Rybelsus  (semaglutide ) Adlyxin (lixisenatide) Victoza (liraglutide) Byetta (exanatide) ___________________________________________________________________________  Please follow up sooner if symptoms increase or worsen  Due to recent changes in healthcare laws, you may see the results of your imaging and laboratory studies on MyChart before your provider has had a chance to review them.  We understand that in some cases there may be results that are confusing or concerning to you. Not all laboratory results come back in the same time frame and the provider may be waiting for multiple results in order to interpret others.  Please give us  48 hours in order for your provider to thoroughly review all the results before contacting the office for clarification of your results.   Thank you for trusting me with your gastrointestinal care!   [name], PA-C _______________________________________________________  If your blood pressure at your visit was 140/90 or greater, please contact your primary care physician to follow up on this.  _______________________________________________________  If you are age 34 or older, your body mass index should be between 23-30. Your Body mass index is 37.2 kg/m. If this is out of the aforementioned range listed, please consider follow up with your Primary Care Provider.  If you are age 44 or younger, your body mass index should be between 19-25. Your  Body mass index is 37.2 kg/m. If this is out of the aformentioned range listed, please consider follow up with your Primary Care Provider.   ________________________________________________________  The [facility] providers would like to encourage you to use MYCHART to communicate with providers for non-urgent requests or questions.  Due to long hold times on the telephone, sending your provider a message by [facility] may be a faster and more efficient way to get a response.  Please allow 48 business hours for a response.  Please remember that this is for non-urgent requests.  _______________________________________________________

## 2024-10-21 ENCOUNTER — Ambulatory Visit: Admitting: Family Medicine

## 2024-10-21 ENCOUNTER — Encounter: Payer: Self-pay | Admitting: Gastroenterology

## 2024-10-21 ENCOUNTER — Encounter: Payer: Self-pay | Admitting: Family Medicine

## 2024-10-21 VITALS — BP 114/64 | HR 75 | Temp 98.6°F | Ht 67.0 in | Wt 232.0 lb

## 2024-10-21 DIAGNOSIS — E88819 Insulin resistance, unspecified: Secondary | ICD-10-CM

## 2024-10-21 DIAGNOSIS — K219 Gastro-esophageal reflux disease without esophagitis: Secondary | ICD-10-CM | POA: Diagnosis not present

## 2024-10-21 DIAGNOSIS — K760 Fatty (change of) liver, not elsewhere classified: Secondary | ICD-10-CM

## 2024-10-21 DIAGNOSIS — E559 Vitamin D deficiency, unspecified: Secondary | ICD-10-CM | POA: Diagnosis not present

## 2024-10-21 DIAGNOSIS — Z6836 Body mass index (BMI) 36.0-36.9, adult: Secondary | ICD-10-CM

## 2024-10-21 MED ORDER — PHENTERMINE-TOPIRAMATE ER 3.75-23 MG PO CP24: ORAL_CAPSULE | ORAL | 0 refills | Status: DC

## 2024-10-21 NOTE — Patient Instructions (Signed)
 Hold metformin  Stay off Zepbound   Follow up EGD results Following EGD, you may start Qsymia  3.75/23 mg with breakfast daily  Do the best you can with meal planning/ food choices/ tracking calories/ walking 30 min /day on lunch breaks!

## 2024-10-21 NOTE — Progress Notes (Signed)
 Agree with the assessment and plan as outlined by Brigitte Canard, PA-C.

## 2024-10-21 NOTE — Progress Notes (Signed)
 Office: [phone]  /  Fax: [phone]  WEIGHT SUMMARY AND BIOMETRICS  Starting Date: 09/04/23  Starting Weight: 256lb   Weight Lost Since Last Visit: 5lb   Vitals Temp: 98.6 F (37 C) BP: 114/64 Pulse Rate: 75 SpO2: 98 %   Body Composition  Body Fat %: 42.7 % Fat Mass (lbs): 99.4 lbs Muscle Mass (lbs): 126.4 lbs Total Body Water (lbs): 90.6 lbs Visceral Fat Rating : 9   HPI  Chief Complaint: OBESITY  Whitney Aguirre is here to discuss her progress with her obesity treatment plan. She is on the the Category 3 Plan and states she is following her eating plan approximately 0 % of the time. She states she is exercising 30 minutes 2 times per week.  Interval History:  Since last office visit she is down 5 lb She is down 3.6 lb of muscle mass and down 1.4 lb of body fat since last visit This gives her a net weight loss of 24 lb in 13 mos of medically supervised weight management This is a 9.3% TBW loss She has been to [facility], notes reviewed from [name], [place] from 10/09/2024 She is scheduled for EGD with biopsy 12/3 She ran out of her Zepbound  5 mg once weekly injection 2 weeks ago She is still having complaints of morning nausea and dyspepsia She remains on metformin  daily per endocrinology for PCOS and insulin  resistance She does complain of hunger and food cravings She is not tracking her calories She has been inconsistently walking at work 30 minutes 2 days a week  Pharmacotherapy: Metformin  XR 500 mg once daily Zepbound  5 mg once weekly injection--ran out Never picked up prescription for Zepbound  7.5 mg, sent last visit  PHYSICAL EXAM:  Blood pressure 114/64, pulse 75, temperature 98.6 F (37 C), height 5' 7 (1.702 m), weight 232 lb (105.2 kg), SpO2 98%. Body mass index is 36.34 kg/m.  General: She is overweight, cooperative, alert, well developed, and in no acute distress. PSYCH: Has normal mood, affect and thought process.   Lungs: Normal breathing  effort, no conversational dyspnea.   ASSESSMENT AND PLAN  TREATMENT PLAN FOR OBESITY:  Recommended Dietary Goals  [name] is currently in the action stage of change. As such, her goal is to continue weight management plan. She has agreed to keeping a food journal and adhering to recommended goals of 1500 calories and 90 g of protein and practicing portion control and making smarter food choices, such as increasing vegetables and decreasing simple carbohydrates.  Behavioral Intervention  We discussed the following Behavioral Modification Strategies today: increasing lean protein intake to established goals, increasing fiber rich foods, increasing water intake , work on tracking and journaling calories using tracking application, keeping healthy foods at home, avoiding temptations and identifying enticing environmental cues, continue to practice mindfulness when eating, celebration eating strategies, and getting back on track after recent relapse.  Additional resources provided today: NA  Recommended Physical Activity Goals  [name] has been advised to work up to 150 minutes of moderate intensity aerobic activity a week and strengthening exercises 2-3 times per week for cardiovascular health, weight loss maintenance and preservation of muscle mass.   She has agreed to Start aerobic activity with a goal of 150 minutes a week at moderate intensity.  Increase walking time to 30 minutes daily  Pharmacotherapy changes for the treatment of obesity: Discontinue Zepbound .  Hold metformin .  Begin Qsymia  3.75/23 mg once daily with breakfast after EGD  ASSOCIATED CONDITIONS ADDRESSED TODAY  Insulin  resistance Last fasting insulin  elevated 05/01/2024 at 35.4.  She has been taking metformin  XR 500 mg once daily per endocrinology with associated PCOS.  She has room for improvement with both diet and exercise compliance.  Due to current GI symptoms, will hold her metformin  until following her EGD. Will  plan to restart at her next visit if doing well.  Focus on reducing added sugar and starch intake, increasing walking time for the treatment of insulin  resistance.  Morbid obesity (HCC) Will discontinue Zepbound  due to ongoing a.m. nausea and dyspepsia She previously had nausea from [place]  She has a net weight loss of 9.3% total body weight in the past 13 months of medically supervised weight management Consider the role of bariatric surgery if not seeing improvements given metabolic sequelae She is a good candidate for use of Qsymia  starting with dose 1 following EGD She is on oral contraceptives PDMP reviewed and informed consent signed.  Blood pressure and heart rate are within normal limits  Patient was counseled on the importance of maintaining healthy lifestyle habits, including balanced nutrition, regular physical activity, and behavioral modifications, while taking antiobesity medication.  Patient verbalized understanding that medication is an adjunct to, not a replacement for, lifestyle changes and that the long-term success and weight maintenance depend on continued adherence to these strategies.  -     Phentermine -Topiramate  ER; 1 capsule po qAM with breakfast  Dispense: 14 capsule; Refill: 0  BMI 36.0-36.9,adult  Hepatic steatosis Continue active plan for weight loss and treatment of underlying insulin  resistance for the treatment of hepatic steatosis.  Notably, she has had  GI intolerance from previous use use of multiple  GLP-1 receptor agonists  Gastroesophageal reflux disease, unspecified whether esophagitis present Worsened with a.m. nausea despite use of Protonix  40 mg once daily by GI.  Will discontinue Zepbound .  Continue Protonix  and proceed with EGD with H. pylori testing.  Vitamin D  deficiency Resume vitamin D  50,000 IU once weekly.  Her last vitamin D  level was still low less than 50.  She has associated fatigue.  Repeat vitamin D  level in the next 3 months.   She  was informed of the importance of frequent follow up visits to maximize her success with intensive lifestyle modifications for her multiple health conditions.   ATTESTASTION STATEMENTS:  Reviewed by clinician on day of visit: allergies, medications, problem list, medical history, surgical history, family history, social history, and previous encounter notes pertinent to obesity diagnosis.   I have personally spent 30 minutes total time today in preparation, patient care, nutritional counseling and education,  and documentation for this visit, including the following: review of most recent clinical lab tests, prescribing medications/ refilling medications, reviewing medical assistant documentation, review and interpretation of bioimpedence results.     Darice Haddock, D.O. DABFM, DABOM [facility] [address] [place], [place] [address] [phone]

## 2024-10-22 ENCOUNTER — Telehealth: Payer: Self-pay | Admitting: *Deleted

## 2024-10-22 NOTE — Telephone Encounter (Signed)
Prior authorization done for patients Qsymia. Waiting on determination.  

## 2024-10-27 ENCOUNTER — Telehealth: Admitting: Emergency Medicine

## 2024-10-27 DIAGNOSIS — J069 Acute upper respiratory infection, unspecified: Secondary | ICD-10-CM | POA: Diagnosis not present

## 2024-10-27 NOTE — Progress Notes (Signed)
 Virtual Visit Consent   Whitney Aguirre, you are scheduled for a virtual visit with a [place] [facility] provider today. Just as with appointments in the office, your consent must be obtained to participate. Your consent will be active for this visit and any virtual visit you may have with one of our providers in the next 365 days. If you have a MyChart account, a copy of this consent can be sent to you electronically.  As this is a virtual visit, video technology does not allow for your provider to perform a traditional examination. This may limit your provider's ability to fully assess your condition. If your provider identifies any concerns that need to be evaluated in person or the need to arrange testing (such as labs, EKG, etc.), we will make arrangements to do so. Although advances in technology are sophisticated, we cannot ensure that it will always work on either your end or our end. If the connection with a video visit is poor, the visit may have to be switched to a telephone visit. With either a video or telephone visit, we are not always able to ensure that we have a secure connection.  By engaging in this virtual visit, you consent to the provision of healthcare and authorize for your insurance to be billed (if applicable) for the services provided during this visit. Depending on your insurance coverage, you may receive a charge related to this service.  I need to obtain your verbal consent now. Are you willing to proceed with your visit today? Whitney Aguirre has provided verbal consent on 10/27/2024 for a virtual visit (video or telephone). Whitney CHRISTELLA Belt, NP  Date: 10/27/2024 2:09 PM   Virtual Visit via Video Note   I, Whitney Aguirre, connected with  Whitney Aguirre  ([id number], 02-20-1991) on 10/27/24 at  2:00 PM EST by a video-enabled telemedicine application and verified that I am speaking with the correct person using two identifiers.  Location: Patient: Virtual Visit Location  Patient: Other: work Provider: Pharmacist, Community: Home Office   I discussed the limitations of evaluation and management by telemedicine and the availability of in person appointments. The patient expressed understanding and agreed to proceed.    History of Present Illness: Whitney Aguirre is a 33 y.o. who identifies as a female who was assigned female at birth, and is being seen today for cousin lives with her, was sick for 2 weeks, then her husband tested positive covid and dx pneumonia last week.   On 10/24/24 developed sore throat, fever through 10/25/04, now worse sore throat, and hoarse voice, post nasal drainage, cough. No SOB. Has not tested self for covid.   Taking elderberry, nyquil/dayquil, ibuprofen  when she had fever.   HPI: HPI  Problems:  Patient Active Problem List   Diagnosis Date Noted   Hyperlipidemia 08/11/2024   Hepatic steatosis 05/28/2024   Chronic left-sided low back pain 04/07/2024   Metabolic syndrome 09/27/2023   Vitamin D  deficiency 09/27/2023   Insulin  resistance 09/27/2023   Other fatigue 09/04/2023   SOBOE (shortness of breath on exertion) 09/04/2023   Other specified hypothyroidism 09/04/2023   Anxiety and depression 09/04/2023   Polyphagia 09/04/2023   Routine general medical examination at a health care facility 09/04/2023   BMI 40.0-44.9, adult (HCC) 09/04/2023   Migraine without status migrainosus, not intractable 08/20/2023   PCOS (polycystic ovarian syndrome) 08/20/2023   Obesity, Class II, BMI 35-39.9 08/20/2023   Family history of cardiomyopathy 05/28/2022  Depression, major, single episode, moderate (HCC) 01/26/2021   Anxiety disorder, unspecified 01/26/2021   Chronic nausea 06/28/2020   Headache, unspecified headache type 01/28/2019   Morbid obesity (HCC) 09/04/2018   GERD (gastroesophageal reflux disease) 04/02/2018   Acquired hypothyroidism 10/04/2016   Insomnia disorder 09/07/2016   Allergic rhinitis 08/29/2016    Chest pain 01/04/2012   Palpitations 01/04/2012    Allergies:  Allergies  Allergen Reactions   Chlorhexidine  Hives   Medications:  Current Outpatient Medications:    DULoxetine  (CYMBALTA ) 30 MG capsule, TAKE 1 CAPSULE BY MOUTH DAILY, Disp: 30 capsule, Rfl: 0   levothyroxine  (SYNTHROID ) 150 MCG tablet, Take 150-300 mcg by mouth See admin instructions. Take 300 mcg Monday- Saturday twice a day, Take 150 mcg once a day on Sunday, Disp: , Rfl:    meloxicam  (MOBIC ) 15 MG tablet, Take 1 tablet (15 mg total) by mouth daily as needed for pain., Disp: 30 tablet, Rfl: 1   methocarbamol  (ROBAXIN ) 500 MG tablet, Take 1 tablet (500 mg total) by mouth every 8 (eight) hours as needed for muscle spasms., Disp: 45 tablet, Rfl: 0   metoprolol  tartrate (LOPRESSOR ) 25 MG tablet, Take 0.5 tablets (12.5 mg total) by mouth 2 (two) times daily as needed (for palpitations)., Disp: 30 tablet, Rfl: 4   norethindrone-ethinyl estradiol -FE (LOESTRIN FE) 1-20 MG-MCG tablet, Take 1 tablet by mouth every evening., Disp: , Rfl:    ondansetron  (ZOFRAN -ODT) 8 MG disintegrating tablet, Take 1 tablet (8 mg total) by mouth every 8 (eight) hours as needed., Disp: 20 tablet, Rfl: 1   pantoprazole  (PROTONIX ) 40 MG tablet, Take 1 tablet (40 mg total) by mouth daily., Disp: 90 tablet, Rfl: 0   Phentermine -Topiramate  ER (QSYMIA ) 3.75-23 MG CP24, 1 capsule po qAM with breakfast, Disp: 14 capsule, Rfl: 0   Vitamin D , Ergocalciferol , (DRISDOL ) 1.25 MG (50000 UNIT) CAPS capsule, Take 1 capsule (50,000 Units total) by mouth every 7 (seven) days., Disp: 12 capsule, Rfl: 0  Observations/Objective: Patient is well-developed, well-nourished in no acute distress.  Resting comfortably  at work in [place].  Head is normocephalic, atraumatic.  No labored breathing.  Speech is clear and coherent with logical content.  Patient is alert and oriented at baseline.  Voice is hoarse  Assessment and Plan: 1. Upper respiratory tract infection, unspecified  type (Primary)  Most likely also has covid like her husband.   Follow Up Instructions: I discussed the assessment and treatment plan with the patient. The patient was provided an opportunity to ask questions and all were answered. The patient agreed with the plan and demonstrated an understanding of the instructions.  A copy of instructions were sent to the patient via MyChart unless otherwise noted below.   The patient was advised to call back or seek an in-person evaluation if the symptoms worsen or if the condition fails to improve as anticipated.    Whitney CHRISTELLA Belt, NP

## 2024-10-27 NOTE — Telephone Encounter (Signed)
 Patients Qsymia  WAS APPROVED.  Approved on November 26 by [employer] [facility];Review Type:Prior Auth;Coverage Start Date:09/22/2024;Coverage End Date:04/20/2025;

## 2024-10-27 NOTE — Patient Instructions (Signed)
 Whitney Aguirre, thank you for joining Whitney CHRISTELLA Belt, NP for today's virtual visit.  While this provider is not your primary care provider (PCP), if your PCP is located in our provider database this encounter information will be shared with them immediately following your visit.   A [facility] MyChart account gives you access to today's visit and all your visits, tests, and labs performed at [facility]  click here if you don't have a [facility] MyChart account or go to [email or web address]  Consent: (Patient) Whitney Aguirre provided verbal consent for this virtual visit at the beginning of the encounter.  Current Medications:  Current Outpatient Medications:    DULoxetine  (CYMBALTA ) 30 MG capsule, TAKE 1 CAPSULE BY MOUTH DAILY, Disp: 30 capsule, Rfl: 0   levothyroxine  (SYNTHROID ) 150 MCG tablet, Take 150-300 mcg by mouth See admin instructions. Take 300 mcg Monday- Saturday twice a day, Take 150 mcg once a day on Sunday, Disp: , Rfl:    meloxicam  (MOBIC ) 15 MG tablet, Take 1 tablet (15 mg total) by mouth daily as needed for pain., Disp: 30 tablet, Rfl: 1   methocarbamol  (ROBAXIN ) 500 MG tablet, Take 1 tablet (500 mg total) by mouth every 8 (eight) hours as needed for muscle spasms., Disp: 45 tablet, Rfl: 0   metoprolol  tartrate (LOPRESSOR ) 25 MG tablet, Take 0.5 tablets (12.5 mg total) by mouth 2 (two) times daily as needed (for palpitations)., Disp: 30 tablet, Rfl: 4   norethindrone-ethinyl estradiol -FE (LOESTRIN FE) 1-20 MG-MCG tablet, Take 1 tablet by mouth every evening., Disp: , Rfl:    ondansetron  (ZOFRAN -ODT) 8 MG disintegrating tablet, Take 1 tablet (8 mg total) by mouth every 8 (eight) hours as needed., Disp: 20 tablet, Rfl: 1   pantoprazole  (PROTONIX ) 40 MG tablet, Take 1 tablet (40 mg total) by mouth daily., Disp: 90 tablet, Rfl: 0   Phentermine -Topiramate  ER (QSYMIA ) 3.75-23 MG CP24, 1 capsule po qAM with breakfast, Disp: 14 capsule, Rfl: 0   Vitamin D ,  Ergocalciferol , (DRISDOL ) 1.25 MG (50000 UNIT) CAPS capsule, Take 1 capsule (50,000 Units total) by mouth every 7 (seven) days., Disp: 12 capsule, Rfl: 0   Medications ordered in this encounter:  No orders of the defined types were placed in this encounter.    *If you need refills on other medications prior to your next appointment, please contact your pharmacy*  Follow-Up: Call back or seek an in-person evaluation if the symptoms worsen or if the condition fails to improve as anticipated.  [facility] [phone]  Other Instructions I suspect you probably have COVID like your husband. I recommend you test yourself for COVID at home. If you test positive and decide you want the antiviral medicine (paxlovid ), let me know - you can send a message through MyChart.   Otherwise, use throat lozenges for hoarse voice, rest your voice as much as possible, continue over the counter medicines to help manage your symptoms.    If you have been instructed to have an in-person evaluation today at a local Urgent Care facility, please use the link below. It will take you to a list of all of our available [facility], including address, phone number and hours of operation. Please do not delay care.  [facility]  If you or a family member do not have a primary care provider, use the link below to schedule a visit and establish care. When you choose a [facility] primary care physician or advanced practice  provider, you gain a long-term partner in health. Find a Primary Care Provider  Learn more about [facility]'s in-office and virtual care options: [facility] - Get Care Now

## 2024-10-28 DIAGNOSIS — R051 Acute cough: Secondary | ICD-10-CM | POA: Diagnosis not present

## 2024-10-28 DIAGNOSIS — R0981 Nasal congestion: Secondary | ICD-10-CM | POA: Diagnosis not present

## 2024-10-29 ENCOUNTER — Encounter: Admitting: Gastroenterology

## 2024-11-11 ENCOUNTER — Ambulatory Visit: Admitting: Family Medicine

## 2024-11-11 ENCOUNTER — Encounter: Payer: Self-pay | Admitting: Family Medicine

## 2024-11-11 VITALS — BP 110/74 | HR 69 | Ht 67.0 in | Wt 243.0 lb

## 2024-11-11 DIAGNOSIS — E559 Vitamin D deficiency, unspecified: Secondary | ICD-10-CM | POA: Diagnosis not present

## 2024-11-11 DIAGNOSIS — E282 Polycystic ovarian syndrome: Secondary | ICD-10-CM | POA: Diagnosis not present

## 2024-11-11 DIAGNOSIS — K760 Fatty (change of) liver, not elsewhere classified: Secondary | ICD-10-CM

## 2024-11-11 DIAGNOSIS — K219 Gastro-esophageal reflux disease without esophagitis: Secondary | ICD-10-CM

## 2024-11-11 DIAGNOSIS — Z6838 Body mass index (BMI) 38.0-38.9, adult: Secondary | ICD-10-CM

## 2024-11-11 MED ORDER — PHENTERMINE-TOPIRAMATE ER 3.75-23 MG PO CP24: ORAL_CAPSULE | ORAL | 0 refills | Status: DC

## 2024-11-11 MED ORDER — VITAMIN D (ERGOCALCIFEROL) 1.25 MG (50000 UNIT) PO CAPS: 50000.0000 [IU] | ORAL_CAPSULE | ORAL | 0 refills | Status: DC

## 2024-11-11 MED ORDER — PHENTERMINE-TOPIRAMATE ER 7.5-46 MG PO CP24: 1.0000 | ORAL_CAPSULE | Freq: Every day | ORAL | 0 refills | Status: DC

## 2024-11-11 NOTE — Patient Instructions (Signed)
 Plan to start Qsymia  after EGD  Start Qsymia  at 3.75/23 mg each morning w/ breakfast x 14 days then increase to 7.5/46 mg daily w/ breakfast  Call if any problems or questions  Begin tracking calorie intake aiming for 1600 cal/ day This should include 90-100 g of protein daily  Limit high sugar foods and drinks Remember your fruits and veggies

## 2024-11-11 NOTE — Progress Notes (Signed)
 Office: [phone]  /  Fax: [phone]  WEIGHT SUMMARY AND BIOMETRICS  Starting Date: 09/04/23  Starting Weight: 256lb   Weight Lost Since Last Visit: 0lb   Vitals BP: 110/74 Pulse Rate: 69 SpO2: 100 %   Body Composition  Body Fat %: 43.8 % Fat Mass (lbs): 106.6 lbs Muscle Mass (lbs): 130 lbs Total Body Water (lbs): 93.4 lbs Visceral Fat Rating : 10    HPI  Chief Complaint: OBESITY  Whitney Aguirre is here to discuss her progress with her obesity treatment plan. She is on the the Category 3 Plan and states she is following her eating plan approximately 0 % of the time. She states she is exercising 0 minutes 0 times per week.  Interval History:  Since last office visit she is up 11 lb She has not started Qsymia  since last visit She was sick with a  URI requiring steroids since last visit This did make her hungrier She is planning to have EGD 12/23 She plans to work on meal planning and cooking at home Walking has been limited due to weather changes She plans to increase her water intake This gives her net weight loss of 13 pounds and 14 months of medically supervised weight management This is a 5% total body weight loss  Pharmacotherapy: Qsymia  (has not yet started)  PHYSICAL EXAM:  Blood pressure 110/74, pulse 69, height 5' 7 (1.702 m), weight 243 lb (110.2 kg), SpO2 100%. Body mass index is 38.06 kg/m.  General: She is overweight, cooperative, alert, well developed, and in no acute distress. PSYCH: Has normal mood, affect and thought process.   Lungs: Normal breathing effort, no conversational dyspnea.  ASSESSMENT AND PLAN  TREATMENT PLAN FOR OBESITY:  Recommended Dietary Goals  Whitney Aguirre is currently in the action stage of change. As such, her goal is to continue weight management plan. She has agreed to keeping a food journal and adhering to recommended goals of 1600 calories and 90 to 100 g of protein.  Behavioral Intervention  We discussed the  following Behavioral Modification Strategies today: increasing lean protein intake to established goals, increasing fiber rich foods, increasing water intake , work on meal planning and preparation, work on counselling psychologist calories using tracking application, keeping healthy foods at home, practice mindfulness eating and understand the difference between hunger signals and cravings, work on managing stress, creating time for self-care and relaxation, avoiding temptations and identifying enticing environmental cues, continue to practice mindfulness when eating, and planning for success.  Additional resources provided today: NA  Recommended Physical Activity Goals  Whitney Aguirre has been advised to work up to 150 minutes of moderate intensity aerobic activity a week and strengthening exercises 2-3 times per week for cardiovascular health, weight loss maintenance and preservation of muscle mass.   She has agreed to Think about enjoyable ways to increase daily physical activity and overcoming barriers to exercise and Increase physical activity in their day and reduce sedentary time (increase NEAT).  Pharmacotherapy changes for the treatment of obesity: Begin Qsymia  3.75/23 mg once daily with breakfast for 14 days then increase to 7.5/46 mg once daily with breakfast starting after EGD Form of birth control: OCPs  ASSOCIATED CONDITIONS ADDRESSED TODAY  Hepatic steatosis Fibrosis 4 Score = .33  Fib-4 interpretation is not validated for people under 35 or over 9 years of age. However, scores under 2.0 are generally considered low risk.   Fib 4 score low risk.  Continue active plan for weight reduction. Limit alcohol intake,  sugar and saturated fat  Morbid obesity (HCC) -     Phentermine -Topiramate  ER; 1 capsule po qAM with breakfast  Dispense: 14 capsule; Refill: 0 -     Phentermine -Topiramate  ER; Take 1 capsule by mouth daily.  Dispense: 30 capsule; Refill: 0  She is feeling better off Zepbound   which cause dyspepsia and acid reflux.  She has not yet started Qsymia .  She will begin after her EGD, closely monitoring her bicarb levels in combination with metformin .  If patient is unable to achieve adequate weight loss using Qsymia  plus reduced calorie diet, we will discuss the option for weight loss surgery.  Vitamin D  deficiency -     Vitamin D  (Ergocalciferol ); Take 1 capsule (50,000 Units total) by mouth every 7 (seven) days.  Dispense: 12 capsule; Refill: 0 Last vitamin D  Lab Results  Component Value Date   VD25OH 33.06 08/11/2024  She is on vitamin D  50,000 IU once weekly.  Repeat lab in the next 1 to 2 months  BMI 38.0-38.9,adult  Gastroesophageal reflux disease, unspecified whether esophagitis present Improved off Zepbound .  Has upcoming EGD scheduled with [facility] Will keep her off all GLP-1 receptor agonists  PCOS (polycystic ovarian syndrome) She may resume metformin  following her EGD.  She saw no change in her GI symptoms on her off metformin . Continue to work on limiting added sugar and starch intake while increasing both resistance training and cardio physical activity.     She was informed of the importance of frequent follow up visits to maximize her success with intensive lifestyle modifications for her multiple health conditions.   ATTESTASTION STATEMENTS:  Reviewed by clinician on day of visit: allergies, medications, problem list, medical history, surgical history, family history, social history, and previous encounter notes pertinent to obesity diagnosis.   I have personally spent 30 minutes total time today in preparation, patient care, nutritional counseling and education,  and documentation for this visit, including the following: review of most recent clinical lab tests, prescribing medications/ refilling medications, reviewing medical assistant documentation, review and interpretation of bioimpedence results.     Darice Haddock, D.O. DABFM, DABOM [facility] [address] [place], [place] [address] [phone]

## 2024-11-18 ENCOUNTER — Encounter: Payer: Self-pay | Admitting: Gastroenterology

## 2024-11-18 ENCOUNTER — Ambulatory Visit: Admitting: Gastroenterology

## 2024-11-18 VITALS — BP 117/82 | HR 53 | Temp 97.9°F | Resp 10 | Ht 67.0 in | Wt 237.0 lb

## 2024-11-18 DIAGNOSIS — K219 Gastro-esophageal reflux disease without esophagitis: Secondary | ICD-10-CM

## 2024-11-18 DIAGNOSIS — R110 Nausea: Secondary | ICD-10-CM

## 2024-11-18 DIAGNOSIS — K2950 Unspecified chronic gastritis without bleeding: Secondary | ICD-10-CM | POA: Diagnosis not present

## 2024-11-18 DIAGNOSIS — K224 Dyskinesia of esophagus: Secondary | ICD-10-CM | POA: Diagnosis not present

## 2024-11-18 MED ORDER — SODIUM CHLORIDE 0.9 % IV SOLN: 500.0000 mL | INTRAVENOUS | Status: DC

## 2024-11-18 NOTE — Progress Notes (Signed)
 Called to room to assist during endoscopic procedure.  Patient ID and intended procedure confirmed with present staff. Received instructions for my participation in the procedure from the performing physician.

## 2024-11-18 NOTE — Patient Instructions (Addendum)
 Resume previous diet.                           - Continue present medications.                           - Await pathology results.                           - Follow anti-reflux diet, behavioral modifications    YOU HAD AN ENDOSCOPIC PROCEDURE TODAY AT THE [facility]:   Refer to the procedure report that was given to you for any specific questions about what was found during the examination.  If the procedure report does not answer your questions, please call your gastroenterologist to clarify.  If you requested that your care partner not be given the details of your procedure findings, then the procedure report has been included in a sealed envelope for you to review at your convenience later.  YOU SHOULD EXPECT: Some feelings of bloating in the abdomen. Passage of more gas than usual.  Walking can help get rid of the air that was put into your GI tract during the procedure and reduce the bloating. If you had a lower endoscopy (such as a colonoscopy or flexible sigmoidoscopy) you may notice spotting of blood in your stool or on the toilet paper. If you underwent a bowel prep for your procedure, you may not have a normal bowel movement for a few days.  Please Note:  You might notice some irritation and congestion in your nose or some drainage.  This is from the oxygen used during your procedure.  There is no need for concern and it should clear up in a day or so.  SYMPTOMS TO REPORT IMMEDIATELY:   Following upper endoscopy (EGD)  Vomiting of blood or coffee ground material  New chest pain or pain under the shoulder blades  Painful or persistently difficult swallowing  New shortness of breath  Fever of 100F or higher  Black, tarry-looking stools  For urgent or emergent issues, a gastroenterologist can be reached at any hour by calling [phone]. Do not use MyChart messaging for urgent concerns.    DIET:  We do recommend a small meal at first, but then you may proceed  to your regular diet.  Drink plenty of fluids but you should avoid alcoholic beverages for 24 hours.  ACTIVITY:  You should plan to take it easy for the rest of today and you should NOT DRIVE or use heavy machinery until tomorrow (because of the sedation medicines used during the test).    FOLLOW UP: Our staff will call the number listed on your records the next business day following your procedure.  We will call around 7:15- 8:00 am to check on you and address any questions or concerns that you may have regarding the information given to you following your procedure. If we do not reach you, we will leave a message.     If any biopsies were taken you will be contacted by phone or by letter within the next 1-3 weeks.  Please call us  at [phone] if you have not heard about the biopsies in 3 weeks.    SIGNATURES/CONFIDENTIALITY: You and/or your care partner have signed paperwork which will be entered into your electronic medical record.  These signatures attest to the fact that that the  information above on your After Visit Summary has been reviewed and is understood.  Full responsibility of the confidentiality of this discharge information lies with you and/or your care-partner.

## 2024-11-18 NOTE — Progress Notes (Signed)
 Vss nad trans to pacu

## 2024-11-18 NOTE — Progress Notes (Signed)
 [facility] History and Physical   Primary Care Physician:  Jordan, Betty G, MD   Reason for Procedure:   Nausea  Plan:    EGD   HPI: Whitney Aguirre is a 33 y.o. female undergoing EGD to evaluate chronic nausea.  Nausea is regularly encountered in the morning.  No vomiting or abdominal pain.  No typical GERD symptoms (heartburn/acid regurgitation).  Symptoms persist despite daily PPI.  The patient was provided an opportunity to ask questions and all were answered. The patient agreed with the plan    Past Medical History:  Diagnosis Date   ACL tear 05/12/2014   injured on trampoline   Anemia    Anxiety    Depression    Dysrhythmia    GERD (gastroesophageal reflux disease)    Hashimoto's disease 2018   Headache(784.0)    stress   Heartburn    Hypothyroid    Palpitations    Polycystic ovarian syndrome    no current med.   Thyroid  disease     Past Surgical History:  Procedure Laterality Date   CHOLECYSTECTOMY N/A 07/08/2024   Procedure: LAPAROSCOPIC CHOLECYSTECTOMY;  Surgeon: Signe Mitzie LABOR, MD;  Location: [facility];  Service: General;  Laterality: N/A;  ICG   KNEE ARTHROSCOPY WITH ANTERIOR CRUCIATE LIGAMENT (ACL) REPAIR Left 06/05/2014   Procedure: LEFT KNEE ARTHROSCOPY WITH ANTERIOR CRUCIATE LIGAMENT (ACL) REPAIR;  Surgeon: Norleen LITTIE Gavel, MD;  Location: [facility];  Service: Orthopedics;  Laterality: Left;   WISDOM TOOTH EXTRACTION      Prior to Admission medications  Medication Sig Start Date End Date Taking? Authorizing Provider  DULoxetine  (CYMBALTA ) 30 MG capsule TAKE 1 CAPSULE BY MOUTH DAILY 07/08/24  Yes Jaffe, Adam R, DO  levothyroxine  (SYNTHROID ) 125 MCG tablet Take 125 mcg by mouth.   Yes [provider]  norethindrone-ethinyl estradiol -FE (LOESTRIN FE) 1-20 MG-MCG tablet Take 1 tablet by mouth every evening. 05/28/23  Yes [provider]  Vitamin D , Ergocalciferol , (DRISDOL ) 1.25 MG (50000 UNIT) CAPS capsule Take 1  capsule (50,000 Units total) by mouth every 7 (seven) days. 11/11/24  Yes Bowen, Darice BRAVO, DO  meloxicam  (MOBIC ) 15 MG tablet Take 1 tablet (15 mg total) by mouth daily as needed for pain. 04/07/24   Jordan, Betty G, MD  methocarbamol  (ROBAXIN ) 500 MG tablet Take 1 tablet (500 mg total) by mouth every 8 (eight) hours as needed for muscle spasms. 04/07/24   Jordan, Betty G, MD  metoprolol  tartrate (LOPRESSOR ) 25 MG tablet Take 0.5 tablets (12.5 mg total) by mouth 2 (two) times daily as needed (for palpitations). 05/25/22 10/09/24  Croitoru, Mihai, MD  ondansetron  (ZOFRAN -ODT) 8 MG disintegrating tablet Take 1 tablet (8 mg total) by mouth every 8 (eight) hours as needed. 09/23/24   Bowen, Darice BRAVO, DO  pantoprazole  (PROTONIX ) 40 MG tablet Take 1 tablet (40 mg total) by mouth daily. 08/18/24   Bowen, Darice BRAVO, DO  Phentermine -Topiramate  ER (QSYMIA ) 3.75-23 MG CP24 1 capsule po qAM with breakfast 11/11/24   Bowen, Karen E, DO  Phentermine -Topiramate  ER (QSYMIA ) 7.5-46 MG CP24 Take 1 capsule by mouth daily. 11/11/24   Bowen, Darice BRAVO, DO    Current Outpatient Medications  Medication Sig Dispense Refill   DULoxetine  (CYMBALTA ) 30 MG capsule TAKE 1 CAPSULE BY MOUTH DAILY 30 capsule 0   levothyroxine  (SYNTHROID ) 125 MCG tablet Take 125 mcg by mouth.     norethindrone-ethinyl estradiol -FE (LOESTRIN FE) 1-20 MG-MCG tablet Take 1 tablet by mouth every evening.  Vitamin D , Ergocalciferol , (DRISDOL ) 1.25 MG (50000 UNIT) CAPS capsule Take 1 capsule (50,000 Units total) by mouth every 7 (seven) days. 12 capsule 0   meloxicam  (MOBIC ) 15 MG tablet Take 1 tablet (15 mg total) by mouth daily as needed for pain. 30 tablet 1   methocarbamol  (ROBAXIN ) 500 MG tablet Take 1 tablet (500 mg total) by mouth every 8 (eight) hours as needed for muscle spasms. 45 tablet 0   metoprolol  tartrate (LOPRESSOR ) 25 MG tablet Take 0.5 tablets (12.5 mg total) by mouth 2 (two) times daily as needed (for palpitations). 30 tablet 4    ondansetron  (ZOFRAN -ODT) 8 MG disintegrating tablet Take 1 tablet (8 mg total) by mouth every 8 (eight) hours as needed. 20 tablet 1   pantoprazole  (PROTONIX ) 40 MG tablet Take 1 tablet (40 mg total) by mouth daily. 90 tablet 0   Phentermine -Topiramate  ER (QSYMIA ) 3.75-23 MG CP24 1 capsule po qAM with breakfast 14 capsule 0   Phentermine -Topiramate  ER (QSYMIA ) 7.5-46 MG CP24 Take 1 capsule by mouth daily. 30 capsule 0   Current Facility-Administered Medications  Medication Dose Route Frequency Provider Last Rate Last Admin   0.9 %  sodium chloride  infusion  500 mL Intravenous Continuous Stacia Glendia BRAVO, MD        Allergies as of 11/18/2024 - Review Complete 11/18/2024  Allergen Reaction Noted   Chlorhexidine  Hives 08/01/2024    Family History  Problem Relation Age of Onset   Hyperlipidemia Mother    Hypertension Mother    Cardiomyopathy Mother    High blood pressure Mother    Emphysema Mother    Heart disease Mother    Sudden death Mother    Kidney disease Mother    Obesity Mother    Ulcers Father    Alcoholism Father    Drug abuse Father    Cancer Paternal Grandfather    Diabetes Other    Hypertension Other    Liver disease Neg Hx    Colon cancer Neg Hx    Esophageal cancer Neg Hx    Rectal cancer Neg Hx    Stomach cancer Neg Hx     Social History   Socioeconomic History   Marital status: Married    Spouse name: Not on file   Number of children: 0   Years of education: College/RN   Highest education level: GED or equivalent  Occupational History    Employer: [employer]    Comment: ED-RN  Tobacco Use   Smoking status: Never   Smokeless tobacco: Never  Vaping Use   Vaping status: Never Used  Substance and Sexual Activity   Alcohol use: Not Currently    Comment: rare   Drug use: No   Sexual activity: Yes  Other Topics Concern   Not on file  Social History Narrative   Patient lives at home with family.   Caffeine  Use:1-2 cups   Right handed    Social Drivers of Health   Tobacco Use: Low Risk (11/18/2024)   Patient History    Smoking Tobacco Use: Never    Smokeless Tobacco Use: Never    Passive Exposure: Not on file  Financial Resource Strain: Low Risk (08/11/2024)   Overall Financial Resource Strain (CARDIA)    Difficulty of Paying Living Expenses: Not very hard  Food Insecurity: No Food Insecurity (08/11/2024)   Epic    Worried About Radiation Protection Practitioner of Food in the Last Year: Never true    Ran Out of Food in the Last Year:  Never true  Transportation Needs: No Transportation Needs (08/11/2024)   Epic    Lack of Transportation (Medical): No    Lack of Transportation (Non-Medical): No  Physical Activity: Inactive (08/11/2024)   Exercise Vital Sign    Days of Exercise per Week: 0 days    Minutes of Exercise per Session: Not on file  Stress: No Stress Concern Present (08/11/2024)   [name] of Occupational Health - Occupational Stress Questionnaire    Feeling of Stress: Not at all  Social Connections: Socially Integrated (08/11/2024)   Social Connection and Isolation Panel    Frequency of Communication with Friends and Family: Three times a week    Frequency of Social Gatherings with Friends and Family: Twice a week    Attends Religious Services: More than 4 times per year    Active Member of [employer] or Organizations: Yes    Attends Banker Meetings: 1 to 4 times per year    Marital Status: Married  Catering Manager Violence: Not on file  Depression (PHQ2-9): Low Risk (08/11/2024)   Depression (PHQ2-9)    PHQ-2 Score: 0  Alcohol Screen: Not on file  Housing: Unknown (08/11/2024)   Epic    Unable to Pay for Housing in the Last Year: No    Number of Times Moved in the Last Year: Not on file    Homeless in the Last Year: No  Utilities: Not on file  Health Literacy: Not on file    Review of Systems:  All other review of systems negative except as mentioned in the HPI.  Physical Exam: Vital signs BP  134/81   Pulse (!) 54   Temp 97.9 F (36.6 C) (Temporal)   Resp 15   Ht 5' 7 (1.702 m)   Wt 237 lb (107.5 kg)   SpO2 98%   BMI 37.12 kg/m   General:   Alert,  Well-developed, well-nourished, pleasant and cooperative in NAD Airway:  Mallampati 1 Lungs:  Clear throughout to auscultation.   Heart:  Regular rate and rhythm; no murmurs, clicks, rubs,  or gallops. Abdomen:  Soft, nontender and nondistended. Normal bowel sounds.   Neuro/Psych:  Normal mood and affect. A and O x 3   Jadakiss Barish E. Stacia, MD [facility]

## 2024-11-18 NOTE — Op Note (Signed)
 [facility] Patient Name: Whitney Aguirre Procedure Date: 11/18/2024 10:25 AM MRN: [id number] Endoscopist: Glendia E. Stacia , MD, [id number] Age: 33 Referring MD:  Date of Birth: 1991-10-30 Gender: Female Account #: [id number] Procedure:                Upper GI endoscopy Indications:              Nausea Medicines:                Monitored Anesthesia Care Procedure:                Pre-Anesthesia Assessment:                           - Prior to the procedure, a History and Physical                            was performed, and patient medications and                            allergies were reviewed. The patient's tolerance of                            previous anesthesia was also reviewed. The risks                            and benefits of the procedure and the sedation                            options and risks were discussed with the patient.                            All questions were answered, and informed consent                            was obtained. Prior Anticoagulants: The patient has                            taken no anticoagulant or antiplatelet agents. ASA                            Grade Assessment: II - A patient with mild systemic                            disease. After reviewing the risks and benefits,                            the patient was deemed in satisfactory condition to                            undergo the procedure.                           After obtaining informed consent, the endoscope was  passed under direct vision. Throughout the                            procedure, the patient's blood pressure, pulse, and                            oxygen saturations were monitored continuously. The                            GIF [id number] #[id number] was introduced through the                            mouth, and advanced to the second part of duodenum.                            The upper GI endoscopy was accomplished  without                            difficulty. The patient tolerated the procedure                            well. Scope In: Scope Out: Findings:                 The examined portions of the nasopharynx,                            oropharynx and larynx were normal.                           The examined esophagus was normal. Laxity of the                            LES was noted.                           The gastroesophageal flap valve was visualized                            endoscopically and classified as Hill Grade III                            (minimal fold, loose to endoscope, hiatal hernia                            likely).                           The entire examined stomach was normal. Biopsies                            were taken with a cold forceps for Helicobacter                            pylori testing. Estimated blood loss was minimal.  The examined duodenum was normal. Complications:            No immediate complications. Estimated Blood Loss:     Estimated blood loss was minimal. Impression:               - The examined portions of the nasopharynx,                            oropharynx and larynx were normal.                           - Normal esophagus with laxity of the LES                           - Gastroesophageal flap valve classified as Hill                            Grade III (minimal fold, loose to endoscope, hiatal                            hernia likely).                           - Normal stomach. Biopsied.                           - Normal examined duodenum.                           - Nausea upon awakening may be related to nocturnal                            GERD Recommendation:           - Patient has a contact number available for                            emergencies. The signs and symptoms of potential                            delayed complications were discussed with the                            patient.  Return to normal activities tomorrow.                            Written discharge instructions were provided to the                            patient.                           - Resume previous diet.                           - Continue present medications.                           -  Await pathology results.                           - Follow anti-reflux diet, behavioral modifications Jhair Witherington E. Stacia, MD 11/18/2024 10:54:19 AM This report has been signed electronically.

## 2024-11-19 ENCOUNTER — Telehealth: Payer: Self-pay | Admitting: *Deleted

## 2024-11-19 NOTE — Telephone Encounter (Signed)
 No answer for post procedure call back. Left VM.

## 2024-11-26 LAB — SURGICAL PATHOLOGY

## 2024-11-27 ENCOUNTER — Ambulatory Visit: Payer: Self-pay | Admitting: Gastroenterology

## 2024-11-27 NOTE — Progress Notes (Signed)
 Samari,  The biopsies taken from your stomach were notable for mild chronic gastritis (inflammation) which is a common finding, but there was no evidence of Helicobacter pylori infection. This common finding is not likely to explain nausea and there is no specific treatment or further evaluation recommended. Please continue to institute anti-reflux dietary changes and behavioral modifications to help with your nausea. Follow up as needed in our office.

## 2024-12-10 ENCOUNTER — Encounter: Payer: Self-pay | Admitting: Family Medicine

## 2024-12-12 ENCOUNTER — Ambulatory Visit: Admitting: Family Medicine

## 2024-12-12 ENCOUNTER — Ambulatory Visit: Payer: Self-pay | Admitting: Family Medicine

## 2024-12-12 ENCOUNTER — Encounter: Payer: Self-pay | Admitting: Family Medicine

## 2024-12-12 VITALS — BP 118/70 | HR 56 | Temp 98.1°F | Resp 16 | Ht 67.0 in | Wt 242.8 lb

## 2024-12-12 DIAGNOSIS — R519 Headache, unspecified: Secondary | ICD-10-CM

## 2024-12-12 DIAGNOSIS — R42 Dizziness and giddiness: Secondary | ICD-10-CM

## 2024-12-12 LAB — BASIC METABOLIC PANEL WITH GFR
BUN: 12 mg/dL (ref 6–23)
CO2: 29 meq/L (ref 19–32)
Calcium: 9.5 mg/dL (ref 8.4–10.5)
Chloride: 108 meq/L (ref 96–112)
Creatinine, Ser: 0.69 mg/dL (ref 0.40–1.20)
GFR: 115.77 mL/min
Glucose, Bld: 95 mg/dL (ref 70–99)
Potassium: 3.9 meq/L (ref 3.5–5.1)
Sodium: 141 meq/L (ref 135–145)

## 2024-12-12 LAB — CBC
HCT: 42 % (ref 36.0–46.0)
Hemoglobin: 14.2 g/dL (ref 12.0–15.0)
MCHC: 33.9 g/dL (ref 30.0–36.0)
MCV: 84.8 fl (ref 78.0–100.0)
Platelets: 270 K/uL (ref 150.0–400.0)
RBC: 4.95 Mil/uL (ref 3.87–5.11)
RDW: 13.7 % (ref 11.5–15.5)
WBC: 5.4 K/uL (ref 4.0–10.5)

## 2024-12-12 NOTE — Progress Notes (Signed)
 "  ACUTE VISIT Chief Complaint  Patient presents with   Hypertension    Patient reports when she stands up sometimes she feels head pressure x 6 months    Discussed the use of AI scribe software for clinical note transcription with the patient, who gave verbal consent to proceed.  History of Present Illness Whitney Aguirre is a 34 year old female with a PMHx significant for depression, chronic headaches, vitamin D  deficiency, GERD, hypothyroidism, palpitations, and metabolic syndrome who presents with headache as described above.  She experiences a sensation of head pressure when standing up, described as feeling like her head is going to explode. This has been occurring for over six months and is not dependent on how quickly she stands. The pressure lasts approximately 30 seconds and affects her entire head. No associated visual changes, difficulty swallowing, numbness, tingling, or focal weakness. She also reports dizziness upon standing but no chest pain, difficulty breathing, or palpitations. The head pressure does not occur when transitioning from lying down to sitting, only from sitting to standing and happen a few times most days but no all the time.  She has not started any new medications in the past six months and continues to see her neurologist for headaches, although she has not yet mentioned this new symptom to provider.  Her current medications include duloxetine , levothyroxine , meloxicam , methocarbamol , metoprolol  (taken as needed), pantoprazole , and phentermine /topiramate  ER for weight loss, which she started in December or November. She notes that she has not needed to take metoprolol  as often in the past six months.  In September/2025, she visited the emergency department for palpitations, which was attributed to taking an excessive amount of levothyroxine  following her surgery in August. Her blood pressure readings [facility] have been around 110/74. She  drinks water regularly.  She works as an [employer] and describes her job as stressful, with a high workload. She has been in this position since January 2024.   Lab Results  Component Value Date   NA 138 08/18/2024   CL 105 08/18/2024   K 4.1 08/18/2024   CO2 23 08/18/2024   BUN 9 08/18/2024   CREATININE 0.57 08/18/2024   GFRNONAA >60 08/18/2024   CALCIUM 9.1 08/18/2024   ALBUMIN 4.6 06/20/2024   GLUCOSE 102 (H) 08/18/2024   Lab Results  Component Value Date   WBC 6.8 08/18/2024   HGB 14.3 08/18/2024   HCT 42.1 08/18/2024   MCV 86.4 08/18/2024   PLT 293 08/18/2024   Lab Results  Component Value Date   TSH <0.100 (L) 08/19/2024   Review of Systems  Constitutional:  Negative for activity change, appetite change, chills and fever.  HENT:  Negative for congestion, mouth sores and sore throat.   Eyes:  Negative for pain and visual disturbance.  Respiratory:  Negative for cough and wheezing.   Gastrointestinal:  Negative for abdominal pain, nausea and vomiting.  Endocrine: Negative for cold intolerance and heat intolerance.  Genitourinary:  Negative for decreased urine volume, dysuria and hematuria.  Skin:  Negative for rash.  Neurological:  Negative for syncope and facial asymmetry.  See other pertinent positives and negatives in HPI.  Medications Ordered Prior to Encounter[1]  Past Medical History:  Diagnosis Date   ACL tear 05/12/2014   injured on trampoline   Anemia    Anxiety    Depression    Dysrhythmia    GERD (gastroesophageal reflux disease)    Hashimoto's disease 2018  Headache(784.0)    stress   Heartburn    Hypothyroid    Palpitations    Polycystic ovarian syndrome    no current med.   Thyroid  disease    Allergies[2]  Social History   Socioeconomic History   Marital status: Married    Spouse name: Not on file   Number of children: 0   Years of education: College/RN   Highest education level: GED or equivalent   Occupational History    Employer: [employer]    Comment: ED-RN  Tobacco Use   Smoking status: Never   Smokeless tobacco: Never  Vaping Use   Vaping status: Never Used  Substance and Sexual Activity   Alcohol use: Not Currently    Comment: rare   Drug use: No   Sexual activity: Yes  Other Topics Concern   Not on file  Social History Narrative   Patient lives at home with family.   Caffeine  Use:1-2 cups   Right handed   Social Drivers of Health   Tobacco Use: Low Risk (12/12/2024)   Patient History    Smoking Tobacco Use: Never    Smokeless Tobacco Use: Never    Passive Exposure: Not on file  Financial Resource Strain: Low Risk (12/11/2024)   Overall Financial Resource Strain (CARDIA)    Difficulty of Paying Living Expenses: Not hard at all  Food Insecurity: No Food Insecurity (12/11/2024)   Epic    Worried About Programme Researcher, Broadcasting/film/video in the Last Year: Never true    Ran Out of Food in the Last Year: Never true  Transportation Needs: No Transportation Needs (12/11/2024)   Epic    Lack of Transportation (Medical): No    Lack of Transportation (Non-Medical): No  Physical Activity: Insufficiently Active (12/11/2024)   Exercise Vital Sign    Days of Exercise per Week: 2 days    Minutes of Exercise per Session: 20 min  Stress: No Stress Concern Present (12/11/2024)   [name] of Occupational Health - Occupational Stress Questionnaire    Feeling of Stress: Only a little  Social Connections: Socially Integrated (12/11/2024)   Social Connection and Isolation Panel    Frequency of Communication with Friends and Family: More than three times a week    Frequency of Social Gatherings with Friends and Family: Twice a week    Attends Religious Services: More than 4 times per year    Active Member of Clubs or Organizations: Yes    Attends Banker Meetings: More than 4 times per year    Marital Status: Married  Depression (PHQ2-9): Low Risk (12/12/2024)   Depression  (PHQ2-9)    PHQ-2 Score: 0  Alcohol Screen: Not on file  Housing: Unknown (12/11/2024)   Epic    Unable to Pay for Housing in the Last Year: No    Number of Times Moved in the Last Year: Not on file    Homeless in the Last Year: No  Utilities: Not on file  Health Literacy: Not on file   Vitals:   12/12/24 0740  BP: 118/70  Pulse: (!) 56  Resp: 16  Temp: 98.1 F (36.7 C)  SpO2: 98%   Body mass index is 38.03 kg/m.  Physical Exam Vitals and nursing note reviewed.  Constitutional:      General: She is not in acute distress.    Appearance: She is well-developed.  HENT:     Head: Normocephalic and atraumatic.     Mouth/Throat:  Mouth: Mucous membranes are moist.     Pharynx: Oropharynx is clear.  Eyes:     Conjunctiva/sclera: Conjunctivae normal.  Cardiovascular:     Rate and Rhythm: Regular rhythm. Bradycardia present.     Pulses:          Dorsalis pedis pulses are 2+ on the right side and 2+ on the left side.     Heart sounds: No murmur heard. Pulmonary:     Effort: Pulmonary effort is normal. No respiratory distress.     Breath sounds: Normal breath sounds.  Abdominal:     Palpations: Abdomen is soft. There is no mass.     Tenderness: There is no abdominal tenderness.  Lymphadenopathy:     Cervical: No cervical adenopathy.  Skin:    General: Skin is warm.     Findings: No erythema or rash.  Neurological:     General: No focal deficit present.     Mental Status: She is alert and oriented to person, place, and time.     Cranial Nerves: No cranial nerve deficit.     Motor: No tremor or pronator drift.     Gait: Gait normal.     Deep Tendon Reflexes:     Reflex Scores:      Patellar reflexes are 2+ on the right side and 2+ on the left side. Psychiatric:        Mood and Affect: Mood and affect normal.   ASSESSMENT AND PLAN:  Whitney Aguirre was seen today for hypertension.  Diagnoses and all orders for this visit: Orders Placed This Encounter   Procedures   Basic metabolic panel with GFR   CBC   Lab Results  Component Value Date   NA 141 12/12/2024   CL 108 12/12/2024   K 3.9 12/12/2024   CO2 29 12/12/2024   BUN 12 12/12/2024   CREATININE 0.69 12/12/2024   GFR 115.77 12/12/2024   CALCIUM 9.5 12/12/2024   ALBUMIN 4.6 06/20/2024   GLUCOSE 95 12/12/2024   Lab Results  Component Value Date   WBC 5.4 12/12/2024   HGB 14.2 12/12/2024   HCT 42.0 12/12/2024   MCV 84.8 12/12/2024   PLT 270.0 12/12/2024   Headache, unspecified headache type History of migraine headaches, she follows with neurologist, next appointment in 12/2024.   This headaches happen when standing up from seated position, not all the time, and it lasts about 30 seconds.  She assumes that problem is being caused by elevated BP but has not monitored BP at home.  No history of hypertension, most of her BP readings are in the 110s/70s. We discussed possible etiologies, I do not think we need to have imaging done today but recommend mentioning this new onset headache to her neurologist. Encouraged adequate hydration. Instructed to monitor BP at home in sitting position and when standing up. Monitor for new symptoms. Instructed about warning signs.  -     Basic metabolic panel with GFR; Future -     CBC; Future  Dizziness Mild, associated with above problem. History and examination do not suggest a serious process. Mild bradycardia noted today, she has not been on metoprolol  for a few months. She prefers to hold on rechecking TSH until she sees her endocrinologist. Some of her chronic comorbidities as well as medications could be contributing factors. Will repeat BMP and CBC today. Continue adequate hydration. Follow-up as needed.  -     Basic metabolic panel with GFR; Future -  CBC; Future  Return if symptoms worsen or fail to improve, for keep next appointment.  Gaylynn Seiple G. Zeek Rostron, MD  [facility]. [place] office.     [1]  Current  Outpatient Medications on File Prior to Visit  Medication Sig Dispense Refill   DULoxetine  (CYMBALTA ) 30 MG capsule TAKE 1 CAPSULE BY MOUTH DAILY 30 capsule 0   levothyroxine  (SYNTHROID ) 125 MCG tablet Take 125 mcg by mouth.     meloxicam  (MOBIC ) 15 MG tablet Take 1 tablet (15 mg total) by mouth daily as needed for pain. 30 tablet 1   methocarbamol  (ROBAXIN ) 500 MG tablet Take 1 tablet (500 mg total) by mouth every 8 (eight) hours as needed for muscle spasms. 45 tablet 0   metoprolol  tartrate (LOPRESSOR ) 25 MG tablet Take 0.5 tablets (12.5 mg total) by mouth 2 (two) times daily as needed (for palpitations). 30 tablet 4   norethindrone-ethinyl estradiol -FE (LOESTRIN FE) 1-20 MG-MCG tablet Take 1 tablet by mouth every evening.     ondansetron  (ZOFRAN -ODT) 8 MG disintegrating tablet Take 1 tablet (8 mg total) by mouth every 8 (eight) hours as needed. 20 tablet 1   pantoprazole  (PROTONIX ) 40 MG tablet Take 1 tablet (40 mg total) by mouth daily. 90 tablet 0   Phentermine -Topiramate  ER (QSYMIA ) 3.75-23 MG CP24 1 capsule po qAM with breakfast 14 capsule 0   Phentermine -Topiramate  ER (QSYMIA ) 7.5-46 MG CP24 Take 1 capsule by mouth daily. 30 capsule 0   Vitamin D , Ergocalciferol , (DRISDOL ) 1.25 MG (50000 UNIT) CAPS capsule Take 1 capsule (50,000 Units total) by mouth every 7 (seven) days. 12 capsule 0   No current facility-administered medications on file prior to visit.  [2]  Allergies Allergen Reactions   Chlorhexidine  Hives   "

## 2024-12-12 NOTE — Patient Instructions (Addendum)
 A few things to remember from today's visit:  Headache, unspecified headache type - Plan: Basic metabolic panel with GFR, CBC  Dizziness - Plan: Basic metabolic panel with GFR, CBC Blood pressures sitting and standing up for 2 weeks, am and pm, let me know about readings. Mention this headache to your neurologist. Monitor for new symptoms. Continue adequate hydration.  If you need refills for medications you take chronically, please call your pharmacy. Do not use My Chart to request refills or for acute issues that need immediate attention. If you send a my chart message, it may take a few days to be addressed, specially if I am not in the office.  Please be sure medication list is accurate. If a new problem present, please set up appointment sooner than planned today.

## 2024-12-23 ENCOUNTER — Ambulatory Visit: Admitting: Family Medicine

## 2024-12-25 ENCOUNTER — Encounter: Payer: Self-pay | Admitting: Family Medicine

## 2024-12-25 ENCOUNTER — Ambulatory Visit: Admitting: Family Medicine

## 2024-12-25 VITALS — BP 120/66 | HR 71 | Ht 67.0 in | Wt 237.0 lb

## 2024-12-25 DIAGNOSIS — Z6837 Body mass index (BMI) 37.0-37.9, adult: Secondary | ICD-10-CM

## 2024-12-25 DIAGNOSIS — R12 Heartburn: Secondary | ICD-10-CM

## 2024-12-25 DIAGNOSIS — E88819 Insulin resistance, unspecified: Secondary | ICD-10-CM

## 2024-12-25 DIAGNOSIS — R110 Nausea: Secondary | ICD-10-CM | POA: Diagnosis not present

## 2024-12-25 DIAGNOSIS — E559 Vitamin D deficiency, unspecified: Secondary | ICD-10-CM

## 2024-12-25 MED ORDER — PHENTERMINE-TOPIRAMATE ER 7.5-46 MG PO CP24: 1.0000 | ORAL_CAPSULE | Freq: Every day | ORAL | 1 refills | Status: AC

## 2024-12-25 MED ORDER — PANTOPRAZOLE SODIUM 40 MG PO TBEC: 40.0000 mg | DELAYED_RELEASE_TABLET | Freq: Every day | ORAL | 0 refills | Status: AC

## 2024-12-25 MED ORDER — VITAMIN D (ERGOCALCIFEROL) 1.25 MG (50000 UNIT) PO CAPS: 50000.0000 [IU] | ORAL_CAPSULE | ORAL | 0 refills | Status: AC

## 2024-12-25 NOTE — Progress Notes (Signed)
 "  Office: [phone]  /  Fax: [phone]  WEIGHT SUMMARY AND BIOMETRICS  Starting Date: 09/04/23  Starting Weight: 256lb   Weight Lost Since Last Visit: 6lb   Vitals BP: 120/66 Pulse Rate: 71 SpO2: 97 %   Body Composition  Body Fat %: 43.5 % Fat Mass (lbs): 103 lbs Muscle Mass (lbs): 127.2 lbs Total Body Water (lbs): 92 lbs Visceral Fat Rating : 10    HPI  Chief Complaint: OBESITY  Whitney Aguirre is here to discuss her progress with her obesity treatment plan. She is on the the Category 3 Plan and states she is following her eating plan approximately 0 % of the time. She states she is exercising 0 minutes 0 times per week.  Interval History:  Since last office visit she is down 6 lb She has down 2.8 pounds of muscle mass and down 3.6 pounds of body fat since last visit She did start on Qsymia  since last visit Starting to see improvements in satiety after initiating Qsymia  7.5/46 mg once daily She denies issues with brain fog, vision change, constant patient, dry mouth  He had an endoscopy done 12/23, continue to have issues with morning nausea 4 to 5 days a week and findings of chronic gastritis on EGD. She currently remains on pantoprazole  daily for acid reflux This has not changed much off Zepbound   Pharmacotherapy: Qsymia  7.5/46 mg daily  PHYSICAL EXAM:  Blood pressure 120/66, pulse 71, height 5' 7 (1.702 m), weight 237 lb (107.5 kg), last menstrual period 12/03/2024, SpO2 97%. Body mass index is 37.12 kg/m.  General: She is overweight, cooperative, alert, well developed, and in no acute distress. PSYCH: Has normal mood, affect and thought process.   Lungs: Normal breathing effort, no conversational dyspnea.   ASSESSMENT AND PLAN  TREATMENT PLAN FOR OBESITY:  Recommended Dietary Goals  Whitney Aguirre is currently in the action stage of change. As such, her goal is to continue weight management plan. She has agreed to the Category 3 Plan.  Behavioral  Intervention  We discussed the following Behavioral Modification Strategies today: increasing lean protein intake to established goals, increasing fiber rich foods, increasing water intake , continue to practice mindfulness when eating, planning for success, and small meals, avoid high acid foods.  Additional resources provided today: NA  Recommended Physical Activity Goals  Whitney Aguirre has been advised to work up to 150 minutes of moderate intensity aerobic activity a week and strengthening exercises 2-3 times per week for cardiovascular health, weight loss maintenance and preservation of muscle mass.   She has agreed to [employer] strengthening exercises with a goal of 2-3 sessions a week  and Increase and monitor steps for a goal of 10,000 per day To increase number of daily steps, tracking with a smart watch along with resistance training 2 days a week  Pharmacotherapy changes for the treatment of obesity: None  ASSOCIATED CONDITIONS ADDRESSED TODAY  Heartburn Reviewed EGD results with patient from 11/18/2024, done by Dr. Stacia She has questions about continuing pantoprazole  40 mg once daily which has relieved some of her heartburn but she is still having morning nausea without vomiting.  Findings of chronic gastritis seen on EGD without H. pylori.  Recommend reducing frequency of Excedrin use.  She already avoids alcohol and smoking.  Work on dietary changes as recommended by Dr. Stacia. -     Pantoprazole  Sodium; Take 1 tablet (40 mg total) by mouth daily.  Dispense: 90 tablet; Refill: 0  Morbid obesity (HCC) -  Phentermine -Topiramate  ER; Take 1 capsule by mouth daily.  Dispense: 30 capsule; Refill: 1 Blood pressure and heart rate at goal on Qsymia  7.5/46 mg once daily and starting to see weight reduction and improve satiety.  Will continue this dose of Qsymia .  Avoid pregnancy while on Qsymia  due to risk of teratogenic side effects.  She is currently on OCPs for birth  control.  Patient was counseled on the importance of maintaining healthy lifestyle habits, including balanced nutrition, regular physical activity, and behavioral modifications, while taking antiobesity medication.  Patient verbalized understanding that medication is an adjunct to, not a replacement for, lifestyle changes and that the long-term success and weight maintenance depend on continued adherence to these strategies.  Vitamin D  deficiency Last vitamin D  Lab Results  Component Value Date   VD25OH 33.06 08/11/2024  Currently on vitamin D  50,000 IU once weekly.  Repeat lab next visit  -     Vitamin D  (Ergocalciferol ); Take 1 capsule (50,000 Units total) by mouth every 7 (seven) days.  Dispense: 12 capsule; Refill: 0  BMI 37.0-37.9, adult  Insulin  resistance She has metabolic sequelae of insulin  resistance including PCOS and hepatic steatosis.  Her last fasting insulin  was elevated at 35.4 on 6/5 with associated truncal adiposity.  She is working on dietary change.  She has room for improvement with regular exercise.  Continue metformin .  Nausea Unchanged.  Despite discontinuing Zepbound , she still wakes up in the morning with nausea 3 to 4 days a week which will last for about 30 to 45 minutes.  This is not associate with vomiting.  He has not yet found a food diary to see if any foods are trigger this.  She did have findings of chronic gastritis on EGD from December.  She remains on pantoprazole  40 mg at bedtime but does not see much change in morning nausea.  Begin work on a low acid diet, reducing portion sizes especially at dinnertime and avoid late-night eating.  Recommend keeping water or sparkling water on bedside table to try first thing in the morning.  Follow-up with GI.  Using Zofran  2 to 3 days a week currently.     She was informed of the importance of frequent follow up visits to maximize her success with intensive lifestyle modifications for her multiple health  conditions.   ATTESTASTION STATEMENTS:  Reviewed by clinician on day of visit: allergies, medications, problem list, medical history, surgical history, family history, social history, and previous encounter notes pertinent to obesity diagnosis.   I have personally spent 30 minutes total time today in preparation, patient care, nutritional counseling and education,  and documentation for this visit, including the following: review of most recent clinical lab tests, prescribing medications/ refilling medications, reviewing medical assistant documentation, review and interpretation of bioimpedence results.     Whitney Aguirre, D.O. DABFM, DABOM [facility] [address] [place], [place] [address] [phone] "

## 2024-12-31 ENCOUNTER — Other Ambulatory Visit: Payer: Self-pay | Admitting: Family Medicine

## 2024-12-31 DIAGNOSIS — E88819 Insulin resistance, unspecified: Secondary | ICD-10-CM

## 2024-12-31 MED ORDER — METFORMIN HCL ER 500 MG PO TB24: 500.0000 mg | ORAL_TABLET | Freq: Every day | ORAL | 0 refills | Status: AC

## 2025-01-14 ENCOUNTER — Ambulatory Visit: Admitting: Neurology

## 2025-01-20 ENCOUNTER — Ambulatory Visit: Admitting: Family Medicine
